# Patient Record
Sex: Male | Born: 1960
Health system: Southern US, Community
[De-identification: ages and names within clinical notes are randomized; demographics above are authoritative.]

## PROBLEM LIST (undated history)

## (undated) DIAGNOSIS — R112 Nausea with vomiting, unspecified: Secondary | ICD-10-CM

## (undated) DIAGNOSIS — Z86718 Personal history of other venous thrombosis and embolism: Secondary | ICD-10-CM

## (undated) DIAGNOSIS — T8859XA Other complications of anesthesia, initial encounter: Secondary | ICD-10-CM

## (undated) DIAGNOSIS — M4310 Spondylolisthesis, site unspecified: Secondary | ICD-10-CM

## (undated) DIAGNOSIS — M549 Dorsalgia, unspecified: Secondary | ICD-10-CM

## (undated) DIAGNOSIS — G43909 Migraine, unspecified, not intractable, without status migrainosus: Secondary | ICD-10-CM

## (undated) DIAGNOSIS — E669 Obesity, unspecified: Secondary | ICD-10-CM

## (undated) DIAGNOSIS — Z9889 Other specified postprocedural states: Secondary | ICD-10-CM

## (undated) DIAGNOSIS — M5410 Radiculopathy, site unspecified: Secondary | ICD-10-CM

## (undated) DIAGNOSIS — F32A Depression, unspecified: Secondary | ICD-10-CM

## (undated) DIAGNOSIS — I10 Essential (primary) hypertension: Secondary | ICD-10-CM

## (undated) DIAGNOSIS — G4730 Sleep apnea, unspecified: Secondary | ICD-10-CM

## (undated) DIAGNOSIS — F329 Major depressive disorder, single episode, unspecified: Secondary | ICD-10-CM

## (undated) HISTORY — DX: Personal history of other venous thrombosis and embolism: Z86.718

## (undated) HISTORY — PX: KNEE SURGERY: SHX244

## (undated) HISTORY — PX: HERNIA REPAIR: SHX51

## (undated) HISTORY — DX: Sleep apnea, unspecified: G47.30

## (undated) HISTORY — DX: Obesity, unspecified: E66.9

## (undated) HISTORY — DX: Spondylolisthesis, site unspecified: M43.10

## (undated) HISTORY — DX: Radiculopathy, site unspecified: M54.10

## (undated) HISTORY — DX: Dorsalgia, unspecified: M54.9

## (undated) HISTORY — PX: APPENDECTOMY: SHX54

---

## 2001-06-21 ENCOUNTER — Emergency Department (HOSPITAL_COMMUNITY): Admission: EM | Admit: 2001-06-21 | Discharge: 2001-06-21 | Payer: Self-pay | Admitting: Emergency Medicine

## 2001-06-21 ENCOUNTER — Encounter: Payer: Self-pay | Admitting: Emergency Medicine

## 2001-06-23 ENCOUNTER — Emergency Department (HOSPITAL_COMMUNITY): Admission: EM | Admit: 2001-06-23 | Discharge: 2001-06-23 | Payer: Self-pay | Admitting: *Deleted

## 2001-06-23 ENCOUNTER — Encounter: Payer: Self-pay | Admitting: *Deleted

## 2001-07-04 ENCOUNTER — Ambulatory Visit (HOSPITAL_COMMUNITY): Admission: RE | Admit: 2001-07-04 | Discharge: 2001-07-04 | Payer: Self-pay | Admitting: Family Medicine

## 2001-07-04 ENCOUNTER — Encounter: Payer: Self-pay | Admitting: Family Medicine

## 2001-07-08 ENCOUNTER — Inpatient Hospital Stay (HOSPITAL_COMMUNITY): Admission: RE | Admit: 2001-07-08 | Discharge: 2001-07-14 | Payer: Self-pay | Admitting: Family Medicine

## 2001-07-11 ENCOUNTER — Encounter: Payer: Self-pay | Admitting: Internal Medicine

## 2001-07-11 ENCOUNTER — Encounter: Payer: Self-pay | Admitting: Family Medicine

## 2001-07-24 ENCOUNTER — Encounter (HOSPITAL_COMMUNITY): Admission: RE | Admit: 2001-07-24 | Discharge: 2001-07-24 | Payer: Self-pay | Admitting: Family Medicine

## 2005-01-05 ENCOUNTER — Inpatient Hospital Stay (HOSPITAL_COMMUNITY): Admission: EM | Admit: 2005-01-05 | Discharge: 2005-01-09 | Payer: Self-pay | Admitting: Emergency Medicine

## 2006-03-18 ENCOUNTER — Inpatient Hospital Stay (HOSPITAL_COMMUNITY): Admission: RE | Admit: 2006-03-18 | Discharge: 2006-03-21 | Payer: Self-pay | Admitting: Preventative Medicine

## 2006-03-24 ENCOUNTER — Encounter (INDEPENDENT_AMBULATORY_CARE_PROVIDER_SITE_OTHER): Payer: Self-pay | Admitting: Family Medicine

## 2006-03-24 ENCOUNTER — Ambulatory Visit: Payer: Self-pay | Admitting: *Deleted

## 2006-03-31 ENCOUNTER — Ambulatory Visit: Payer: Self-pay | Admitting: *Deleted

## 2006-04-15 ENCOUNTER — Ambulatory Visit: Payer: Self-pay | Admitting: *Deleted

## 2006-05-16 ENCOUNTER — Ambulatory Visit: Payer: Self-pay | Admitting: *Deleted

## 2006-06-15 ENCOUNTER — Ambulatory Visit: Payer: Self-pay | Admitting: *Deleted

## 2006-07-18 ENCOUNTER — Encounter (INDEPENDENT_AMBULATORY_CARE_PROVIDER_SITE_OTHER): Payer: Self-pay | Admitting: Family Medicine

## 2006-07-18 LAB — CONVERTED CEMR LAB: TSH: 1.95 microintl units/mL

## 2006-07-19 ENCOUNTER — Ambulatory Visit: Payer: Self-pay | Admitting: Cardiology

## 2006-07-26 ENCOUNTER — Ambulatory Visit: Payer: Self-pay | Admitting: Family Medicine

## 2006-07-27 ENCOUNTER — Encounter (INDEPENDENT_AMBULATORY_CARE_PROVIDER_SITE_OTHER): Payer: Self-pay | Admitting: Family Medicine

## 2006-07-27 LAB — CONVERTED CEMR LAB: WBC, blood: 7.2 10*3/uL

## 2006-08-11 ENCOUNTER — Ambulatory Visit: Payer: Self-pay | Admitting: Family Medicine

## 2006-08-11 ENCOUNTER — Ambulatory Visit: Payer: Self-pay | Admitting: Cardiology

## 2006-08-12 ENCOUNTER — Encounter (INDEPENDENT_AMBULATORY_CARE_PROVIDER_SITE_OTHER): Payer: Self-pay | Admitting: Family Medicine

## 2006-08-25 ENCOUNTER — Ambulatory Visit: Payer: Self-pay | Admitting: Family Medicine

## 2006-09-13 ENCOUNTER — Ambulatory Visit: Payer: Self-pay | Admitting: Cardiovascular Disease

## 2006-09-22 ENCOUNTER — Encounter: Payer: Self-pay | Admitting: Family Medicine

## 2006-09-22 DIAGNOSIS — E669 Obesity, unspecified: Secondary | ICD-10-CM

## 2006-09-22 DIAGNOSIS — Z86718 Personal history of other venous thrombosis and embolism: Secondary | ICD-10-CM

## 2006-09-22 DIAGNOSIS — F329 Major depressive disorder, single episode, unspecified: Secondary | ICD-10-CM

## 2006-09-22 DIAGNOSIS — I10 Essential (primary) hypertension: Secondary | ICD-10-CM

## 2006-09-27 ENCOUNTER — Encounter (INDEPENDENT_AMBULATORY_CARE_PROVIDER_SITE_OTHER): Payer: Self-pay | Admitting: Family Medicine

## 2006-10-18 DIAGNOSIS — Z86718 Personal history of other venous thrombosis and embolism: Secondary | ICD-10-CM

## 2006-10-18 HISTORY — DX: Personal history of other venous thrombosis and embolism: Z86.718

## 2006-10-26 ENCOUNTER — Ambulatory Visit: Payer: Self-pay | Admitting: Family Medicine

## 2006-10-26 ENCOUNTER — Ambulatory Visit: Payer: Self-pay | Admitting: Cardiovascular Disease

## 2006-11-16 ENCOUNTER — Ambulatory Visit: Payer: Self-pay | Admitting: Cardiology

## 2006-12-07 ENCOUNTER — Ambulatory Visit: Payer: Self-pay | Admitting: Family Medicine

## 2006-12-07 LAB — CONVERTED CEMR LAB: Hemoglobin: 14.4 g/dL

## 2006-12-17 ENCOUNTER — Encounter (INDEPENDENT_AMBULATORY_CARE_PROVIDER_SITE_OTHER): Payer: Self-pay | Admitting: Family Medicine

## 2006-12-17 ENCOUNTER — Ambulatory Visit: Admission: RE | Admit: 2006-12-17 | Discharge: 2006-12-17 | Payer: Self-pay | Admitting: Family Medicine

## 2006-12-22 ENCOUNTER — Ambulatory Visit: Payer: Self-pay | Admitting: Pulmonary Disease

## 2006-12-27 ENCOUNTER — Encounter (INDEPENDENT_AMBULATORY_CARE_PROVIDER_SITE_OTHER): Payer: Self-pay | Admitting: Family Medicine

## 2007-01-04 ENCOUNTER — Ambulatory Visit: Payer: Self-pay | Admitting: Family Medicine

## 2007-01-18 ENCOUNTER — Ambulatory Visit: Payer: Self-pay | Admitting: Family Medicine

## 2007-01-18 DIAGNOSIS — G4733 Obstructive sleep apnea (adult) (pediatric): Secondary | ICD-10-CM

## 2007-01-20 ENCOUNTER — Encounter (INDEPENDENT_AMBULATORY_CARE_PROVIDER_SITE_OTHER): Payer: Self-pay | Admitting: Family Medicine

## 2007-02-08 ENCOUNTER — Telehealth (INDEPENDENT_AMBULATORY_CARE_PROVIDER_SITE_OTHER): Payer: Self-pay | Admitting: *Deleted

## 2007-02-22 ENCOUNTER — Ambulatory Visit: Payer: Self-pay | Admitting: Cardiovascular Disease

## 2007-02-22 ENCOUNTER — Telehealth (INDEPENDENT_AMBULATORY_CARE_PROVIDER_SITE_OTHER): Payer: Self-pay | Admitting: Family Medicine

## 2007-02-28 ENCOUNTER — Encounter (INDEPENDENT_AMBULATORY_CARE_PROVIDER_SITE_OTHER): Payer: Self-pay | Admitting: Family Medicine

## 2007-03-07 ENCOUNTER — Ambulatory Visit: Payer: Self-pay | Admitting: Family Medicine

## 2007-03-27 ENCOUNTER — Ambulatory Visit: Payer: Self-pay | Admitting: Internal Medicine

## 2007-03-28 ENCOUNTER — Encounter (INDEPENDENT_AMBULATORY_CARE_PROVIDER_SITE_OTHER): Payer: Self-pay | Admitting: Family Medicine

## 2007-04-18 ENCOUNTER — Ambulatory Visit: Payer: Self-pay | Admitting: Family Medicine

## 2007-04-19 ENCOUNTER — Ambulatory Visit (HOSPITAL_COMMUNITY): Admission: RE | Admit: 2007-04-19 | Discharge: 2007-04-19 | Payer: Self-pay | Admitting: Family Medicine

## 2007-04-19 ENCOUNTER — Encounter (INDEPENDENT_AMBULATORY_CARE_PROVIDER_SITE_OTHER): Payer: Self-pay | Admitting: Family Medicine

## 2007-04-20 ENCOUNTER — Telehealth (INDEPENDENT_AMBULATORY_CARE_PROVIDER_SITE_OTHER): Payer: Self-pay | Admitting: *Deleted

## 2007-04-20 LAB — CONVERTED CEMR LAB
ALT: 14 units/L (ref 0–53)
Alkaline Phosphatase: 80 units/L (ref 39–117)
BUN: 16 mg/dL (ref 6–23)
Cholesterol: 196 mg/dL (ref 0–200)
Glucose, Bld: 105 mg/dL — ABNORMAL HIGH (ref 70–99)
LDL Cholesterol: 145 mg/dL — ABNORMAL HIGH (ref 0–99)
Sodium: 141 meq/L (ref 135–145)
Total CHOL/HDL Ratio: 5.3
Total Protein: 6.5 g/dL (ref 6.0–8.3)
Triglycerides: 72 mg/dL (ref <150)
VLDL: 14 mg/dL (ref 0–40)

## 2007-04-26 ENCOUNTER — Encounter (INDEPENDENT_AMBULATORY_CARE_PROVIDER_SITE_OTHER): Payer: Self-pay | Admitting: Family Medicine

## 2007-05-01 ENCOUNTER — Ambulatory Visit: Payer: Self-pay | Admitting: Family Medicine

## 2007-05-01 ENCOUNTER — Ambulatory Visit: Payer: Self-pay | Admitting: Cardiology

## 2007-05-03 ENCOUNTER — Ambulatory Visit: Payer: Self-pay | Admitting: Family Medicine

## 2007-05-03 DIAGNOSIS — E785 Hyperlipidemia, unspecified: Secondary | ICD-10-CM | POA: Insufficient documentation

## 2007-05-03 DIAGNOSIS — F329 Major depressive disorder, single episode, unspecified: Secondary | ICD-10-CM

## 2007-05-05 ENCOUNTER — Ambulatory Visit: Payer: Self-pay | Admitting: Family Medicine

## 2007-05-06 ENCOUNTER — Encounter (INDEPENDENT_AMBULATORY_CARE_PROVIDER_SITE_OTHER): Payer: Self-pay | Admitting: Family Medicine

## 2007-05-08 ENCOUNTER — Ambulatory Visit: Payer: Self-pay | Admitting: Family Medicine

## 2007-05-10 ENCOUNTER — Telehealth (INDEPENDENT_AMBULATORY_CARE_PROVIDER_SITE_OTHER): Payer: Self-pay | Admitting: Family Medicine

## 2007-05-11 ENCOUNTER — Encounter (INDEPENDENT_AMBULATORY_CARE_PROVIDER_SITE_OTHER): Payer: Self-pay | Admitting: Family Medicine

## 2007-05-11 ENCOUNTER — Ambulatory Visit: Payer: Self-pay | Admitting: Cardiology

## 2007-05-12 ENCOUNTER — Encounter (INDEPENDENT_AMBULATORY_CARE_PROVIDER_SITE_OTHER): Payer: Self-pay | Admitting: Family Medicine

## 2007-05-12 ENCOUNTER — Ambulatory Visit (HOSPITAL_COMMUNITY): Admission: RE | Admit: 2007-05-12 | Discharge: 2007-05-12 | Payer: Self-pay | Admitting: Family Medicine

## 2007-05-15 ENCOUNTER — Ambulatory Visit: Payer: Self-pay | Admitting: Family Medicine

## 2007-05-16 ENCOUNTER — Encounter (INDEPENDENT_AMBULATORY_CARE_PROVIDER_SITE_OTHER): Payer: Self-pay | Admitting: Family Medicine

## 2007-05-23 ENCOUNTER — Telehealth (INDEPENDENT_AMBULATORY_CARE_PROVIDER_SITE_OTHER): Payer: Self-pay | Admitting: *Deleted

## 2007-05-23 ENCOUNTER — Ambulatory Visit: Payer: Self-pay | Admitting: Family Medicine

## 2007-05-25 ENCOUNTER — Ambulatory Visit: Payer: Self-pay | Admitting: Cardiology

## 2007-05-30 ENCOUNTER — Encounter (INDEPENDENT_AMBULATORY_CARE_PROVIDER_SITE_OTHER): Payer: Self-pay | Admitting: Family Medicine

## 2007-06-05 ENCOUNTER — Encounter (INDEPENDENT_AMBULATORY_CARE_PROVIDER_SITE_OTHER): Payer: Self-pay | Admitting: Family Medicine

## 2007-06-09 ENCOUNTER — Ambulatory Visit (HOSPITAL_COMMUNITY): Admission: RE | Admit: 2007-06-09 | Discharge: 2007-06-09 | Payer: Self-pay | Admitting: Unknown Physician Specialty

## 2007-06-09 ENCOUNTER — Ambulatory Visit: Payer: Self-pay | Admitting: Cardiology

## 2007-06-09 ENCOUNTER — Encounter (INDEPENDENT_AMBULATORY_CARE_PROVIDER_SITE_OTHER): Payer: Self-pay | Admitting: Family Medicine

## 2007-06-13 ENCOUNTER — Telehealth (INDEPENDENT_AMBULATORY_CARE_PROVIDER_SITE_OTHER): Payer: Self-pay | Admitting: Family Medicine

## 2007-06-13 ENCOUNTER — Ambulatory Visit (HOSPITAL_COMMUNITY): Payer: Self-pay | Admitting: Psychiatry

## 2007-06-15 ENCOUNTER — Encounter (INDEPENDENT_AMBULATORY_CARE_PROVIDER_SITE_OTHER): Payer: Self-pay | Admitting: *Deleted

## 2007-06-15 ENCOUNTER — Telehealth (INDEPENDENT_AMBULATORY_CARE_PROVIDER_SITE_OTHER): Payer: Self-pay | Admitting: Family Medicine

## 2007-06-21 ENCOUNTER — Encounter (INDEPENDENT_AMBULATORY_CARE_PROVIDER_SITE_OTHER): Payer: Self-pay | Admitting: Family Medicine

## 2007-06-22 ENCOUNTER — Ambulatory Visit: Payer: Self-pay | Admitting: Cardiology

## 2007-06-27 ENCOUNTER — Encounter (INDEPENDENT_AMBULATORY_CARE_PROVIDER_SITE_OTHER): Payer: Self-pay | Admitting: Family Medicine

## 2007-06-28 ENCOUNTER — Ambulatory Visit: Payer: Self-pay | Admitting: Family Medicine

## 2007-07-04 ENCOUNTER — Ambulatory Visit: Payer: Self-pay | Admitting: Family Medicine

## 2007-11-01 ENCOUNTER — Encounter (INDEPENDENT_AMBULATORY_CARE_PROVIDER_SITE_OTHER): Payer: Self-pay | Admitting: Family Medicine

## 2007-12-27 ENCOUNTER — Ambulatory Visit: Payer: Self-pay | Admitting: Family Medicine

## 2007-12-27 ENCOUNTER — Telehealth (INDEPENDENT_AMBULATORY_CARE_PROVIDER_SITE_OTHER): Payer: Self-pay | Admitting: Family Medicine

## 2007-12-27 ENCOUNTER — Ambulatory Visit (HOSPITAL_COMMUNITY): Admission: RE | Admit: 2007-12-27 | Discharge: 2007-12-27 | Payer: Self-pay | Admitting: Family Medicine

## 2007-12-27 ENCOUNTER — Telehealth (INDEPENDENT_AMBULATORY_CARE_PROVIDER_SITE_OTHER): Payer: Self-pay | Admitting: *Deleted

## 2007-12-28 ENCOUNTER — Telehealth (INDEPENDENT_AMBULATORY_CARE_PROVIDER_SITE_OTHER): Payer: Self-pay | Admitting: *Deleted

## 2007-12-29 ENCOUNTER — Ambulatory Visit: Payer: Self-pay | Admitting: Family Medicine

## 2007-12-29 DIAGNOSIS — R718 Other abnormality of red blood cells: Secondary | ICD-10-CM | POA: Insufficient documentation

## 2007-12-30 ENCOUNTER — Encounter (INDEPENDENT_AMBULATORY_CARE_PROVIDER_SITE_OTHER): Payer: Self-pay | Admitting: Family Medicine

## 2007-12-30 LAB — CONVERTED CEMR LAB: Saturation Ratios: 15 % — ABNORMAL LOW (ref 20–55)

## 2008-01-01 ENCOUNTER — Telehealth (INDEPENDENT_AMBULATORY_CARE_PROVIDER_SITE_OTHER): Payer: Self-pay | Admitting: *Deleted

## 2008-01-02 ENCOUNTER — Encounter (INDEPENDENT_AMBULATORY_CARE_PROVIDER_SITE_OTHER): Payer: Self-pay | Admitting: Family Medicine

## 2008-01-03 ENCOUNTER — Telehealth (INDEPENDENT_AMBULATORY_CARE_PROVIDER_SITE_OTHER): Payer: Self-pay | Admitting: *Deleted

## 2008-01-12 ENCOUNTER — Ambulatory Visit: Payer: Self-pay | Admitting: Family Medicine

## 2008-01-17 ENCOUNTER — Encounter (INDEPENDENT_AMBULATORY_CARE_PROVIDER_SITE_OTHER): Payer: Self-pay | Admitting: Family Medicine

## 2008-01-29 ENCOUNTER — Encounter (INDEPENDENT_AMBULATORY_CARE_PROVIDER_SITE_OTHER): Payer: Self-pay | Admitting: Family Medicine

## 2008-01-30 ENCOUNTER — Encounter: Payer: Self-pay | Admitting: Orthopedic Surgery

## 2008-02-01 ENCOUNTER — Telehealth (INDEPENDENT_AMBULATORY_CARE_PROVIDER_SITE_OTHER): Payer: Self-pay | Admitting: Family Medicine

## 2008-02-23 ENCOUNTER — Encounter (INDEPENDENT_AMBULATORY_CARE_PROVIDER_SITE_OTHER): Payer: Self-pay | Admitting: Family Medicine

## 2008-03-08 ENCOUNTER — Telehealth (INDEPENDENT_AMBULATORY_CARE_PROVIDER_SITE_OTHER): Payer: Self-pay | Admitting: Family Medicine

## 2008-03-26 ENCOUNTER — Telehealth (INDEPENDENT_AMBULATORY_CARE_PROVIDER_SITE_OTHER): Payer: Self-pay | Admitting: *Deleted

## 2008-03-26 ENCOUNTER — Ambulatory Visit: Payer: Self-pay | Admitting: Family Medicine

## 2008-03-28 ENCOUNTER — Encounter (INDEPENDENT_AMBULATORY_CARE_PROVIDER_SITE_OTHER): Payer: Self-pay | Admitting: Family Medicine

## 2008-04-15 ENCOUNTER — Observation Stay (HOSPITAL_COMMUNITY): Admission: EM | Admit: 2008-04-15 | Discharge: 2008-04-17 | Payer: Self-pay | Admitting: Emergency Medicine

## 2008-04-15 ENCOUNTER — Encounter: Payer: Self-pay | Admitting: Orthopedic Surgery

## 2008-04-16 ENCOUNTER — Encounter: Payer: Self-pay | Admitting: Orthopedic Surgery

## 2008-04-17 ENCOUNTER — Ambulatory Visit: Payer: Self-pay | Admitting: Orthopedic Surgery

## 2008-04-17 ENCOUNTER — Telehealth (INDEPENDENT_AMBULATORY_CARE_PROVIDER_SITE_OTHER): Payer: Self-pay | Admitting: Family Medicine

## 2008-04-20 ENCOUNTER — Encounter: Payer: Self-pay | Admitting: Orthopedic Surgery

## 2008-04-23 ENCOUNTER — Ambulatory Visit: Payer: Self-pay | Admitting: Family Medicine

## 2008-04-24 ENCOUNTER — Ambulatory Visit: Payer: Self-pay | Admitting: Orthopedic Surgery

## 2008-04-25 ENCOUNTER — Encounter: Payer: Self-pay | Admitting: Orthopedic Surgery

## 2008-05-01 ENCOUNTER — Ambulatory Visit: Payer: Self-pay | Admitting: Orthopedic Surgery

## 2008-05-02 ENCOUNTER — Telehealth: Payer: Self-pay | Admitting: Orthopedic Surgery

## 2008-05-02 ENCOUNTER — Encounter: Payer: Self-pay | Admitting: Orthopedic Surgery

## 2008-05-08 ENCOUNTER — Ambulatory Visit (HOSPITAL_COMMUNITY): Admission: RE | Admit: 2008-05-08 | Discharge: 2008-05-08 | Payer: Self-pay | Admitting: Orthopedic Surgery

## 2008-05-15 ENCOUNTER — Ambulatory Visit: Payer: Self-pay | Admitting: Orthopedic Surgery

## 2008-05-17 ENCOUNTER — Encounter: Payer: Self-pay | Admitting: Orthopedic Surgery

## 2008-05-21 ENCOUNTER — Ambulatory Visit: Payer: Self-pay | Admitting: Family Medicine

## 2008-06-04 ENCOUNTER — Telehealth: Payer: Self-pay | Admitting: Orthopedic Surgery

## 2008-06-11 ENCOUNTER — Encounter: Payer: Self-pay | Admitting: Orthopedic Surgery

## 2008-06-12 ENCOUNTER — Ambulatory Visit: Payer: Self-pay | Admitting: Orthopedic Surgery

## 2008-06-17 ENCOUNTER — Encounter: Payer: Self-pay | Admitting: Orthopedic Surgery

## 2008-06-26 ENCOUNTER — Ambulatory Visit: Payer: Self-pay | Admitting: Family Medicine

## 2008-08-22 ENCOUNTER — Ambulatory Visit: Payer: Self-pay | Admitting: Family Medicine

## 2008-08-28 DIAGNOSIS — I82409 Acute embolism and thrombosis of unspecified deep veins of unspecified lower extremity: Secondary | ICD-10-CM | POA: Insufficient documentation

## 2008-08-28 DIAGNOSIS — F17200 Nicotine dependence, unspecified, uncomplicated: Secondary | ICD-10-CM | POA: Insufficient documentation

## 2008-09-19 ENCOUNTER — Telehealth (INDEPENDENT_AMBULATORY_CARE_PROVIDER_SITE_OTHER): Payer: Self-pay | Admitting: *Deleted

## 2008-11-21 ENCOUNTER — Ambulatory Visit: Payer: Self-pay | Admitting: Family Medicine

## 2009-01-08 ENCOUNTER — Ambulatory Visit: Payer: Self-pay | Admitting: Internal Medicine

## 2009-01-09 ENCOUNTER — Ambulatory Visit: Payer: Self-pay | Admitting: Family Medicine

## 2009-01-09 DIAGNOSIS — R55 Syncope and collapse: Secondary | ICD-10-CM

## 2009-01-10 ENCOUNTER — Ambulatory Visit (HOSPITAL_COMMUNITY): Admission: RE | Admit: 2009-01-10 | Discharge: 2009-01-10 | Payer: Self-pay | Admitting: Family Medicine

## 2009-01-13 ENCOUNTER — Ambulatory Visit: Payer: Self-pay | Admitting: Family Medicine

## 2009-01-14 ENCOUNTER — Telehealth (INDEPENDENT_AMBULATORY_CARE_PROVIDER_SITE_OTHER): Payer: Self-pay | Admitting: Family Medicine

## 2009-01-14 ENCOUNTER — Encounter (INDEPENDENT_AMBULATORY_CARE_PROVIDER_SITE_OTHER): Payer: Self-pay | Admitting: Family Medicine

## 2009-01-15 ENCOUNTER — Encounter (INDEPENDENT_AMBULATORY_CARE_PROVIDER_SITE_OTHER): Payer: Self-pay | Admitting: Family Medicine

## 2009-01-15 ENCOUNTER — Telehealth (INDEPENDENT_AMBULATORY_CARE_PROVIDER_SITE_OTHER): Payer: Self-pay | Admitting: Family Medicine

## 2009-01-16 ENCOUNTER — Encounter (INDEPENDENT_AMBULATORY_CARE_PROVIDER_SITE_OTHER): Payer: Self-pay | Admitting: Family Medicine

## 2009-01-16 ENCOUNTER — Telehealth (INDEPENDENT_AMBULATORY_CARE_PROVIDER_SITE_OTHER): Payer: Self-pay | Admitting: Family Medicine

## 2009-01-20 ENCOUNTER — Encounter (INDEPENDENT_AMBULATORY_CARE_PROVIDER_SITE_OTHER): Payer: Self-pay | Admitting: Family Medicine

## 2009-01-21 ENCOUNTER — Telehealth (INDEPENDENT_AMBULATORY_CARE_PROVIDER_SITE_OTHER): Payer: Self-pay | Admitting: *Deleted

## 2009-01-22 ENCOUNTER — Encounter (INDEPENDENT_AMBULATORY_CARE_PROVIDER_SITE_OTHER): Payer: Self-pay | Admitting: Family Medicine

## 2009-01-23 ENCOUNTER — Encounter (INDEPENDENT_AMBULATORY_CARE_PROVIDER_SITE_OTHER): Payer: Self-pay | Admitting: Family Medicine

## 2009-03-21 ENCOUNTER — Encounter (INDEPENDENT_AMBULATORY_CARE_PROVIDER_SITE_OTHER): Payer: Self-pay | Admitting: Family Medicine

## 2009-07-09 ENCOUNTER — Encounter: Payer: Self-pay | Admitting: Cardiology

## 2009-12-29 ENCOUNTER — Ambulatory Visit (HOSPITAL_COMMUNITY): Admission: RE | Admit: 2009-12-29 | Discharge: 2009-12-29 | Payer: Self-pay | Admitting: Family Medicine

## 2010-01-20 ENCOUNTER — Encounter (HOSPITAL_COMMUNITY): Admission: RE | Admit: 2010-01-20 | Discharge: 2010-02-19 | Payer: Self-pay | Admitting: Podiatry

## 2010-06-19 ENCOUNTER — Ambulatory Visit: Admission: RE | Admit: 2010-06-19 | Discharge: 2010-06-19 | Payer: Self-pay | Admitting: Family Medicine

## 2010-11-15 LAB — CONVERTED CEMR LAB
Albumin: 4.1 g/dL (ref 3.5–5.2)
Alkaline Phosphatase: 100 units/L (ref 39–117)
Amylase: 42 units/L (ref 27–131)
Basophils Absolute: 0 10*3/uL (ref 0.0–0.1)
Basophils Relative: 0 % (ref 0–1)
Bilirubin Urine: NEGATIVE
CO2: 26 meq/L (ref 19–32)
Cholesterol: 197 mg/dL (ref 0–200)
Eosinophils Absolute: 0 10*3/uL (ref 0.0–0.7)
Glucose, Bld: 120 mg/dL — ABNORMAL HIGH (ref 70–99)
Glucose, Urine, Semiquant: NEGATIVE
HDL goal, serum: 40 mg/dL
Hemoglobin: 14.4 g/dL (ref 13.0–17.0)
LDL Cholesterol: 144 mg/dL — ABNORMAL HIGH (ref 0–99)
LDL Goal: 130 mg/dL
Lymphocytes Relative: 25 % (ref 12–46)
MCV: 70.5 fL — ABNORMAL LOW (ref 78.0–100.0)
Monocytes Absolute: 0.5 10*3/uL (ref 0.1–1.0)
Monocytes Relative: 7 % (ref 3–12)
Platelets: 223 10*3/uL (ref 150–400)
Potassium: 4.2 meq/L (ref 3.5–5.3)
Specific Gravity, Urine: 1.025
Total CHOL/HDL Ratio: 5.2
Triglycerides: 76 mg/dL (ref <150)
VLDL: 15 mg/dL (ref 0–40)
WBC Urine, dipstick: NEGATIVE
WBC: 6.4 10*3/uL (ref 4.0–10.5)

## 2010-11-19 NOTE — Letter (Signed)
Summary: Attending physicans statement  Attending physicans statement   Imported By: Konrad Dolores 06/27/2007 14:01:48  _____________________________________________________________________  External Attachment:    Type:   Image     Comment:   External Document

## 2011-03-02 NOTE — H&P (Signed)
[name], [name]               ACCOUNT NO.:  [id number]   MEDICAL RECORD NO.:  [id number]          PATIENT TYPE:  INP   LOCATION:  A309                          FACILITY:  [facility]   PHYSICIAN:  Salem Caster, DO    DATE OF BIRTH:  01-25-1961   DATE OF ADMISSION:  04/15/2008  DATE OF DISCHARGE:  LH                              HISTORY & PHYSICAL   PRIMARY CARE PHYSICIAN:  Weston Settle, M.D.   CHIEF COMPLAINT:  Right arm pain.   HISTORY OF THE PRESENT ILLNESS:  This is a 50 year old African-American  male who presents with the complaint of right arm pain.  The patient  states that he has a history of DVT in his right upper arm over a year  ago after being seen at [facility] for a migraine.  The patient states he had  an IV placed in that arm and developed a DVT in that arm.  The patient  was placed on Coumadin, that he states, for almost a year and now he has  been off Coumadin for approximately eight months.  The patient states  that he developed pain above the elbow that radiates all the way down to  his wrist area.  He states this is on the medial aspect of his arm and  got progressively worse.  The patient states the pain was 10/10 upon  admission and after pain M.D. he states it is approximately a 6/10.  The  patient also admits to some mild tingling in his right fingers.  The  patient denies any swelling in that extremity at this time.   PAST MEDICAL HISTORY:  The patient's past medical history includes:  1. Hypertension.  2. Migraine headaches.  3. Right arm DVT.   PAST SURGICAL HISTORY:  1. Appendectomy.  2. Left hernia repair.  3. Right knee surgery.   ALLERGIES:  Drug allergies are none.   SOCIAL HISTORY:  The patient is a half-pack per day smoker since 89.  Denies any alcohol or drug abuse.   FAMILY HISTORY:  The family history is positive for cancer, stroke,  myocardial infarction and diabetes.   MEDICATIONS:  The patient's home medications include:  1.  Cardizem 240 mg daily.  2. Alprazolam 0.5 mg at bedtime.  3. Unknown migraine medication.   REVIEW OF SYSTEMS:  CONSTITUTIONAL:  Negative for fever, chills, weight  loss and weight gain.  HEENT:  Unremarkable.  CARDIOVASCULAR:  No chest  pain or palpitations.  RESPIRATORY:  No cough, dyspnea or wheezing.  GASTROINTESTINAL:  No nausea, vomiting, diarrhea or abdominal pain.  GENITOURINARY:  No dysuria or flank pain.  MUSCULOSKELETAL:  Positive  for right arm pain.  No neck or back pain.  INTEGUMENT:  No rashes or  moles.  NEUROLOGIC:  No current headache.   PHYSICAL EXAMINATION:  VITAL SIGNS:  Temperature is 98, pulse is 61,  respirations are 14, blood pressure is 134/93, and he is satting 96% on  room air.  GENERAL APPEARANCE:  The patient is well-developed, well-nourished and  well-hydrated, and in no acute distress.  The patient is complaining of  arm pain.  HEENT:  The head is atraumatic and normocephalic.  No scleral icterus.  Eyes show PERRLA.  EOMI.  NECK:  The neck is soft, supple, nontender and nondistended with no  thyromegaly.  No JVD is noted.  HEART:  Cardiovascular shows regular rate and rhythm.  No murmurs, rubs  or gallops.  CHEST AND LUNGS:  The lungs are clear to auscultation bilaterally.  No  rales, rhonchi or wheezes.  ABDOMEN:  The abdomen is soft, nontender and nondistended with positive  bowel sounds.  No rigidity or guarding.  EXTREMITIES:  The extremities have no clubbing, cyanosis or edema in his  lower extremities.  The right upper extremity has pain on palpation in  the medial aspect of his right forearm.  No obvious swelling, streaking  or redness is noted.  He does have pain on palpation as stated in the  medial aspect of the forearm.  NEUROLOGIC EXAMINATION:  Neurologically cranial nerves II-XII are  grossly intact.  The is alert and oriented times three.   LABORATORY DATA:  Sodium 140, potassium 3.9, chloride 105, BUN 15,  creatinine 1.6, and glucose  80.  Hemoglobin 16.3 and hematocrit 48.0.  CT scan was performed of his chest and arm, and no pulmonary embolus was  noted.  This was a suboptimal study for DVT of the right arm with no  secondary signs of DVT being seen, recommend ultrasound.   ASSESSMENT:  1. Right arm pain.  2. History of deep venous thrombosis in the right upper extremity.  3. History of hypertension.  4. History of migraines.   PLAN:  1. The patient is admitted to the service of [facility] to a general      medical bed.  2. For his right arm pain the patient will be placed on IV pain      medications at this time.  3. Also we will get an ultrasound of his right upper extremity in the      A.M. to rule out DVT.  4. The patient will be placed on Lovenox 1 mg/kg every 12 hours until      DVT is ruled out of his right upper extremity.  5. For his hypertension and migraine the patient will be placed on his      home medications when available.      Salem Caster, DO  Electronically Signed     SM/MEDQ  D:  04/16/2008  T:  04/16/2008  Job:  [id number]

## 2011-03-02 NOTE — Discharge Summary (Signed)
[name], [name]               ACCOUNT NO.:  [id number]   MEDICAL RECORD NO.:  [id number]          PATIENT TYPE:  OBV   LOCATION:  A309                          FACILITY:  [facility]   PHYSICIAN:  Girard Cooter, MD         DATE OF BIRTH:  1961-02-22   DATE OF ADMISSION:  04/15/2008  DATE OF DISCHARGE:  07/01/2009LH                               DISCHARGE SUMMARY   DISCHARGE DIAGNOSES:  1. Right arm pain.  2. History of deep vein thrombosis in the right arm.  3. Deep vein thrombosis ruled out.  4. Hypertension.  5. History of migraines.  6. Likely tennis elbow per orthopedic consultation.   CONSULTANTS:  Dr. Arther Abbott, orthopedic surgical services.   PROCEDURES:  1. The patient had a CT angio on April 15, 2008 which showed no      evidence of pulmonary embolism.  Suboptimal evaluation of right arm      DVT due to poor venous opacification.  No secondary signs of acute      DVT noted.  Nonemergent right upper extremity venous Doppler      ultrasound recommend for further evaluation.  2. The patient had an ultrasound venous imaging of the upper extremity      which showed no evidence of DVT in the right upper extremity,      questionable mild elevation of the right heart pressure.  The      patient had an x-ray of the elbow April 16, 2008, which was      negative.   HISTORY OF PRESENT ILLNESS:  [name] is a pleasant 50 year old African  American who presented to [facility] on April 14, 2008  complaining of severe right arm pain.  He was concerned because he also  had a DVT in that arm in the past.  He has been status post Coumadin  times 6 months.  Apparently, he had been off of Coumadin for 8 months.  Apparently his elbow pain radiated all the way down to his wrists, 10/10  in intensity.  He was admitted to the medical telemetry unit.  Orthopedic consultation was requested and upper extremity ultrasound was  requested to rule out DVT.  Orthopedic consultation was  requested with  the following recommendations:  Recommendations from Dr. Aline Brochure  include diagnosis:  Golfer's elbow plus carpal tunnel in the right upper  extremity.   RECOMMENDATION:  1. Ice 3 times a day times 20 minutes.  2. Tennis elbow brace.  3. Neurontin 100 mg increased to 300 mg 3 times a day as tolerated.  4. Endocet 250 mg t.i.d. times 10 days.  5. Follow up with Dr. Aline Brochure in 7 days.  6. Out of work June 29 to July 13.  Note that on the discharge pink      form I will put Dr. Ruthe Mannan name and the following instructions      that I just listed.   DISCHARGE MEDICATIONS:  1. Per recommendations, we will go ahead and increase his Neurontin to      300 mg 3 times  a day, I will give him a 15-day supply.  2. Endocet 250 mg t.i.d. times 10 days.  3. He will continue his preadmission medications including Cardizem      120 mg nightly.  4. Maxalt  10 mg as needed.   FOLLOWUP:  As above in 7 days with Dr. Aline Brochure.  Appointment will be  made as I will request from the secretaries.  Also the patient advised  follow up with his primary care doctor in 2 weeks or as needed, by Dr.  Weston Settle.  The patient was advised to call with any chest  pain, shortness of breath.   DISCHARGE CONDITION:  Stable.   DISCHARGE LEVEL OF ACTIVITY:  Per orthopedic recommendations, increase  activity slowly.      Girard Cooter, MD  Electronically Signed     RR/MEDQ  D:  04/17/2008  T:  04/17/2008  Job:  [id number]

## 2011-03-02 NOTE — Procedures (Signed)
[facility]                              EXERCISE TREADMILL   [name], [name]                      MRN:          [id number]  DATE:05/25/2007                            DOB:          May 27, 1961    REFERRING PHYSICIAN:  Weston Settle, M.D.   CLINICAL DATA:  A 50 year old gentleman with chest pain.   RESULTS:  1. Treadmill exercise performed to a work load of 10 mets and a heart      rate of 176, 101% of the patient's age predicted maximum. Exercise      discontinued due to fatigue; no chest pain reported.  2. Blood pressure increased from a resting value of 165/85 to 200/90      at peak exercise and 230/95 early in recovery, a hypertensive      response.  3. No arrhythmias noted.  4. Resting EKG:  Normal sinus rhythm; borderline left atrial      abnormality; delayed R-wave progression, non-specific T-wave      abnormality.  5. Stress EKG:  1/1.5 mm of upsloping ST segment depression in the      inferior leads as well as V5-V6; nearly 1 mm of slight ST segment      depression in recovery with T-wave inversion in the same leads.   IMPRESSION:  Borderline graded exercise test with the patient achieving  an adequate work load and heart rate in the absence of angina;  predominantly upsloping ST segment depression of marginal significance;  other findings as noted.     [name]. Lattie Haw, MD, [facility]  Electronically Signed    RMR/MedQ  DD: 06/10/2007  DT: 06/11/2007  Job #: [id number]

## 2011-03-02 NOTE — Group Therapy Note (Signed)
NAMEHENRY, BERNE               ACCOUNT NO.:  [id number]   MEDICAL RECORD NO.:  [id number]          PATIENT TYPE:  OBV   LOCATION:  A309                          FACILITY:  [facility]   PHYSICIAN:  Girard Cooter, MD         DATE OF BIRTH:  24-Jul-1961   DATE OF PROCEDURE:  04/17/2008  DATE OF DISCHARGE:                                 PROGRESS NOTE   Patient examined by bedside today.  Patient's medical history and  admission data has been reviewed.  Patient was admitted back on June 29  with significant amount of right-arm pain.  Patient was actually worried  about deep vein thrombosis.  She has a history of it and apparently was  treated at [facility].  Patient was admitted and DVT was ruled out.  She is  also status post evaluation by orthopedic surgery, Dr. Aline Brochure.  There  was recommendation of ice three times a day times 20 minutes, tennis  elbow brace, Neurontin 100 mg, increased to 300 t.i.d., Indocin,  followup with Dr. Sherrye Payor in seven days.      Girard Cooter, MD  Electronically Signed     RR/MEDQ  D:  04/17/2008  T:  04/17/2008  Job:  [id number]

## 2011-03-02 NOTE — Letter (Signed)
June 09, 2007    Weston Settle, M.D.  [address], [place]  [place], [place]   RE:  [name], [name]  MRN:  [id number]  /  DOB:  1961-09-06   Dear [name]:   [name] returns to the office for continued assessment and treatment  of hypertension, and previous right arm DVT.  Since the last visit, he  has done generally well.  He has followed his blood pressure on occasion  at local pharmacies, and found variable control.  Systolics have been as  high as 0000000, and diastolics as high as A999333, but values are generally  considerably better.  He developed fatigue when he was taking a  diuretic, leading [place] to change to labetalol 200 mg b.i.d.  His other  medications are unchanged.  He continues to be followed by a neurologist  in [place] for headaches.  He travels that far because he was  dissatisfied with the specialist in [place].  He has noted not  recurrent symptoms in his arm since anticoagulants were discontinued.  He continues to smoke cigarettes at a reduced rate of 3/4 of a pack per  day.   EXAMINATION:  A pleasant gentleman in no acute distress.  The weight is 226, four pounds more than in July.  Blood pressure  125/90.  Heart rate 70 and regular.  Respirations 18.  NECK:  No jugular venous distention.  Normal carotid upstrokes without  bruits.  LUNGS:  Clear.  CARDIAC:  Normal 1st and 2nd heart sounds, 4th heart sound present.  ABDOMEN:  Soft and nontender.  No organomegaly.  EXTREMITIES:  Trace edema.   IMPRESSION:  [name] is doing generally well.  Blood pressure control  is somewhat suboptimal.  We will increase labetalol to 400 mg b.i.d.  He  is advised to discontinue cigarette smoking, and to increase his daily  activity.  Counseling regarding this was provided to him.  He will  continue to monitor home blood pressure, and keep a list for [place].  The  cardiology nurses will reassess blood pressure control in 1 month.  I  will see him  again in 4 months.    Sincerely,      Cristopher Estimable. Lattie Haw, MD, [facility]  Electronically Signed    RMR/MedQ  DD: 06/09/2007  DT: 06/10/2007  Job #: [id number]

## 2011-03-02 NOTE — Consult Note (Signed)
[name], [name]               ACCOUNT NO.:  [id number]   MEDICAL RECORD NO.:  [id number]          PATIENT TYPE:  OBV   LOCATION:  A309                          FACILITY:  [facility]   PHYSICIAN:  Carole Civil, M.D.DATE OF BIRTH:  Mar 19, 1961   DATE OF CONSULTATION:  DATE OF DISCHARGE:  04/17/2008                                 CONSULTATION   CONSULTATION REQUESTED BY:  Dr. Sherryle Lis from the [facility] Team   CHIEF COMPLAINT:  Right arm pain.   HISTORY:  A 50 year old male presented to emergency room complaining of  right arm pain.  The patient has a history of DVT in the right upper  extremity. Definitive treatment was at [facility].  Initial  complaints then were migraine headache.   The patient states that an IV was placed in his arm and he developed  deep vein thrombosis.  He was on Coumadin 2-4 year and he has now been  off for approximately 8 months.   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  He went to the emergency room and complaining 10/10 pain.  He was brought into the hospital and worked up for DVT and DVT study  ultrasound negative and x-rays negative.   PAST MEDICAL HISTORY:  1. Hypertension.  2. Migraine headaches.  3. Right arm DVT.   PREVIOUS SURGICAL HISTORY:  1. Appendectomy.  2. Left herniorrhaphy.  3. Right knee surgery.   ALLERGIES:  No drug allergies.   SOCIAL HISTORY:  The patient is a half pack per day smoker since 38.  Denies alcohol and drug abuse.  Works at [employer], Associate Professor.   FAMILY HISTORY:  Cancer, stroke, myocardial infarction, and diabetes.   MEDICATIONS:  1. Cardizem 240 mg today.  2. Alprazolam 0.5 mg at bedtime.   MIGRAINE MEDICATION:  The patient does not know the name of it.   REVIEW OF SYSTEMS:  CONSTITUTIONAL:   Negative.  CARDIOVASCULAR:  Negative.  RESPIRATORY:  Negative.  GI:  Negative.  GU:  Negative.  MUSCULOSKELETAL:  He denies neck pain.  He denies shoulder or radicular  pain from the neck to the elbow.  SKIN:  Negative.  NEUROLOGIC:  Tingling as noted.  No headache or migraine.   PHYSICAL EXAMINATION:  VITAL SIGNS:  Today's vital signs revealed  temperature 98.1, pulse 60, respiratory rate 20, and blood pressure  101/59.  GENERAL APPEARANCE:  The patient is a well-developed, well-nourished  muscular male, grooming and hygiene are normal.  Body habitus was large.  CARDIOVASCULAR:  Shows normal pulse and perfusion in the right upper  extremity.  SPINE:  Cervical spine shows no lymphadenopathy.  SKIN:  Warm, dry and intact.  No rashes or lesions.  PSYCHIATRIC:  He is awake, alert, and oriented x3.  Mood is pleasant.  NEUROLOGIC:  Decreased sensation small and ring finger.  Coordination is  normal.  MUSCULOSKELETAL:  Gait  is deferred.  Inspection of the right upper  extremity and left upper extremity shows that his flexor pronator mass  is large on both sides almost given the appearance of soft tissue mass,  but it is equal in series and nontender.  He is tender in the medial  epicondylar area superior to it and inferior to it.  He has pain with  resisted flexion of the wrist.  He has decreased finger abduction and  decreased grip strength.  Passive range of motion in the wrist is  normal.  Passive range of motion in the elbow is normal.  Elbow was  stable.   IMPRESSION:  Cubital tunnel syndrome and golfer's elbow or medial  epicondylitis   PLAN:  Nonoperative measures, which will include cryotherapy, bracing,  Neurontin, Indocin, rest, and followup with me in a week.      Carole Civil, M.D.  Electronically Signed     SEH/MEDQ  D:  04/17/2008  T:  04/17/2008  Job:  [id number]

## 2011-03-02 NOTE — Group Therapy Note (Signed)
NAMEKEVINS, JAFRI               ACCOUNT NO.:  [id number]   MEDICAL RECORD NO.:  [id number]          PATIENT TYPE:  OBV   LOCATION:  A309                          FACILITY:  [facility]   PHYSICIAN:  Anselmo Pickler, DO    DATE OF BIRTH:  [birth year]   DATE OF PROCEDURE:  04/16/2008  DATE OF DISCHARGE:                                 PROGRESS NOTE   Patient seen today still complaining of right arm pain.  Doppler has  come back and it is negative for DVT.  The patient states that the pain  is deep.  We will go ahead and obtain x-rays of the arm to make sure  that he has no bony abnormalities, as well as to ortho to consult to see  if they have any recommendations.  If none, or further testing noted;  the patient can be discharged in the morning.   OBJECTIVE:  VITAL SIGNS:  As follows temperature 97.7, pulse 65,  respirations 20, blood pressure 123/84.  GENERAL:  The patient is a 50 year old African-American male who is well-  developed, well-nourished in no acute distress.  HEART:  Regular rate and rhythm.  LUNGS:  Clear to auscultation bilaterally.  ABDOMEN:  Soft, nontender, not distended.  EXTREMITIES:  Positive tenderness in the right forearm with palpation.  Lower extremities positive pulses, no ecchymosis, edema, or cyanosis.   LABS:  White count 8.5, hemoglobin 13.4, hematocrit 41.0 and platelet  count of 206.  Sodium 142, potassium 3.9, chloride 105, CO2 31, glucose  106, BUN 15, and creatinine 1.46.   ASSESSMENT/PLAN:  1. Right forearm pain.  We have ruled out deep venous thrombosis.  We      will have ortho make suggestions to see if there is anything else      that needs to be done, or further evaluation. We will also obtain x-      rays.  2. Pain management.  We will continue with pain management for tonight      with the anticipation of discharging the patient to home.      Recommend that he follow up with his primary care physician in 3-5      days after  discharge.      Anselmo Pickler, DO  Electronically Signed     CB/MEDQ  D:  04/16/2008  T:  04/16/2008  Job:  [id number]

## 2011-03-02 NOTE — Letter (Signed)
May 11, 2007    Weston Settle, M.D.  [address], [place]  [place], [place]   RE:  SALMAN, BORTNER  MRN:  [id number]  /  DOB:  10-Apr-1961   Dear Roderic Scarce:   It was my pleasure evaluating Mr. Hon in consultation today in the  office at your request for chest discomfort.  I had previously seen this  nice gentleman 6 years ago for assessment of chest discomfort.  He  subsequently was followed by Dr. Wilhemina Cash after developing a right upper  extremity DVT.  He has been on Coumadin for approximately a year for  this problem.  He has longstanding hypertension that generally is not  well-controlled.  The patient fears the development of hypotension so he  does not take enough antihypertensive medications.  He has chronic  headaches, for which he has been seen in many practices including the  pain clinic at [facility].  He is no longer attending that clinic since he  developed his DVT after insertion of an intravenous catheter.   He now comes in with chest discomfort.  For the past 3 weeks he has  noted a localized pain of mild to moderate severity in the left anterior  chest.  There is no radiation.  There may be minimal associated dyspnea  although he notes chronic dyspnea.  There is no diaphoresis or nausea.  The pain is intermittent.  It may be exacerbated by moving around.  There has not been clear chest wall tenderness.  There is no  relationship to meals or other GI factors.   Past medical history, social history and review of systems were updated.  There have not been notable developments since he was last seen by Dr.  Wilhemina Cash a year ago.  He has some chronic discomfort in his right arm but  would not characterize this as pain.  There has been no clear swelling.  He has not had problems with anticoagulation, which has been managed in  this office.   CURRENT MEDICATIONS:  1. Lunesta 3 mg q.h.s.  2. Topamax 50 mg b.i.d.  3. Diltiazem 240 mg daily.  4.  Warfarin as directed.  5. He uses hydrocodone and Imitrex as needed for migraine, but control      has been poor.   On exam, a depressed-appearing, lethargic gentleman.  The weight is 222, stable.  Blood pressure 155/110, heart rate 80 and  regular, respirations 16.  HEENT:  Anicteric sclerae; normal lids and conjunctivae.  Normal oral  mucosa.  NECK:  No jugular venous distention; normal carotid upstrokes without  bruits.  ENDOCRINE:  No thyromegaly.  HEMATOPOIETIC:  No adenopathy.  LUNGS:  Clear.  CARDIAC:  Normal first and second heart sounds; fourth heart sound  present.  ABDOMEN:  Soft and nontender; no organomegaly.  EXTREMITIES:  Trace lower extremity edema; superficial scar over the  right forearm; both upper arms measure 31 cm 5 cm above the olecranon.  NEUROMUSCULAR:  Symmetric strength and tone; normal cranial nerves.   Recent laboratory includes a normal chemistry profile, mild dyslipidemia  with a total cholesterol of 196, HDL 37 and LDL of 145, and a sleep  study showing mild obstructive sleep apnea.   IMPRESSION:  Mr. Hayre has been anticoagulated for one year for upper  extremity deep vein thrombosis that occurred in the setting of venous  trauma.  I see no need to continue warfarin.  We will monitor him  clinically to rule out recurrent  deep vein thrombosis.   Blood pressure control is poor.  I explained the need for treatment.  He  is reluctant to take more medication, but we will start with  chlorthalidone 12.5 mg daily.  A chemistry profile will be obtained in 3  weeks.   Chest pain is atypical.  It would be worthwhile to rule out myocardial  ischemia with a stress test to be performed in the office at first  opportunity.  I will reassess this nice gentleman again in 3 week.   Thanks for sending him back to me.    Sincerely,     Cristopher Estimable. Lattie Haw, MD, [facility]  Electronically Signed   RMR/MedQ  DD: 05/11/2007  DT: 05/12/2007  Job #: [id number]

## 2011-03-05 NOTE — Discharge Summary (Signed)
[name], [name]               ACCOUNT NO.:  [id number]   MEDICAL RECORD NO.:  [id number]          PATIENT TYPE:  INP   LOCATION:  [id number]                          FACILITY:  [facility]   PHYSICIAN:  Leane Para C. Tamala [name], M.D.   DATE OF BIRTH:  10/05/1961   DATE OF ADMISSION:  01/05/2005  DATE OF DISCHARGE:  03/25/2006LH                                 DISCHARGE SUMMARY   DISCHARGE DIAGNOSES:  1.  Recurrent dizziness, etiology undetermined.  2.  Severe migraine headache.  3.  Hypertension.  4.  Osteoarthritis.  5.  Chronic anxiety disorder.   DISPOSITION:  The patient is discharged home slightly improved.  He will  need some more time off work in order to fully recover.  This was discussed  with the patient.  He is advised that he could not anticipate a complete  recovery, that he probably would have to work with some discomfort until we  can fully determine the etiology of his symptoms.   DISCHARGE MEDICATIONS:  1.  Meclizine 25 mg q.6h.  2.  Flomax 0.4 mg daily.  3.  Cardizem LA 360 mg daily.  4.  ____________q.12h.  5.  Alprazolam 0.5 mg at bedtime.  6.  Demerol 50 mg q.8-12h.   FOLLOWUP:  The patient will be seen in the office in one week.   SUMMARY:  A 50 year old male admitted for evaluation of recurrent headache  and dizziness.  He has a long history of headaches.  He was seen in the  office five days prior to admission complaining of increasing headaches.  He  had mildly elevated blood pressure with a systolic level in the [id number] to 160  range.  He was started on Cardizem LA 360 mg.  The patient had episodes of  dizziness at home.  He came back to my office where it was noted that his  blood pressure was actually better.  Other specifics of his problems are  given in the admission note.  The patient did not have any orthostatic  dizziness, he did not have any decrease in blood pressure, nor did he have  any increase in pulse rate.   PHYSICAL EXAMINATION:  VITAL SIGNS:  Blood  pressure was 120/80 standing,  130/80 laying, pulse was 58 standing and 58 laying.  NEUROLOGIC:  There were  no neurologic findings.  HEENT:  There was no anterior temporal parietal scalp tenderness.  Tympanic  membranes revealed mild serous otitis, but were intact.  CHEST:  Normal.  HEART:  Normal.  ABDOMEN:  Benign.  EXTREMITIES:  There were no focal motor, sensory, or cerebellar deficits on  neurologic examination.   HOSPITAL COURSE:  The patient was treated symptomatically.  Over the next  few days he continued to complain of a headache and dizziness.  No new  neurologic findings were noted.  He was finally treated with low dose  Imitrex which improved his headache.  Blood pressure remained stable  throughout this hospitalization.  It was felt that the headache was actually  a migraine-type headache and that we need to find an agent that would  control this.  By January 09, 2005, it was decided he was stable enough that  we could manage him at home.  He was discharged home and plans were made for  him to have followup as an outpatient.      LCS/MEDQ  D:  02/06/2005  T:  02/07/2005  Job:  [id number]

## 2011-03-05 NOTE — H&P (Signed)
[name], [name]               ACCOUNT NO.:  [id number]   MEDICAL RECORD NO.:  [id number]          PATIENT TYPE:  INP   LOCATION:  A210                          FACILITY:  [facility]   PHYSICIAN:  Audria Nine, M.D.DATE OF BIRTH:  [birth year]   DATE OF ADMISSION:  03/18/2006  DATE OF DISCHARGE:  LH                                HISTORY & PHYSICAL   PRIMARY CARE PHYSICIAN:  Leane Para C. Tamala [name], M.D.   ADMISSION DIAGNOSES:  1.  Deep venous thrombosis of the right upper arm.  2.  History of migraine headaches with recent hospitalization at Pain Clinic      at [facility].  3.  History of hypertension.  4.  Chronic anxiety disorder.   CHIEF COMPLAINT:  Swelling and pain in the right upper arm.   HISTORY OF PRESENT ILLNESS:  Mr. [name] is an African-American male  who is 50 years old who is a direct admission from [facility] by Dr.  Alwyn Ren.  He presented there with a complaint of swelling and severe pain in  his right upper arm over the past 3-4 days.  The patient gives a history of  severe intractable migraines for which he has had multiple evaluations and  treatment without much improvement.  About 2 weeks ago, he decided to go to  [facility] for help.  The patient was seen in the emergency room and  was given an appointment to follow up at the Pain Clinic.  When he went back  to the Pain Clinic, he was admitted due to severe headache for management.  The patient had imaging studies and evaluation which were negative.  He also  received some IV medications and had an IV placed in his cubital fossa.  The  patient said he had no trouble.  Around discharge, he noticed progressive  swelling and tenderness with a knob-like feeling in his right arm.  Evaluation at Urgent Care did reveal a presence of deep venous thrombosis  extending to the cephalic vein.  The patient is being admitted now for  further evaluation and management.  The patient denies any  history of prior  DVTs and denies any history of hypercoagulable state.  He has a family  history of deep venous thrombosis.  The patient believes that the IV site  has predisposed him to this.  He has never had a PICC line placed.  He  denies any fevers or chills.  He has no cough or shortness of breath.  He  has no fevers or chills.   REVIEW OF SYSTEMS:  The 10-point of review of systems is otherwise negative  except as mentioned in history of present illness.   PAST MEDICAL HISTORY:  1.  Severe migraine headaches for which he has had multiple evaluations      including a lumbar puncture which had been unremarkable.  2.  Chronic anxiety.  3.  Depression.  4.  Osteoarthritis.  5.  Chronic dizziness.   MEDICATIONS:  1.  Cardizem 240 mg p.o. once a day.  2.  Topamax 50 mg p.o. daily.  The patient is on an incremental dose which      was started  1-1/2 weeks ago by neurology at [facility].  3.  Lunesta 3 mg p.o. nightly.  4.  Alprazolam 0.5 mg at bedtime.   ALLERGIES:  No known drug allergies.   FAMILY HISTORY:  No family history of deep venous thrombosis was obtained.  No history of severe headaches.  No history of hypertension, coronary artery  disease or diabetes.   SOCIAL HISTORY:  The patient is married and has three children from his  previous wife.  He has a step-son.  He continues to smoke about a pack of  cigarettes per day for close to 35 years now.  Denies any alcohol, illicit  drug or marijuana use.   PHYSICAL EXAMINATION:  GENERAL:  Conscious, alert, comfortable in no acute  distress.  VITAL SIGNS:  Blood pressure 142/83, pulse 89, respirations 20, temperature  98.3, oxygen saturations 98% on room air.  HEENT:  Normocephalic, atraumatic.  Oral mucosa was moist with no exudates.  NECK:  Supple with no JVD.  No lymphadenopathy.  LUNGS:  Clear clinically with good air entry bilaterally.  HEART:  S1, S2 regular.  No S3, S4, gallops or  rubs.  ABDOMEN:  Soft and nontender.  Bowel sounds are positive.  EXTREMITIES:  The patient has swelling with tenderness on induration of the  right upper arm.  He also had severe tenderness on the right arm.  No  pitting or pedal edema, tenderness or calf induration noted.   LABORATORY DATA AND X-RAY FINDINGS:  Carotid Doppler ultrasound of the right  upper extremity does reveal deep venous thrombosis in the basilic and  cephalic veins.   White blood cell count 12, hemoglobin 14.4, hematocrit 44.1, MCV 72.3,  platelet count 256 with no left shift.  PT 13.4, INR 1.0, PTT 28.  Sodium  139, potassium 3.7, chloride 106, CO2 27, glucose 119, BUN 16, creatinine  1.4, calcium 9.3.   ASSESSMENT:  [name] is a 50 year old, African-American male who has a  history of severe intractable migraines who received intravenous medications  at [facility] about 10 days ago.  The patient reported to [facility]  through the Urgent Care with history of swelling and tenderness in right  upper arm.  Doppler ultrasound confirms the presence of deep venous  thrombosis in the basilic and cephalic vein.   PLAN:  Plan is to order blood for hypercoagulable workup.  We will start him  on anticoagulation with Lovenox and bridging with Coumadin.  I think once  the patient's blood work has been done and he remains stable, we may be able  to discharge him on Lovenox and Coumadin at home until he is therapeutic.   I have discussed the above plan with him and he verbalized full  understanding.  I will resume all his other previous medications.      Audria Nine, M.D.  Electronically Signed     AM/MEDQ  D:  03/18/2006  T:  03/18/2006  Job:  [id number]   cc:   Vernon Prey. Tamala [name], M.D.  Fax: [phone]

## 2011-03-05 NOTE — Group Therapy Note (Signed)
[name], HEMP               ACCOUNT NO.:  [id number]   MEDICAL RECORD NO.:  [id number]          PATIENT TYPE:  INP   LOCATION:  [id number]                          FACILITY:  [facility]   PHYSICIAN:  Audria Nine, M.D.DATE OF BIRTH:  12-16-1960   DATE OF PROCEDURE:  03/20/2006  DATE OF DISCHARGE:                                   PROGRESS NOTE   SUBJECTIVE:  The patient continues to do well.  He says the pain in his  right arm is significantly improved and he did have much better last night.  His anxiety is also decreased.   OBJECTIVE:  GENERAL:  The patient is alert, comfortable, not in acute  distress.  VITAL SIGNS:  Blood pressure 109/73, pulse 62, respirations 20, temperature  98.6.  Oxygen saturation 97% on room air.  HEENT:  Normocephalic and atraumatic.  Oral mucosa moist.  No exudates.  NECK:  Supple.  No JVD.  LUNGS:  Clear clinically.  HEART:  S1 and S2 regular.  Negative for gallops or rubs.  ABDOMEN:  Soft, nontender.  EXTREMITIES:  Much less swelling in his right upper and with significantly  decreased tenderness and induration.   LABORATORY DATA:  White blood cell count 11, hemoglobin 14, hematocrit 43.7,  platelet count 216 with no left shift.   Sodium 135, potassium 3.6, chloride 104, CO2 24, glucose 120, BUN 19,  creatinine 1.7, calcium 9.3.   ASSESSMENT AND PLAN:  1.  Slightly worsening creatinine.  Will hydrate him and recheck levels in      the morning.  2.  Deep venous thrombosis of right upper arm.  The patient is on Lovenox      and Coumadin.  INR is still not moving.  I will give 7.5 mg Coumadin      today.  Pharmacy is also following.  3.  Anxiety which is much better.  Encouraged him to use analgesia as this      will significantly make him more comfortable.   DISPOSITION:  Will await therapeutic INR prior to discharge.      Audria Nine, M.D.  Electronically Signed     AM/MEDQ  D:  03/20/2006  T:  03/20/2006  Job:  [id number]

## 2011-03-05 NOTE — Discharge Summary (Signed)
[facility]  Patient:    [name], [name] Visit Number: [name] MRN: [id number]          Service Type: MED Location: 2 A216 01 Attending Physician:  Tula Nakayama Dictated by:   Tula Nakayama, M.D. Admit Date:  07/07/2001 Discharge Date: 07/14/2001                             Discharge Summary  DISCHARGE DIAGNOSES: 1. Severe headache. 2. Severe hypertension. 3. Depression and anxiety.  HISTORY OF PRESENT ILLNESS:  [name] is a 50 year old, African-American male who was admitted secondary to progressive weakness and lightheadedness with gait instability secondary to orthostatic hypotension.  The patient has a history of severe hypotension and aggressive outpatient attempts to control his blood pressure resulted with him presenting with systolic blood pressures of 90 with significant orthostatic falls.  The patient had been seen in the office four days prior to John admission and [name] had an additional 12.5 mg of hydrochlorothiazide added to his outpatient regimen and [name] got progressively weak and lightheaded.  Because of his symptoms and the fact that [name] was orthostatic, [name] was admitted for blood pressure control and further work-up of his hypertension.  The other very significant complaint of [name] is that [name] has had disabling headaches repeatedly in the past over the years.  However, in the past approximately four weeks, [name] has had several emergency room visits and has been evaluated by neurology as well with attempt to get control of disabling headaches.  At the time of his admission, his headaches are also very much out of control and rated as a 10/10.  [name] also complained of a three-day history of sore throat with swelling in the throat and discomfort on swallowing.  PAST MEDICAL HISTORY: 1. Severe hypertension. 2. Headaches. 3. Anxiety. 4. Depression. 5. Insomnia.  ALLERGIES:  No known drug  allergies.  MEDICATIONS: 1. Elavil 25 mg two at bedtime. 2. Alprazolam 0.5 mg three times daily. 3. Avalide 150/12.5 one tablet daily. 4. Propranolol 40 mg one twice daily. 5. (New) Hydrochlorothiazide 12.5 mg daily.  SOCIAL HISTORY:  [name] is a divorced father of two children.  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  [name] smokes one pack of cigarettes per day.  [name] denies alcohol use.  FAMILY HISTORY:  Positive for suicide in a sibling in her late 19s.  PHYSICAL EXAMINATION:  GENERAL:  [name] was alert, complaining of severe headache.  [name] was in no obvious cardiopulmonary distress.  VITAL SIGNS:  Temperature 98.7, pulse 82, respiratory rate 20, blood pressure 106/67.  HEENT:  Positive for anterior cervical adenitis, mild.  Extraocular movements intact.  No facial asymmetry.  No exudate was seen in his oropharynx.  CHEST:  Clear air entry bilaterally, no crackles or wheezes heard.  CARDIOVASCULAR:  Heart sounds S1, S2, no murmurs, no S3.  ABDOMEN:  Soft, nontender, positive bowel sounds, no organomegaly or masses.  EXTREMITIES:  Negative for edema, 2+ DP bilaterally.  LABORATORY DATA AND X-RAY FINDINGS:  On admission, hemoglobin 12.9, white cell count 14.2.  Chemistries with sodium 137, potassium 4.2, chloride 101, CO2 32, BUN 21, creatinine 1.4, glucose 92.  HOSPITAL COURSE:  #1 - HYPERTENSION:  The patient was admitted to the medical floor for concentrated care and kept on telemetry.  [name] had a neurology consult with Dr. Doralee Albino, his regular neurologist.  On admission, [name] received regular IV hydration with withholding of his  antihypertensive medications which were gradually reintroduced when his pressures normalized and then subsequently again became significantly elevated.  Cardiology was asked to assist in his blood pressure management since on hospital day #3, when all of his previous medications had been added.  His blood pressures were still very high with systolic  blood pressures as high as 200.  Secondary causes of hypertension were also evaluated.  [name] had a renal ultrasound which was totally negative and [name] also had a scan of his abdomen looking for any evidence of an anginal tumor and John was totally normal.  At the time of his discharge, his blood pressure was well-controlled.  [name] was able to ambulate without orthostatic changes and [name] denied any lightheadedness or dizziness at that time.  Of note, [name] for the most part had been primarily on bed rest through his hospital stay secondary to accommodation of his severe hypertension as well as his disabling headache for which [name] was on multiple pain medications.  #2 - CENTRAL NERVOUS SYSTEM:  The patients CNS exam was totally normal. However, [name] continued to complain of significant persistent headache despite the addition of more narcotic medication as well as a trial of dihydroergotamine and prednisone pack per neurology.  [name] had a repeat CAT scan, although [name] had in the recent past had both a negative CT scan and MRI of his head.  However, since [name] was so symptomatic, it was decided to repeat the scan prior to spinal tap which was done by Dr. Doralee Albino on September 24. The patient tolerated the procedure well and reported significant improvement in his symptoms after the tap.  [name] rated his headache at that time, before the tap, at 6/10.  After the tap, [name] stated that [name] had absolutely no headache. It was noted by neurology that the findings were suggestive of pseudotumor cerebri.  His open pressure was noted to be elevated at 20-21 cmH20 and 40 cc of fluid was removed.  #3 - GASTROINTESTINAL:  [name] had significant constipation while in hospital and actually required milk of molasses enema prior to his discharge; however, [name] did respond to John.  Several attempts had been made with oral laxatives to no avail.  #4 - INFECTIOUS DISEASE:  [name] received a three-day course of Augmentin for his  cervical adenitis and sore throat as well as lozenges and do swish and  swallow.  [name] had complete symptom resolution.  CONDITION ON DISCHARGE:  Stable and much improved. Dictated by:   Tula Nakayama, M.D. Attending Physician:  Tula Nakayama DD:  07/19/01 TD:  07/20/01 Job: [id number]

## 2011-03-05 NOTE — Discharge Summary (Signed)
[facility]  Patient:    [name], [name] Visit Number: [id number] MRN: [id number]          Service Type: MED Location: 2 A216 01 Attending Physician:  Tula Nakayama Dictated by:   Tula Nakayama, M.D. Admit Date:  07/07/2001 Discharge Date: 07/14/2001                             Discharge Summary  ADDENDUM   DISCHARGE MEDICATIONS:  1. Clonidine 0.3 mg twice daily.  2. Labetalol 100 mg twice daily.  3. Verapamil 80 mg three times daily.  4. Nexium 40 mg daily x 2 weeks.  5. Avalide 150/12.5 two every morning.  6. Zanaflex 8 mg three times daily.  7. Fentanyl patch 50 mcg per hour, change every 72 hours.  8. Prednisone tablets to be taken as directed.  9. Alprazolam 1 mg three times daily. 10. Celexa 20 mg daily. 11. Elavil 100 mg at bedtime.  FOLLOWUP:  The patient will follow up with Dr. Moshe Cipro, the patients primary doctor, on October 2.  Follow up with Dr. Doralee Albino, his neurologist, on July 21, 2001.  He has an appointment with psychiatry on October 21. Dictated by:   Tula Nakayama, M.D. Attending Physician:  Tula Nakayama DD:  07/19/01 TD:  07/20/01 Job: [id number]

## 2011-03-05 NOTE — Procedures (Signed)
[name], [name]               ACCOUNT NO.:  [id number]   MEDICAL RECORD NO.:  [id number]          PATIENT TYPE:  OUT   LOCATION:  SLEEP LAB                     FACILITY:  [facility]   PHYSICIAN:  Kathee Delton, MD,FCCPDATE OF BIRTH:  22-Feb[birth year]1960   DATE OF STUDY:  12/17/2006                            NOCTURNAL POLYSOMNOGRAM   REFERRING PHYSICIAN:  Weston Settle, M.D.   DATE OF STUDY:  December 17, 2006.   INDICATION FOR STUDY:  Hypersomnia with sleep apnea.   EPWORTH SLEEPINESS SCORE:  Two.   SLEEP ARCHITECTURE:  The patient had a total sleep time of 378 minutes  with adequate slow wave sleep but very decreased REM.  Sleep onset  latency was normal and REM onset was very prolonged at 153 minutes.  Sleep efficiency was 94%.   RESPIRATORY DATA:  The patient was found to have 47 hypopneas, 10  obstructive apneas and 1 central apnea for an apnea/hypopnea index of 9  events per hour.  The events were not positional; however, there was  loud snoring noted throughout.  The patient did not meet split night  protocol secondary to most of the events occurring after 1 a.m.   OXYGEN DATA:  The patient had O2 desaturation as low as 82% with the  obstructive events.  It should be noted the patient normally wears  oxygen at home at 2 liters; however, this was not worn for this  particular study.  The patient did spend approximately 59 minutes  between 81% and 90% saturation.   CARDIAC DATA:  No clinically significant cardiac arrhythmias were noted.   MOVEMENT-PARASOMNIA:  The patient was found to have 24 leg jerks with 1  per hour resulting in arousal or awakening.  This is not clinically  significant.   IMPRESSION-RECOMMENDATIONS:  Mild obstructive sleep apnea/hypopnea  syndrome with an apnea/hypopnea index of 9 events per hour and O2  desaturation as low as 82%.  Treatment for this degree of sleep apnea  may include weight loss alone if applicable, upper airway surgery, oral  appliance, and also CPAP.  Clinical  correlation is suggested.  The patient did not undergo split night  protocol secondary to the small numbers of events noted in the first  half of the night.      Kathee Delton, MD,FCCP  Diplomate, [place] Board of Sleep  Medicine  Electronically Signed     [facility]/MEDQ  D:  12/29/2006 16:51:03  T:  12/31/2006 08:18:58  Job:  [id number]

## 2011-03-05 NOTE — Discharge Summary (Signed)
[name], [name]               ACCOUNT NO.:  [id number]   MEDICAL RECORD NO.:  [id number]          PATIENT TYPE:  INP   LOCATION:  [id number]                          FACILITY:  [facility]   PHYSICIAN:  Audria Nine, M.D.DATE OF BIRTH:  01/05/1961   DATE OF ADMISSION:  03/18/2006  DATE OF DISCHARGE:  06/04/2007LH                                 DISCHARGE SUMMARY   DISCHARGE DIAGNOSES:  1.  Deep vein thrombosis of right upper arm.  2.  History of migraine headaches.  3.  Hypertension.  4.  Chronic anxiety disorder.   DISCHARGE MEDICATIONS:  1.  Cardizem CD 240 mg p.o. once a day.  2.  Lovenox 1 mg/kg once a day.  3.  Coumadin 5 mg p.o. once a day.  4.  Lunesta 3 mg p.o. nightly.  5.  Alprazolam 0.5 mg p.o. at bedtime.  6.  Vicodin one to two tablets every 6 hours as needed.  7.  Coumadin 7.5 mg p.o. for five days prior to starting the 5 mg above.   FOLLOW UP:  1.  The patient is to follow up at the Coumadin clinic in about a week.  2.  Follow up with Dr. Tamala [name] in about one week.  The patient was scheduled      for the Coumadin clinic for March 23, 2006 at 9:30 a.m.   SPECIAL INSTRUCTIONS:  The patient is to  have blood drawn for PT/INR on  Thursday March 24, 2006 and if INR is between 2 and 3, she is to stop the  Lovenox.   DISCHARGE DIET:  Regular diet.   DISCHARGE ACTIVITIES:  As tolerated.   HOSPITAL COURSE:  [name] is a 50 year old African-American male who is a  direct admission from [facility] [place].  The patient complained of  swelling and severe pain in his right upper arm over 3-4 days prior to  admission.  He gives a history of severe intractable headaches for which he  went to [facility] for help.  The patient was seen in the emergency  room and an IV was started on him.  The patient states after the IV was  taken, he began to have swelling in his arm.  This prompted his visit to  Urgent Care after he was discharged from [facility].  Urgent Care  subsequently did  an ultrasound which confirmed that patient had a deep vein thrombosis.  He  was subsequently admitted for anticoagulation.  He was started on Lovenox  and Coumadin. Over time the patient's arm improved. Blood work was sent for  hypercoagulable studies, the results of which were mostly pending at time of  his discharge.  His pro time was 10.7.   REPORT/LABORATORY DATA:  Ultrasound shows deep vein thrombosis in the  cephalic and basilic veins on the right.  A 12-lead EKG shows normal sinus  rhythm with no acute ST-T changes.   Overall the patient did fairly well.  He had some pain issues but this was  adequately controlled with Dilaudid.  Patient did fairly well and was  discharged in satisfactory  condition.  DISPOSITION:  The patient was given instructions on Coumadin and the  benefits and risks were explained to patient including an information  booklet. He is  to follow closely with Coumadin clinic.  I anticipate he  probably will be anticoagulated for about three months or so.      Audria Nine, M.D.  Electronically Signed     AM/MEDQ  D:  04/06/2006  T:  04/06/2006  Job:  [id number]

## 2011-03-05 NOTE — Group Therapy Note (Signed)
NAMEJERMANIE, [name]               ACCOUNT NO.:  [id number]   MEDICAL RECORD NO.:  [id number]          PATIENT TYPE:  INP   LOCATION:  A210                          FACILITY:  [facility]   PHYSICIAN:  Audria Nine, M.D.DATE OF BIRTH:  May 31, 1961   DATE OF PROCEDURE:  03/19/2006  DATE OF DISCHARGE:                                   PROGRESS NOTE   SUBJECTIVE:  The patient is doing fairly well, has very minimal pain in the  right arm.  Overall, he thinks a little bit better from yesterday.  The  patient does have significant anxiety issues and said he could not sleep  well last night.   OBJECTIVE:  GENERAL:  Conscious, alert, comfortable, not in acute distress.  VITAL SIGNS:  Blood pressure 114/79, pulse of 62, respirations 20,  temperature 98.7, oxygen saturation is 96% on room air.  HEENT:  Normocephalic atraumatic.  Oral mucosa moist.  No exudate.  NECK:  Supple.  No JVD.  LUNGS:  Clear clinically.  Good air entry bilaterally.  HEART:  S1 S2 regular.  No S3, S4, gallop, or rubs.  ABDOMEN:  Soft, nontender.  Bowel sounds positive.  EXTREMITIES:  The patient has swelling with tenderness and induration of the  right upper arm.  The swelling and tenderness appears to be about the same  as yesterday.   LABORATORY/DIAGNOSTIC DATA:  White blood cell count was 11.9, hemoglobin  13.9, hematocrit 43, platelet count was 234 with no left shift.  PT 13.9,  INR 1.1.  Sodium 135, potassium of 4, chloride of 101, CO2 is 26, glucose  105, BUN of 16, creatinine is 1.6, calcium is 9.5.   ASSESSMENT/PLAN:  1.  Deep vein thrombosis of the right upper arm.  We will continue on      Lovenox, Coumadin at this time.  The patient said he is unable to inject      himself, neither does the wife think she will be able to do it.  We will      continue until the patient's INR is close to being therapeutic.  We will      await further hypercoagulable workup report.  2.  Chronic anxiety.  The patient will  continue on his anxiolytics and pain      control.   DISPOSITION:  Once the patient's INR is therapeutic, I think he will be  ready to go home.      Audria Nine, M.D.  Electronically Signed     AM/MEDQ  D:  03/19/2006  T:  03/19/2006  Job:  [id number]

## 2011-03-05 NOTE — H&P (Signed)
[name], [name]               ACCOUNT NO.:  [id number]   MEDICAL RECORD NO.:  [id number]          PATIENT TYPE:  INP   LOCATION:  [id number]                          FACILITY:  [facility]   PHYSICIAN:  Leane Para C. Tamala [name], M.D.   DATE OF BIRTH:  [birth year]   DATE OF ADMISSION:  01/05/2005  DATE OF DISCHARGE:  LH                                HISTORY & PHYSICAL   A 50 year old male admitted for evaluation of recurrent headache and  dizziness.  The patient has a long history of headaches.  He was seen in the  office five days prior to admission complaining of increasing headache.  It  was noted that his blood pressure was mildly elevated with a systolic about  [id number] to 0000000 range and with diastolics of about [id number].  The patient had been on  Cardizem LA 240, and his Cardizem LA was increased to Cardizem LA 360.  After this, he states that his symptoms became much worse the next day and  that he was more exhausted; however, it should be noted that in the office,  the patient also complained of dizziness.  He had a significant falling  sensation and stumbling whenever he would rise from a sitting to a standing  position.  He had dizziness when he made quick turns.  He has some  nasopharyngeal congestion and has some serous otitis.  He was started on  Nalex twice daily and was advised to use saline nose drops and was given a  sample of Astelin nasal spray.  I am unsure as to whether or not he used the  Astelin or if he ever got the saline nose drops.  Nevertheless, he states  that his symptoms became much worse, and he contacted the office on the  evening prior to admission, stating that he felt as if his headaches were  worse and that the Cardizem LA 360 was making him dizzy.  It was felt that  this was not related to the medication since he had all of these symptoms  prior to being seen in the office and prior to having his medication  increased.  The patient came to the emergency room because of  worsening  headache and exhaustion.  He also states that on the day prior to coming to  the emergency room, he became very dizzy and fell in his bathroom, striking  his head.  He did not have loss of consciousness.  The patient complains of  extreme fatigue, and he also relates that his headaches are particularly  aggravating when he is at work at [employer].  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  He seems to have severe headaches and  dizziness the whole time he is at work, and he states that when he arrives  at home, he is extremely exhausted.  The patient does not have any  neurologic difficulty.  On evaluating his blood pressures that he had in the  emergency room, he actually did  not have significant orthostatic changes in  that his blood pressure did not drop below 130 on initial exam, and his  pulse did not increase but actually decreased.  This rules against  orthostatic dizziness, but he does have probable positional dizziness.  It  is felt that his symptoms are severe enough that he needs to be admitted,  and we will adjust his medication while he is in the hospital.   PAST MEDICAL HISTORY:  He had a similar episode in September, 2002 when he  had severe hypertension and severe headache.  At that time, he had pure  orthostatic dizziness with systolic blood pressures dropping down in the  90s.  He had full neurologic evaluation for his headache by Dr. Merlene Laughter at  that time.  Another interesting factor is that his headaches did not improve  during the hospitalization until he had a lumbar puncture.  During the  lumbar puncture, it was noted that his opening pressure was only 20 ml of  water, and 40 ml of fluid was removed.  He had instantaneous and total  resolution of headache post lumbar puncture.  This suggests that he may  indeed have pseudotumor cerebri.  This will have to be  addressed, but if we  can control his symptoms acutely, we can have him follow up with Dr.  Merlene Laughter as an outpatient.  Other medical problems include chronic anxiety  and depression.   MEDICATIONS:  1.  Demerol 50 mg b.i.d. p.r.n. headache.  2.  Celebrex 200 mg daily.  3.  Cardizem LA 360 mg daily.  4.  Nalex-A 1 b.i.d.  5.  He uses Lunesta 3 mg q.h.s. for sleep.   ALLERGIES:  None.   SOCIAL HISTORY:  He is married.  He is employed at [employer] in  [place].  He smokes a pack of cigarettes per day.  He denies drug and  alcohol use.   FAMILY HISTORY:  Basically unremarkable.  There is no history of severe  headaches.  No history of hypertension, that he knows of.   PHYSICAL EXAMINATION:  VITAL SIGNS:  Blood pressure 120/78 standing, 130/80  lying.  Pulse is 58 standing and 58 lying.  O2 saturation is 100% on room  air.  GENERAL:  The patient is lying quietly in bed, although he has been  medicated in the emergency room.  HEENT:  Pupils are equal, round and reactive.  Extraocular movements are  intact.  Examination of the skull reveals no palpable tenderness.  There is  no temporal artery or anterior temporal parietal tenderness.  There is no  mass or swelling.  Tympanic membranes show bilateral mild serous otitis  without erythema.  Intact tympanic membranes.  NECK:  Supple.  There is no JVD, bruits, adenopathy, or thyromegaly.  CHEST:  Clear to auscultation.  No rales, rhonchi or wheezes.  HEART:  Regular rate and rhythm without murmur, rub or gallop.  ABDOMEN:  Mildly distended but soft with normoactive bowel sounds.  No  tenderness.  No masses.  EXTREMITIES:  No clubbing, cyanosis or edema.  NEUROLOGIC:  No focal motor, sensory, or cerebellar deficits.  Cranial  nerves are intact.  Gait and stance are intact at this time.  There is no  deviation to the left or right.  Sitting and standing balance is good.   IMPRESSION: 1.  Recurrent dizziness, which is  actually chronic but suggests positional      dizziness rather than orthostatic dizziness.  2.  Hypertension,  well controlled without elevation on present medication.  3.  Severe headaches, either of migraine or migraine equivalent, or possibly      pseudotumor cerebri.  4.  Osteoarthritis.  5.  Chronic anxiety disorder.   PLAN:  Patient will be admitted.  Since his blood pressure is fairly well  controlled, I will not reinstitute Cardizem LA at this time, simply because  we do not have the LA on formulary, but I still think this will be a good  drug choice for him.  I will use Avapro for the time being and monitor his  blood pressure closely.  I do not think that Avapro is going to be a  problem.  He is started on hydrochlorothiazide, but this may have been too  active in him previously, so we will have to monitor how he reacts to it and  make sure that he does not develop profound orthostatic changes.  A sinus  series will be ordered, and if there is any suggestion of abnormality, we  will get CT of his sinuses; however, review of the CT of his head does not  suggest there is any significant sinus disease.  Other treatment will be  done based on his response to the above therapy.    LCS/MEDQ  D:  01/05/2005  T:  01/05/2005  Job:  [id number]

## 2011-04-23 ENCOUNTER — Emergency Department (HOSPITAL_COMMUNITY): Payer: BC Managed Care – PPO

## 2011-04-23 ENCOUNTER — Other Ambulatory Visit: Payer: Self-pay

## 2011-04-23 ENCOUNTER — Emergency Department (HOSPITAL_COMMUNITY)
Admission: EM | Admit: 2011-04-23 | Discharge: 2011-04-23 | Disposition: A | Discharge status: Home / Self Care | Payer: BC Managed Care – PPO | Attending: Emergency Medicine | Admitting: Emergency Medicine

## 2011-04-23 DIAGNOSIS — Z79899 Other long term (current) drug therapy: Secondary | ICD-10-CM | POA: Insufficient documentation

## 2011-04-23 DIAGNOSIS — I10 Essential (primary) hypertension: Secondary | ICD-10-CM | POA: Insufficient documentation

## 2011-04-23 DIAGNOSIS — R079 Chest pain, unspecified: Secondary | ICD-10-CM | POA: Insufficient documentation

## 2011-04-23 LAB — BASIC METABOLIC PANEL
CO2: 28 mEq/L (ref 19–32)
Chloride: 102 mEq/L (ref 96–112)
GFR calc Af Amer: >60 mL/min (ref >60)
GFR calc non Af Amer: 55 mL/min — ABNORMAL LOW (ref >60)
Glucose, Bld: 83 mg/dL (ref 70–99)
Potassium: 3.6 mEq/L (ref 3.5–5.1)
Sodium: 140 mEq/L (ref 135–145)

## 2011-04-23 LAB — DIFFERENTIAL
Basophils Relative: 0 % (ref 0–1)
Lymphocytes Relative: 33 % (ref 12–46)
Lymphs Abs: 3.2 10*3/uL (ref 0.7–4.0)
Monocytes Absolute: 0.8 10*3/uL (ref 0.1–1.0)
Monocytes Relative: 8 % (ref 3–12)
Neutro Abs: 5.4 10*3/uL (ref 1.7–7.7)
Neutrophils Relative %: 57 % (ref 43–77)

## 2011-04-23 LAB — CBC
HCT: 41.7 % (ref 39.0–52.0)
Hemoglobin: 14 g/dL (ref 13.0–17.0)
MCH: 23.2 pg — ABNORMAL LOW (ref 26.0–34.0)
MCHC: 33.6 g/dL (ref 30.0–36.0)
MCV: 69 fL — ABNORMAL LOW (ref 78.0–100.0)
RBC: 6.04 MIL/uL — ABNORMAL HIGH (ref 4.22–5.81)

## 2011-04-23 LAB — CK TOTAL AND CKMB (NOT AT ARMC): Relative Index: 1.7 (ref 0.0–2.5)

## 2011-04-23 MED ORDER — IOHEXOL 350 MG/ML SOLN
100.0000 mL | Freq: Once | INTRAVENOUS | Status: AC | PRN
  Administered 2011-04-23: 100 mL via INTRAVENOUS

## 2011-07-15 LAB — BASIC METABOLIC PANEL
BUN: 15
CO2: 31
Calcium: 9.3
Glucose, Bld: 106 — ABNORMAL HIGH
Sodium: 142

## 2011-07-15 LAB — PROTIME-INR: Prothrombin Time: 14.3

## 2011-07-15 LAB — POCT I-STAT, CHEM 8
BUN: 15
Creatinine, Ser: 1.6 — ABNORMAL HIGH
Glucose, Bld: 80
Hemoglobin: 16.3
Sodium: 140
TCO2: 25

## 2011-07-15 LAB — CBC
HCT: 41
HCT: 40.3
Hemoglobin: 13.4
MCHC: 32.8
MCV: 71.7 — ABNORMAL LOW
Platelets: 206
Platelets: 198
RDW: 15.4
RDW: 15.4

## 2011-07-15 LAB — DIFFERENTIAL
Basophils Absolute: 0
Basophils Absolute: 0
Basophils Relative: 0
Basophils Relative: 0
Eosinophils Absolute: 0.1
Eosinophils Relative: 1
Eosinophils Relative: 1
Monocytes Absolute: 0.7
Neutro Abs: 5.7

## 2012-09-20 ENCOUNTER — Emergency Department (HOSPITAL_COMMUNITY): Payer: BC Managed Care – PPO

## 2012-09-20 ENCOUNTER — Encounter (HOSPITAL_COMMUNITY): Payer: Self-pay

## 2012-09-20 ENCOUNTER — Emergency Department (HOSPITAL_COMMUNITY)
Admission: EM | Admit: 2012-09-20 | Discharge: 2012-09-21 | Disposition: A | Discharge status: Home / Self Care | Payer: BC Managed Care – PPO | Attending: Emergency Medicine | Admitting: Emergency Medicine

## 2012-09-20 DIAGNOSIS — R109 Unspecified abdominal pain: Secondary | ICD-10-CM | POA: Insufficient documentation

## 2012-09-20 DIAGNOSIS — F17200 Nicotine dependence, unspecified, uncomplicated: Secondary | ICD-10-CM | POA: Insufficient documentation

## 2012-09-20 DIAGNOSIS — R1030 Lower abdominal pain, unspecified: Secondary | ICD-10-CM

## 2012-09-20 DIAGNOSIS — I10 Essential (primary) hypertension: Secondary | ICD-10-CM | POA: Insufficient documentation

## 2012-09-20 DIAGNOSIS — G43909 Migraine, unspecified, not intractable, without status migrainosus: Secondary | ICD-10-CM | POA: Insufficient documentation

## 2012-09-20 DIAGNOSIS — M549 Dorsalgia, unspecified: Secondary | ICD-10-CM | POA: Insufficient documentation

## 2012-09-20 HISTORY — DX: Migraine, unspecified, not intractable, without status migrainosus: G43.909

## 2012-09-20 HISTORY — DX: Essential (primary) hypertension: I10

## 2012-09-20 LAB — CBC WITH DIFFERENTIAL/PLATELET
Basophils Absolute: 0 10*3/uL (ref 0.0–0.1)
Lymphs Abs: 2.6 10*3/uL (ref 0.7–4.0)
MCV: 68.3 fL — ABNORMAL LOW (ref 78.0–100.0)
Monocytes Absolute: 0.4 10*3/uL (ref 0.1–1.0)
Monocytes Relative: 6 % (ref 3–12)
Platelets: 197 10*3/uL (ref 150–400)
RDW: 15 % (ref 11.5–15.5)
WBC: 7.3 10*3/uL (ref 4.0–10.5)

## 2012-09-20 LAB — BASIC METABOLIC PANEL
CO2: 26 mEq/L (ref 19–32)
Calcium: 9.4 mg/dL (ref 8.4–10.5)
Creatinine, Ser: 1.32 mg/dL (ref 0.50–1.35)
GFR calc non Af Amer: 61 mL/min — ABNORMAL LOW (ref >90)

## 2012-09-20 LAB — URINALYSIS, ROUTINE W REFLEX MICROSCOPIC
Ketones, ur: NEGATIVE mg/dL
Leukocytes, UA: NEGATIVE
Nitrite: NEGATIVE
Protein, ur: NEGATIVE mg/dL
Urobilinogen, UA: 0.2 mg/dL (ref 0.0–1.0)

## 2012-09-20 MED ORDER — OXYCODONE-ACETAMINOPHEN 5-325 MG PO TABS: 1.0000 | ORAL_TABLET | ORAL | Status: DC | PRN

## 2012-09-20 MED ORDER — IOHEXOL 300 MG/ML  SOLN
120.0000 mL | Freq: Once | INTRAMUSCULAR | Status: AC | PRN
  Administered 2012-09-20: 120 mL via INTRAVENOUS

## 2012-09-20 MED ORDER — MORPHINE SULFATE 4 MG/ML IJ SOLN
4.0000 mg | Freq: Once | INTRAMUSCULAR | Status: AC
  Administered 2012-09-20: 4 mg via INTRAVENOUS
  Filled 2012-09-20: qty 1

## 2012-09-20 NOTE — ED Notes (Signed)
Pt c/o lower abd pain for 1 week also with lower back pain. Denies nausea vomiting or diarrhea.

## 2012-09-20 NOTE — ED Provider Notes (Signed)
History    This chart was scribed for Sharyon Cable, MD, MD by Rhae Lerner, ED Scribe. The patient was seen in room APA01 and the patient's care was started at 9:03PM.   CSN: [id number]  Arrival date & time 09/20/12  1845       Chief Complaint  Patient presents with  . Abdominal Pain     Patient is a 51 y.o. male presenting with abdominal pain. The history is provided by the patient. No language interpreter was used.  Abdominal Pain The primary symptoms of the illness include abdominal pain. The primary symptoms of the illness do not include fever, nausea, vomiting, diarrhea or dysuria. The current episode started more than 2 days ago. The onset of the illness was gradual. The problem has been gradually worsening.  The abdominal pain is located in the suprapubic region. The abdominal pain does not radiate.  Additional symptoms associated with the illness include back pain. Symptoms associated with the illness do not include chills or frequency.   [name] is a 51 y.o. male who presents to the Emergency Department complaining of constant, moderate suprapubic abdominal pain and lower back pain onset 1 day ago. Pt reports gradual onset and symptoms are gradually worsening. Pt denies fevers, vomiting, nausea, diarrhea, changes in bowel habits, blood in stools, dysuria, urinary frequency, hx of similar pain and any other pain. He reports hx of hernia.   Past Medical History  Diagnosis Date  . Hypertension   . Migraine     Past Surgical History  Procedure Date  . Hernia repair   . Knee surgery     History reviewed. No pertinent family history.  History  Substance Use Topics  . Smoking status: Current Every Day Smoker  . Smokeless tobacco: Not on file  . Alcohol Use: No      Review of Systems  Constitutional: Negative for fever and chills.  Gastrointestinal: Positive for abdominal pain. Negative for nausea, vomiting, diarrhea and blood in stool.  Genitourinary:  Negative for dysuria and frequency.  Musculoskeletal: Positive for back pain.  Neurological: Negative for weakness.  All other systems reviewed and are negative.    Allergies  Review of patient's allergies indicates no known allergies.  Home Medications  No current outpatient prescriptions on file.  BP 161/117  Pulse 85  Temp 98.3 F (36.8 C) (Oral)  Resp 20  Ht 6' (1.829 m)  Wt 230 lb (104.327 kg)  BMI 31.19 kg/m2  SpO2 98%  Physical Exam  Nursing note and vitals reviewed. CONSTITUTIONAL: Well developed/well nourished HEAD AND FACE: Normocephalic/atraumatic EYES: EOMI/PERRL ENMT: Mucous membranes moist NECK: supple no meningeal signs SPINE:entire spine nontender CV: S1/S2 noted, no murmurs/rubs/gallops noted LUNGS: Lungs are clear to auscultation bilaterally, no apparent distress ABDOMEN: soft, diffuse moderate tenderness to lower abdomen , no rebound or guarding GU:no cva tenderness, no testicular tenderness or hernia RECTAL: no prostate tenderness, no mass  NEURO: Pt is awake/alert, moves all extremitiesx4, no focal neuro deficits EXTREMITIES: full ROM SKIN: warm, color normal PSYCH: no abnormalities of mood noted   ED Course  Procedures  DIAGNOSTIC STUDIES: Oxygen Saturation is 98% on room air, normal by my interpretation.    COORDINATION OF CARE: 9:10 PM Discussed ED treatment with pt  9:13 PM Ordered:    .  morphine injection  4 mg Intravenous Once        Labs Reviewed  URINALYSIS, ROUTINE W REFLEX MICROSCOPIC  BASIC METABOLIC PANEL  CBC WITH DIFFERENTIAL  Pt with lower abdominal pain for several days and he appeared uncomfortable.  CT imaging/labs performed and no acute process.  I discussed strict return precautions and discussed need for close PCP followup.     MDM  Nursing notes including past medical history and social history reviewed and considered in documentation Labs/vital reviewed and considered      I personally performed  the services described in this documentation, which was scribed in my presence. The recorded information has been reviewed and is accurate.    Sharyon Cable, MD 09/21/12 [phone]

## 2012-09-20 NOTE — ED Notes (Addendum)
Pt with lower back and lower abd pain x 1 week, states "stomach was upset" on Sunday, denies n/v/d, denies any burning on urination

## 2012-09-21 NOTE — ED Notes (Signed)
Pt discharged. Pt stable at time of discharge. Medications reviewed pt has no questions regarding discharge at this time. Pt voiced understanding of discharge instructions.  

## 2012-11-15 ENCOUNTER — Encounter (HOSPITAL_COMMUNITY): Payer: Self-pay | Admitting: Emergency Medicine

## 2012-11-15 ENCOUNTER — Emergency Department (HOSPITAL_COMMUNITY): Payer: BC Managed Care – PPO

## 2012-11-15 ENCOUNTER — Emergency Department (HOSPITAL_COMMUNITY)
Admission: EM | Admit: 2012-11-15 | Discharge: 2012-11-15 | Disposition: A | Discharge status: Home / Self Care | Payer: BC Managed Care – PPO | Attending: Emergency Medicine | Admitting: Emergency Medicine

## 2012-11-15 DIAGNOSIS — Z79899 Other long term (current) drug therapy: Secondary | ICD-10-CM | POA: Insufficient documentation

## 2012-11-15 DIAGNOSIS — I10 Essential (primary) hypertension: Secondary | ICD-10-CM | POA: Insufficient documentation

## 2012-11-15 DIAGNOSIS — G43909 Migraine, unspecified, not intractable, without status migrainosus: Secondary | ICD-10-CM | POA: Insufficient documentation

## 2012-11-15 DIAGNOSIS — F17200 Nicotine dependence, unspecified, uncomplicated: Secondary | ICD-10-CM | POA: Insufficient documentation

## 2012-11-15 DIAGNOSIS — R51 Headache: Secondary | ICD-10-CM | POA: Insufficient documentation

## 2012-11-15 DIAGNOSIS — Z7982 Long term (current) use of aspirin: Secondary | ICD-10-CM | POA: Insufficient documentation

## 2012-11-15 MED ORDER — LABETALOL HCL 5 MG/ML IV SOLN
40.0000 mg | Freq: Once | INTRAVENOUS | Status: AC
  Administered 2012-11-15: 40 mg via INTRAVENOUS
  Filled 2012-11-15: qty 8

## 2012-11-15 MED ORDER — HYDROCODONE-ACETAMINOPHEN 5-325 MG PO TABS: 1.0000 | ORAL_TABLET | Freq: Four times a day (QID) | ORAL | Status: DC | PRN

## 2012-11-15 MED ORDER — HYDROMORPHONE HCL PF 1 MG/ML IJ SOLN
1.0000 mg | Freq: Once | INTRAMUSCULAR | Status: AC
  Administered 2012-11-15: 1 mg via INTRAVENOUS
  Filled 2012-11-15: qty 1

## 2012-11-15 MED ORDER — LABETALOL HCL 5 MG/ML IV SOLN
20.0000 mg | Freq: Once | INTRAVENOUS | Status: AC
  Administered 2012-11-15: 20 mg via INTRAVENOUS
  Filled 2012-11-15: qty 4

## 2012-11-15 NOTE — ED Notes (Signed)
Pt seen migraine doctor today and told him to come to ED due to blood pressure. Pt c/o "slight" h/a at this time. Denies dizziness/sob. Pt c/o central chest tightness rating 4. Nondiaphoretic.

## 2012-11-15 NOTE — ED Notes (Signed)
MD at bedside. 

## 2012-11-15 NOTE — ED Notes (Signed)
Patient states he has been admitted in the past for similar problem with his blood pressure

## 2012-11-15 NOTE — ED Provider Notes (Signed)
History   This chart was scribed for [name], MD by [name], ED Scribe. The patient was seen in room APA09/APA09 and the patient's care was started at 6:40PM.    CSN: [id number]  Arrival date & time 11/15/12  1756   First MD Initiated Contact with Patient 11/15/12 1840      Chief Complaint  Patient presents with  . Hypertension    (Consider location/radiation/quality/duration/timing/severity/associated sxs/prior treatment) Patient is a 52 y.o. male presenting with hypertension and headaches. The history is provided by the patient. No language interpreter was used.  Hypertension This is a recurrent problem. The current episode started 3 to 5 hours ago. The problem occurs every several days. The problem has been gradually worsening. Associated symptoms include headaches. Pertinent negatives include no shortness of breath. Nothing aggravates the symptoms. The symptoms are relieved by medications. Treatments tried: Cardiazem (120 mg X 2 per day) The treatment provided mild relief.  Headache  This is a new problem. The current episode started 3 to 5 hours ago. The problem occurs constantly. The problem has been gradually worsening. The headache is associated with an unknown factor. Pain location: diffuse. The quality of the pain is described as dull. The pain is mild. The pain does not radiate. Pertinent negatives include no shortness of breath. He has tried nothing for the symptoms. The treatment provided no relief.    The pt is a current everyday smoker, however, he does not drink alcohol.  PCP Dr. [name].   Past Medical History  Diagnosis Date  . Hypertension   . Migraine     Past Surgical History  Procedure Date  . Hernia repair   . Knee surgery     History reviewed. No pertinent family history.  History  Substance Use Topics  . Smoking status: Current Every Day Smoker  . Smokeless tobacco: Not on file  . Alcohol Use: No      Review of Systems   Constitutional: Negative for diaphoresis and fatigue.  HENT: Negative for congestion, sinus pressure and ear discharge.   Eyes: Negative for discharge.  Respiratory: Negative for cough and shortness of breath.   Gastrointestinal: Negative for diarrhea.  Genitourinary: Negative for frequency and hematuria.  Musculoskeletal: Negative for back pain.  Skin: Negative for rash.  Neurological: Positive for headaches. Negative for dizziness and seizures.  Hematological: Negative.   Psychiatric/Behavioral: Negative for hallucinations.    Allergies  Review of patient's allergies indicates no known allergies.  Home Medications   Current Outpatient Rx  Name  Route  Sig  Dispense  Refill  . ACETAMINOPHEN 500 MG PO TABS   Oral   Take 500 mg by mouth every 6 (six) hours as needed. For pain         . ASPIRIN 325 MG PO TABS   Oral   Take 325 mg by mouth daily as needed. For pain         . IBUPROFEN 200 MG PO TABS   Oral   Take 800 mg by mouth every 6 (six) hours as needed. For pain         . OXYCODONE-ACETAMINOPHEN 5-325 MG PO TABS   Oral   Take 1 tablet by mouth every 4 (four) hours as needed for pain.   5 tablet   0   . ZOLMITRIPTAN 5 MG NA SOLN   Nasal   Place 1 spray into the nose as needed. For migraine  BP 186/111  Pulse 91  Temp 98 F (36.7 C) (Oral)  Resp 20  Ht 6' (1.829 m)  Wt 225 lb (102.059 kg)  BMI 30.52 kg/m2  SpO2 98%  Physical Exam  Constitutional: He is oriented to person, place, and time. He appears well-developed.  HENT:  Head: Normocephalic and atraumatic.  Eyes: Conjunctivae normal and EOM are normal. No scleral icterus.  Neck: Neck supple. No thyromegaly present.  Cardiovascular: Normal rate and regular rhythm.  Exam reveals no gallop and no friction rub.   No murmur heard. Pulmonary/Chest: No stridor. He has no wheezes. He has no rales. He exhibits no tenderness.  Abdominal: He exhibits no distension. There is no tenderness.  There is no rebound.  Musculoskeletal: Normal range of motion. He exhibits no edema.  Lymphadenopathy:    He has no cervical adenopathy.  Neurological: He is oriented to person, place, and time. Coordination normal.  Skin: No rash noted. No erythema.  Psychiatric: He has a normal mood and affect. His behavior is normal.    ED Course  Procedures (including critical care time)  DIAGNOSTIC STUDIES: Oxygen Saturation is 98% on room air, normal by my interpretation.    COORDINATION OF CARE:  6:46 PM- Treatment plan discussed with patient. Pt agrees with treatment.  9:55 PM- Recheck. Pt is feeling slightly better at this time.Treatment plan concerning management of hypertension and CT scan of head discussed with patient. Pt agrees with treatment.  10:53 PM- Recheck. Treatment plan concerning follow up with Dr. [name] discussed with patient. Pt agrees with treatment.          Labs Reviewed - No data to display  Ct Head Wo Contrast  11/15/2012  *RADIOLOGY REPORT*  Clinical Data: 52 year old male with severe headache.  CT HEAD WITHOUT CONTRAST  Technique:  Contiguous axial images were obtained from the base of the skull through the vertex without contrast.  Comparison: 01/10/2009  Findings: No acute intracranial abnormalities are identified, including mass lesion or mass effect, hydrocephalus, extra-axial fluid collection, midline shift, hemorrhage, or acute infarction.  The visualized bony calvarium is unremarkable.  IMPRESSION: No evidence of acute intracranial abnormality.   Original Report Authenticated By: Margarette Canada, M.D.       No diagnosis found.  Pt improved with medicine  MDM        The chart was scribed for me under my direct supervision.  I personally performed the history, physical, and medical decision making and all procedures in the evaluation of this patient.[name], MD 11/15/12 2256

## 2012-11-15 NOTE — ED Notes (Signed)
Discharge instructions reviewed with pt, questions answered. Pt verbalized understanding.  

## 2012-11-24 ENCOUNTER — Emergency Department (HOSPITAL_COMMUNITY)
Admission: EM | Admit: 2012-11-24 | Discharge: 2012-11-24 | Disposition: A | Discharge status: Home / Self Care | Payer: BC Managed Care – PPO | Attending: Emergency Medicine | Admitting: Emergency Medicine

## 2012-11-24 ENCOUNTER — Encounter (HOSPITAL_COMMUNITY): Payer: Self-pay | Admitting: Emergency Medicine

## 2012-11-24 DIAGNOSIS — Z79899 Other long term (current) drug therapy: Secondary | ICD-10-CM | POA: Insufficient documentation

## 2012-11-24 DIAGNOSIS — I809 Phlebitis and thrombophlebitis of unspecified site: Secondary | ICD-10-CM

## 2012-11-24 DIAGNOSIS — G43909 Migraine, unspecified, not intractable, without status migrainosus: Secondary | ICD-10-CM | POA: Insufficient documentation

## 2012-11-24 DIAGNOSIS — F17200 Nicotine dependence, unspecified, uncomplicated: Secondary | ICD-10-CM | POA: Insufficient documentation

## 2012-11-24 DIAGNOSIS — I10 Essential (primary) hypertension: Secondary | ICD-10-CM | POA: Insufficient documentation

## 2012-11-24 DIAGNOSIS — I808 Phlebitis and thrombophlebitis of other sites: Secondary | ICD-10-CM | POA: Insufficient documentation

## 2012-11-24 MED ORDER — IBUPROFEN 800 MG PO TABS
800.0000 mg | ORAL_TABLET | Freq: Once | ORAL | Status: AC
  Administered 2012-11-24: 800 mg via ORAL
  Filled 2012-11-24: qty 1

## 2012-11-24 MED ORDER — HYDROCODONE-ACETAMINOPHEN 5-325 MG PO TABS: 1.0000 | ORAL_TABLET | ORAL | Status: AC | PRN

## 2012-11-24 MED ORDER — IBUPROFEN 800 MG PO TABS: 800.0000 mg | ORAL_TABLET | Freq: Three times a day (TID) | ORAL | Status: DC

## 2012-11-24 MED ORDER — HYDROCODONE-ACETAMINOPHEN 5-325 MG PO TABS
1.0000 | ORAL_TABLET | Freq: Once | ORAL | Status: AC
  Administered 2012-11-24: 1 via ORAL
  Filled 2012-11-24: qty 1

## 2012-11-24 NOTE — ED Notes (Signed)
Patient c/o right arm pain where he had IV placed last week.  Patient has red hardened streak where vein is.

## 2012-11-24 NOTE — ED Provider Notes (Signed)
History     CSN: [id number]  Arrival date & time 11/24/12  0054   First MD Initiated Contact with Patient 11/24/12 0106      Chief Complaint  Patient presents with  . Arm Pain    (Consider location/radiation/quality/duration/timing/severity/associated sxs/prior treatment) HPI [name] is a 52 y.o. male who presents to the Emergency Department complaining of pain and aching to the right upper arm after a visit to the ER last Wednesday. He received IVF on that day. He has had soreness to the arm since his visit.   PCP Dr. [name] Past Medical History  Diagnosis Date  . Hypertension   . Migraine     Past Surgical History  Procedure Date  . Hernia repair   . Knee surgery     No family history on file.  History  Substance Use Topics  . Smoking status: Current Every Day Smoker  . Smokeless tobacco: Not on file  . Alcohol Use: No      Review of Systems  Constitutional: Negative for fever.       10 Systems reviewed and are negative for acute change except as noted in the HPI.  HENT: Negative for congestion.   Eyes: Negative for discharge and redness.  Respiratory: Negative for cough and shortness of breath.   Cardiovascular: Negative for chest pain.  Gastrointestinal: Negative for vomiting and abdominal pain.  Musculoskeletal: Negative for back pain.       Right arm pain  Skin: Negative for rash.  Neurological: Negative for syncope, numbness and headaches.  Psychiatric/Behavioral:       No behavior change.    Allergies  Review of patient's allergies indicates no known allergies.  Home Medications   Current Outpatient Rx  Name  Route  Sig  Dispense  Refill  . ACETAMINOPHEN 500 MG PO TABS   Oral   Take 500 mg by mouth every 6 (six) hours as needed. For pain         . DILTIAZEM HCL ER COATED BEADS 120 MG PO CP24   Oral   Take 120 mg by mouth daily.         Marland Kitchen UNKNOWN TO PATIENT   Injection   Inject as directed every 3 (three) months. INJECTIONS  for MIGRAINES administered by Orie Rout, MD at [place], [place]         . ZOLMITRIPTAN 5 MG NA SOLN   Nasal   Place 1 spray into the nose as needed. For migraine         . ASPIRIN 325 MG PO TABS   Oral   Take 325 mg by mouth daily as needed. For pain         . HYDROCODONE-ACETAMINOPHEN 5-325 MG PO TABS   Oral   Take 1 tablet by mouth every 6 (six) hours as needed for pain.   10 tablet   0   . HYDROCODONE-ACETAMINOPHEN 5-325 MG PO TABS   Oral   Take 1 tablet by mouth every 4 (four) hours as needed for pain.   15 tablet   0   . IBUPROFEN 800 MG PO TABS   Oral   Take 1 tablet (800 mg total) by mouth 3 (three) times daily.   21 tablet   0     BP 185/101  Pulse 104  Temp 98.3 F (36.8 C) (Oral)  Resp 20  Ht 6' (1.829 m)  Wt 230 lb (104.327 kg)  BMI 31.19 kg/m2  SpO2  98%  Physical Exam  Nursing note and vitals reviewed. Constitutional:       Awake, alert, nontoxic appearance.  HENT:  Head: Atraumatic.  Eyes: Right eye exhibits no discharge. Left eye exhibits no discharge.  Neck: Neck supple.  Pulmonary/Chest: Effort normal. He exhibits no tenderness.  Abdominal: Soft. There is no tenderness. There is no rebound.  Musculoskeletal: He exhibits no tenderness.       Baseline ROM, no obvious new focal weakness.  Neurological:       Mental status and motor strength appears baseline for patient and situation.  Skin: No rash noted.       Right arm with residual IV line pain.  Psychiatric: He has a normal mood and affect.    ED Course  Procedures (including critical care time)    1. Superficial thrombophlebitis       MDM  Patient was in the ER last week and received IV Cardizem x 2. Now with aching pain to the vein distribution. Pt stable in ED with no significant deterioration in condition.The patient appears reasonably screened and/or stabilized for discharge and I doubt any other medical condition or other [facility] requiring  further screening, evaluation, or treatment in the ED at this time prior to discharge.  MDM Reviewed: nursing note and vitals           Gypsy Balsam. Olin Hauser, MD 11/24/12 [id number]

## 2012-11-24 NOTE — ED Notes (Signed)
States redness and soreness of right AC area began on Monday 11/20/2012 and has become progressively worse.  States he has a history of a blood clot in that arm years ago from treatment at [facility].  Discoloration along the vein from [facility] to approximately 6 inches

## 2012-12-26 ENCOUNTER — Ambulatory Visit (INDEPENDENT_AMBULATORY_CARE_PROVIDER_SITE_OTHER): Payer: BC Managed Care – PPO | Admitting: Urology

## 2013-01-02 ENCOUNTER — Other Ambulatory Visit: Payer: Self-pay | Admitting: Urology

## 2013-01-02 DIAGNOSIS — R972 Elevated prostate specific antigen [PSA]: Secondary | ICD-10-CM

## 2013-01-06 ENCOUNTER — Encounter: Payer: Self-pay | Admitting: *Deleted

## 2013-01-16 ENCOUNTER — Ambulatory Visit (HOSPITAL_COMMUNITY): Payer: BC Managed Care – PPO

## 2013-01-17 ENCOUNTER — Ambulatory Visit (INDEPENDENT_AMBULATORY_CARE_PROVIDER_SITE_OTHER): Payer: BC Managed Care – PPO | Admitting: Family Medicine

## 2013-01-17 ENCOUNTER — Encounter: Payer: Self-pay | Admitting: Family Medicine

## 2013-01-17 DIAGNOSIS — Z7901 Long term (current) use of anticoagulants: Secondary | ICD-10-CM

## 2013-01-17 LAB — POCT INR: INR: 1

## 2013-01-17 MED ORDER — WARFARIN SODIUM 5 MG PO TABS: 5.0000 mg | ORAL_TABLET | Freq: Every day | ORAL | Status: DC

## 2013-01-17 NOTE — Progress Notes (Signed)
INR 1.0 -patient stopped coumadin on March 23 for procedure on April 8.

## 2013-01-17 NOTE — Patient Instructions (Signed)
Resume coumadin at exact same dose. Maintain for at least two weeks. Re check inr in two weeks. May schedule biopsy later.

## 2013-01-17 NOTE — Progress Notes (Signed)
  Subjective:    Patient ID: [name], male    DOB: [birth year], 52 y.o.   MRN: [id number]  HPI Patient presents supposedly for just an INR. However he has significant concerns. He has just been off the Coumadin for 5 days do to pending prostate biopsy. This due to elevated PSA. This is been delayed for a week. Patient uncomfortable staying off the Coumadin for 2 weeks   Review of Systems ROS otherwise negative    Objective:   Physical Exam  No examination today      Assessment & Plan:  Impression anticoagulations-discussed. I agree with his concerns. Plan recommend resuming Coumadin same dose. Recheck INR in 2 weeks. delay biopsy for at least 2 weeks. Rationale discussed. Also, of note patient was prepped testing to [place] that we were not responding to [employer] regarding prescription requests. I educated him on the fact that this is a common intact at by [employer]. We always respond within 24 hours to requests during the week. WSL

## 2013-01-20 ENCOUNTER — Other Ambulatory Visit: Payer: Self-pay | Admitting: *Deleted

## 2013-01-20 DIAGNOSIS — Z7901 Long term (current) use of anticoagulants: Secondary | ICD-10-CM

## 2013-01-23 ENCOUNTER — Ambulatory Visit (HOSPITAL_COMMUNITY): Payer: BC Managed Care – PPO

## 2013-02-01 ENCOUNTER — Ambulatory Visit (INDEPENDENT_AMBULATORY_CARE_PROVIDER_SITE_OTHER): Payer: BC Managed Care – PPO | Admitting: *Deleted

## 2013-02-01 DIAGNOSIS — R972 Elevated prostate specific antigen [PSA]: Secondary | ICD-10-CM

## 2013-02-01 DIAGNOSIS — Z7901 Long term (current) use of anticoagulants: Secondary | ICD-10-CM

## 2013-02-09 ENCOUNTER — Ambulatory Visit (INDEPENDENT_AMBULATORY_CARE_PROVIDER_SITE_OTHER): Payer: BC Managed Care – PPO

## 2013-02-09 DIAGNOSIS — Z7901 Long term (current) use of anticoagulants: Secondary | ICD-10-CM

## 2013-02-16 ENCOUNTER — Other Ambulatory Visit (INDEPENDENT_AMBULATORY_CARE_PROVIDER_SITE_OTHER): Payer: BC Managed Care – PPO | Admitting: *Deleted

## 2013-02-16 DIAGNOSIS — Z7901 Long term (current) use of anticoagulants: Secondary | ICD-10-CM

## 2013-02-20 ENCOUNTER — Ambulatory Visit (HOSPITAL_COMMUNITY): Payer: BC Managed Care – PPO

## 2013-02-23 ENCOUNTER — Encounter: Payer: Self-pay | Admitting: Family Medicine

## 2013-03-02 ENCOUNTER — Ambulatory Visit: Payer: BC Managed Care – PPO

## 2013-03-06 ENCOUNTER — Ambulatory Visit (INDEPENDENT_AMBULATORY_CARE_PROVIDER_SITE_OTHER): Payer: BC Managed Care – PPO | Admitting: *Deleted

## 2013-03-06 DIAGNOSIS — Z7901 Long term (current) use of anticoagulants: Secondary | ICD-10-CM

## 2013-03-13 ENCOUNTER — Telehealth: Payer: Self-pay | Admitting: Family Medicine

## 2013-03-13 MED ORDER — LISINOPRIL 10 MG PO TABS: 10.0000 mg | ORAL_TABLET | Freq: Every day | ORAL | Status: DC

## 2013-03-13 NOTE — Telephone Encounter (Signed)
lisinopril (PRINIVIL,ZESTRIL) 10 MG tablet  Refill please  [place]

## 2013-03-17 ENCOUNTER — Other Ambulatory Visit: Payer: Self-pay | Admitting: *Deleted

## 2013-03-17 DIAGNOSIS — Z7901 Long term (current) use of anticoagulants: Secondary | ICD-10-CM

## 2013-04-06 ENCOUNTER — Ambulatory Visit: Payer: BC Managed Care – PPO

## 2013-04-09 ENCOUNTER — Other Ambulatory Visit: Payer: Self-pay | Admitting: *Deleted

## 2013-04-09 MED ORDER — WARFARIN SODIUM 5 MG PO TABS: 5.0000 mg | ORAL_TABLET | Freq: Every day | ORAL | Status: DC

## 2013-04-10 ENCOUNTER — Other Ambulatory Visit: Payer: Self-pay

## 2013-04-10 MED ORDER — WARFARIN SODIUM 5 MG PO TABS: 5.0000 mg | ORAL_TABLET | Freq: Every day | ORAL | Status: DC

## 2013-04-23 ENCOUNTER — Telehealth: Payer: Self-pay | Admitting: Family Medicine

## 2013-04-23 NOTE — Telephone Encounter (Signed)
Pt having dental work on or about May 08, 2013 and he needs a letter from Dr Richardson Landry stating he can be off his coumadin for about a week prior to this procedure. He will not be having the procedure on the 22nd but will need the letter for that office visit. Please call pt when letter is ready.

## 2013-04-26 NOTE — Telephone Encounter (Signed)
i need pt's chart

## 2013-04-27 NOTE — Telephone Encounter (Signed)
Chart in message pile 

## 2013-04-30 NOTE — Telephone Encounter (Signed)
Discussed with patient. He made an office visit with dr. Richardson Landry

## 2013-04-30 NOTE — Telephone Encounter (Signed)
Call pt with prior hx of clot, this is tricky. Pt needs ov to discuss risks of stoppping med vs bridge therapy and how to do--oer three mo since last ov anyway

## 2013-05-03 ENCOUNTER — Ambulatory Visit (INDEPENDENT_AMBULATORY_CARE_PROVIDER_SITE_OTHER): Payer: BC Managed Care – PPO | Admitting: Family Medicine

## 2013-05-03 ENCOUNTER — Encounter: Payer: Self-pay | Admitting: Family Medicine

## 2013-05-03 VITALS — BP 140/90 | HR 80 | Wt 232.4 lb

## 2013-05-03 DIAGNOSIS — Z86718 Personal history of other venous thrombosis and embolism: Secondary | ICD-10-CM

## 2013-05-03 NOTE — Progress Notes (Signed)
  Subjective:    Patient ID: [name], male    DOB: 1961-02-24, 52 y.o.   MRN: [id number]  HPI Patient arrives office for a protracted discussion. He has a history of deep venous clot in the right upper arm. This developed after an IV. Patient had a similar spell 6 years ago. At the end of this month patient will have been on Coumadin for 5 months. Handling it well. See prior notes. We have adjusted dosages based on INRs.  Positive history of hypertension compliant with medication.  Do to have dental surgery soon. We'll have all his lower teeth extracted. They may need to stop Coumadin Review of Systems No chest pain no shortness of breath no abdominal pain.    Objective:   Physical Exam Alert no acute distress. HEENT normal. Lungs clear. Heart regular rate and rhythm. Right arm good range of motion. No edema no tenderness.  Dentition very poor and tender to palpation lower jaw     Assessment & Plan:  Impression DVT. At time of dental surgery will have been on Coumadin for 5 months. Though very slight risk of clot, I do think we can stop the Coumadin one week before then. Rationale discussed. Also with this being the second DVT would like to get consultation after than [facility] hematologist about whether patient needs hypercoagulability workup and long-term Coumadin. WSL

## 2013-05-03 NOTE — Patient Instructions (Signed)
Stop your coumadin 7 days before your dental extractions  Start a daily baby aspirin the day following your procedure and stay off the coumadin as we set up a referral  We will work on referral

## 2013-05-21 ENCOUNTER — Other Ambulatory Visit: Payer: Self-pay | Admitting: *Deleted

## 2013-05-21 ENCOUNTER — Other Ambulatory Visit: Payer: Self-pay | Admitting: Family Medicine

## 2013-05-21 MED ORDER — DILTIAZEM HCL ER COATED BEADS 120 MG PO CP24: 120.0000 mg | ORAL_CAPSULE | Freq: Two times a day (BID) | ORAL | Status: DC

## 2013-06-05 ENCOUNTER — Encounter (HOSPITAL_COMMUNITY): Payer: BC Managed Care – PPO | Attending: Hematology and Oncology

## 2013-06-05 ENCOUNTER — Encounter (HOSPITAL_COMMUNITY): Payer: Self-pay

## 2013-06-05 VITALS — BP 180/101 | HR 77 | Temp 98.5°F | Resp 18 | Ht 72.0 in | Wt 233.0 lb

## 2013-06-05 DIAGNOSIS — Z7901 Long term (current) use of anticoagulants: Secondary | ICD-10-CM

## 2013-06-05 DIAGNOSIS — Z86718 Personal history of other venous thrombosis and embolism: Secondary | ICD-10-CM | POA: Insufficient documentation

## 2013-06-05 DIAGNOSIS — Z09 Encounter for follow-up examination after completed treatment for conditions other than malignant neoplasm: Secondary | ICD-10-CM | POA: Insufficient documentation

## 2013-06-05 LAB — D-DIMER, QUANTITATIVE: D-Dimer, Quant: <0.27 ug/mL-FEU (ref 0.00–0.48)

## 2013-06-05 NOTE — Progress Notes (Signed)
Patient History and Physical   [name] [id number] [birth year] 52 y.o. 06/05/2013  Referring MD: Dr Mikey Kirschner  Chief Complaint: 'History of DVTs'  HPI:  I had the pleasure of seeing [name], who is a 52 year old man tells me that her several years he wish he had gone to [place] system for treatment of his migraine( I do not have documents from [place] in this regard) .  He states that he had stayed over for about 3-4 days and had a PICC line in place in use her arm at that time. He states that he is returned to agree to the head and the day them progress the swelling not a and indeed I did see a right upper extremity which made him go to an urgent care clinic. Doppler ultrasound done 03/18/2006 showed  deep venous thrombosis in the cephalic and basilic veins on the right. Review of his records corroborates his account for his events. He states that he was hospitalized and treated with anticoagulation and ultimately ended up receiving about 3-4 months of anticoagulation.  He tells me again that he was not done she'll about February of 2014 when he had gone to the emergency room for an episode of headache.  In the emergency room he was treated with IV medications however 1 week later he started noticing redness pain and swelling in his right upper extremity.  Assess this he had gone to has gone back to the emergency room however he states that he did not receive any specific treatment at this time despite his symptoms which prompted him to go to [place] from [facility] which shows that the outlier ultrasound of the right upper extremity done 12/08/2012 showed extensive superficial thrombophlebitis of the cephalic vein along the visualized course. There was no evidence of  DVT in the upper extremities.   He tells me that he did not receiving about 5 months of anticoagulation which she stopped approximately 3 weeks ago.  He has no prior history of PE the had  not had any DVT since 2007.  He otherwise feels well.  His here today to discuss course of action in terms of continuing Coumadin which and was stopped because of dental surgery.  He tells me his yet to have the dental surgery.    PMH: Past Medical History  Diagnosis Date  . Hypertension   . Migraine   . Sleep apnea   . H/O blood clots 2008    Past Surgical History  Procedure Laterality Date  . Hernia repair    . Knee surgery      Allergies: Allergies  Allergen Reactions  . Toprol Xl [Metoprolol Tartrate]     sluggish    Medications: Current outpatient prescriptions:Aspirin-Acetaminophen (GOODYS BODY PAIN PO), Take by mouth., Disp: , Rfl: ;  diltiazem (CARDIZEM CD) 120 MG 24 hr capsule, TAKE ONE CAPSULE BY MOUTH TWICE DAILY, Disp: 60 capsule, Rfl: 1;  lisinopril (PRINIVIL,ZESTRIL) 10 MG tablet, Take 1 tablet (10 mg total) by mouth daily., Disp: 30 tablet, Rfl: 3;  modafinil (PROVIGIL) 200 MG tablet, Take 200 mg by mouth daily., Disp: , Rfl:  UNKNOWN TO PATIENT, Inject as directed every 3 (three) months. INJECTIONS for MIGRAINES administered by Orie Rout, MD at [place], [place], Disp: , Rfl: ;  zolmitriptan (ZOMIG) 5 MG nasal solution, Place 1 spray into the nose as needed. For migraine, Disp: , Rfl: ;  acetaminophen (TYLENOL) 500 MG tablet,  Take 500 mg by mouth every 6 (six) hours as needed. For pain, Disp: , Rfl:  ibuprofen (ADVIL,MOTRIN) 800 MG tablet, Take 800 mg by mouth every 8 (eight) hours as needed., Disp: , Rfl: ;  Oxymetazoline HCl (NASAL SPRAY NA), Place into the nose. Lidocaine 4% for migraines, Disp: , Rfl: ;  warfarin (COUMADIN) 5 MG tablet, Take 1 tablet (5 mg total) by mouth daily. Take 7.5 mg on Tuesday and Friday and 5 mg all other days, Disp: 60 tablet, Rfl: 5   Social History:   reports that he has been smoking.  He does not have any smokeless tobacco history on file. He reports that he does not drink alcohol or use illicit drugs.   Quit smoking 01/29/13. 1PPD for  About 30 years. Occasional ETOH. Works for [employer] in [place].  Family History: Family History  Problem Relation Age of Onset  . Stroke Mother   . Heart disease Father   . Diabetes Father   No family history of hypercoagulability.  Review of Systems: 14 point review of system is as in the history above otherwise negative.   Physical Exam: Blood pressure 180/101, pulse 77, temperature 98.5 F (36.9 C), temperature source Oral, resp. rate 18, height 6' (1.829 m), weight 233 lb (105.688 kg). GENERAL: No distress, well built and very muscular. SKIN:  No rashes or significant lesions , no ecchymosis or petechia or rash. HEAD: Normocephalic, No masses, lesions, tenderness or abnormalities  EYES: Conjunctiva are pink , non-injected and no jaundice. ENT: External ears normal ,lips, buccal mucosa, and tongue normal and mucous membranes are moist . LYMPH: No palpable lymphadenopathy,  In the neck supraclavicular areas. LUNGS: Clear to auscultation , no crackles or wheezes HEART: regular rate & rhythm, no murmurs, no gallops, S1 normal and S2 normal  ABDOMEN: Abdomen soft, non-tender, normal bowel sounds, no masses or organomegaly and no hepatosplenomegaly palpable.  MSK: No CVA tenderness and no tenderness on percussion of the back or rib cage. EXTREMITIES: No edema, no skin discoloration or tenderness. NEURO: Alert & oriented , no focal motor deficits.     Lab Results: Lab Results  Component Value Date   WBC 7.3 09/20/2012   HGB 13.6 09/20/2012   HCT 40.2 09/20/2012   MCV 68.3* 09/20/2012   PLT 197 09/20/2012     Chemistry      Component Value Date/Time   NA 138 09/20/2012 2116   K 3.5 09/20/2012 2116   CL 102 09/20/2012 2116   CO2 26 09/20/2012 2116   BUN 17 09/20/2012 2116   CREATININE 1.32 09/20/2012 2116      Component Value Date/Time   CALCIUM 9.4 09/20/2012 2116   ALKPHOS 100 12/27/2007 0000   AST 25 12/27/2007 0000   ALT 24 12/27/2007  0000   BILITOT 0.4 12/27/2007 0000       Impression: Mr. Mom had an episode of DVT in 2007 related to an IV according to his history. Since then he has not had any repeat DVT.   Most recently in February  2014, he had superficial thrombophlebitis without any evidence of DVT which was related to an IV line also and was her anticoagulant  For about  5 months. This in most cases will suffice even if patient had a DVT.  At present there is no indication for  Continuation of anticoagulation.  Patient also is not a candidate for hypercoagulability workup given that these events happened in the context of ill fated IV-lines.  Recommendations:  I will like to check  d-dimer based on PROLONG trial and have him return to clinic in 2 weeks, if d-dimer is normal, this will make a stronger case to  recommendation that no further of treatment is indicated at this time besides patient did not have DVT in February 2014.  On his return visit I want to decide if patient goes back to continue routine care with Dr [name].  Thank you Dr. [name] for referring this patient to [place].    All questions were satisfactorily answered.She knows to call if she has any concern.  I spent 50% of the time was spent counseling the patient face to face. The total time spent in the appointment was 45 minutes.   Verlan Friends, MD FACP. Hematology/Oncology.     Marland Kitchen

## 2013-06-05 NOTE — Progress Notes (Signed)
[name] presented for labwork. Labs per MD order drawn via Peripheral Line 23 gauge needle inserted in left AC  Good blood return present. Procedure without incident.  Needle removed intact. Patient tolerated procedure well.

## 2013-06-05 NOTE — Patient Instructions (Addendum)
[facility] Discharge Instructions  RECOMMENDATIONS MADE BY THE CONSULTANT AND ANY TEST RESULTS WILL BE SENT TO YOUR REFERRING PHYSICIAN.  EXAM FINDINGS BY THE PHYSICIAN TODAY AND SIGNS OR SYMPTOMS TO REPORT TO CLINIC OR PRIMARY PHYSICIAN: Exam and discussion by Dr. Alinda Deem. We will try to get your ultrasound report from [facility].  MEDICATIONS PRESCRIBED:  Stop the Coumadin.  INSTRUCTIONS GIVEN AND DISCUSSED: Will check a D dimer today  SPECIAL INSTRUCTIONS/FOLLOW-UP: Follow-up in 2 weeks to discuss test results.  Thank you for choosing [facility] to provide your oncology and hematology care.  To afford each patient quality time with our providers, please arrive at least 15 minutes before your scheduled appointment time.  With your help, our goal is to use those 15 minutes to complete the necessary work-up to ensure our physicians have the information they need to help with your evaluation and healthcare recommendations.    Effective January 1st, 2014, we ask that you re-schedule your appointment with our physicians should you arrive 10 or more minutes late for your appointment.  We strive to give you quality time with our providers, and arriving late affects you and other patients whose appointments are after yours.    Again, thank you for choosing [facility].  Our hope is that these requests will decrease the amount of time that you wait before being seen by our physicians.       _____________________________________________________________  Should you have questions after your visit to [facility], please contact our office at [phone] between the hours of 8:30 a.m. and 5:00 p.m.  Voicemails left after 4:30 p.m. will not be returned until the following business day.  For prescription refill requests, have your pharmacy contact our office with your prescription refill request.

## 2013-06-19 ENCOUNTER — Encounter (HOSPITAL_COMMUNITY): Payer: BC Managed Care – PPO | Attending: Hematology and Oncology

## 2013-06-19 VITALS — BP 165/110 | HR 75 | Temp 98.6°F | Resp 20

## 2013-06-19 DIAGNOSIS — Z86718 Personal history of other venous thrombosis and embolism: Secondary | ICD-10-CM | POA: Insufficient documentation

## 2013-06-19 DIAGNOSIS — Z09 Encounter for follow-up examination after completed treatment for conditions other than malignant neoplasm: Secondary | ICD-10-CM | POA: Insufficient documentation

## 2013-06-19 NOTE — Progress Notes (Signed)
      [facility] [phone]   Fax:([phone]) [place], MD [address] [place] [address]  DIAGNOSIS:  History of DVT  INTERVAL HISTORY:   TANIA FRIELING 52 y.o. male returns to the clinic today for scheduled a follow up. I had seen you on  06/05/2013 for initial evaluation of history of DVTs.  I had noted that in 2007 patient had upper extremity DVT related to an IV line and more recently in February of 2014 thrombophlebitis without any clear evidence of DVT.  As a result I had recommended that patient discontinue anticoagulation and asked him to come back today to review results of d-dimer which was less than 0.27.  He feels well and has not had any new problems since the last time I saw him.  He continues to do his work at a [employer] without problems.  He also denies any new upper or lower extremity swelling or pain.     PHYSICAL EXAMINATION:  Blood pressure 165/110, pulse 75, temperature 98.6 F (37 C), temperature source Oral, resp. rate 20. GENERAL: No acute distress. NEURO: Alert & oriented.    LABORATORY DATA:   RADIOGRAPHIC STUDIES: No results found.   ASSESSMENT:  History of DVT in 2007 which happened in the context of an IV line and thrombophlebitis in 2014 which is also related to IV line.  Again as I had  mentioned in my initial assessment, there is no indication for long-term anticoagulation at this time.  Besides d-dimer is not high which according to the PROLONG trial is associated with a lower risk of recurrence.  PLAN:  1. Return to clinic when necessary.   2. Counseled on the symptoms DVT or PE. 3. Continue his care with Dr.Luking. 4. Recommendation to stop anticoagulation is definite. 5. However, I warned patient asked he is at increased risk of having a DVT whenever an IV line such as PICC line is placed. I educated him on no signs of DVT/PE and to seek medical attention  promptly.    All questions were satisfactorily answered. Patient knows to call if  any concern arises.  I spent more than 50 % counseling the patient face to face. The total time spent in the appointment was 30 minutes.   Verlan Friends, MD FACP. Hematology/Oncology.   Thank you Dr. [name] for the opportunity to be part of this patient's care.

## 2013-06-19 NOTE — Patient Instructions (Signed)
.  [facility] Discharge Instructions  RECOMMENDATIONS MADE BY THE CONSULTANT AND ANY TEST RESULTS WILL BE SENT TO YOUR REFERRING PHYSICIAN.  EXAM FINDINGS BY THE PHYSICIAN TODAY AND SIGNS OR SYMPTOMS TO REPORT TO CLINIC OR PRIMARY PHYSICIAN:  No need for continuation of anticoagulant at this time  Released from clinic    Thank you for choosing [facility] to provide your oncology and hematology care.  To afford each patient quality time with our providers, please arrive at least 15 minutes before your scheduled appointment time.  With your help, our goal is to use those 15 minutes to complete the necessary work-up to ensure our physicians have the information they need to help with your evaluation and healthcare recommendations.    Effective January 1st, 2014, we ask that you re-schedule your appointment with our physicians should you arrive 10 or more minutes late for your appointment.  We strive to give you quality time with our providers, and arriving late affects you and other patients whose appointments are after yours.    Again, thank you for choosing [facility].  Our hope is that these requests will decrease the amount of time that you wait before being seen by our physicians.       _____________________________________________________________  Should you have questions after your visit to [facility], please contact our office at [phone] between the hours of 8:30 a.m. and 5:00 p.m.  Voicemails left after 4:30 p.m. will not be returned until the following business day.  For prescription refill requests, have your pharmacy contact our office with your prescription refill request.

## 2013-07-16 ENCOUNTER — Other Ambulatory Visit: Payer: Self-pay | Admitting: *Deleted

## 2013-07-16 MED ORDER — LISINOPRIL 10 MG PO TABS: 10.0000 mg | ORAL_TABLET | Freq: Every day | ORAL | Status: DC

## 2013-09-14 ENCOUNTER — Other Ambulatory Visit: Payer: Self-pay | Admitting: Family Medicine

## 2013-10-01 ENCOUNTER — Other Ambulatory Visit: Payer: Self-pay | Admitting: Family Medicine

## 2013-10-16 ENCOUNTER — Other Ambulatory Visit: Payer: Self-pay | Admitting: *Deleted

## 2013-11-29 ENCOUNTER — Other Ambulatory Visit: Payer: Self-pay | Admitting: Family Medicine

## 2013-12-31 ENCOUNTER — Other Ambulatory Visit: Payer: Self-pay | Admitting: Family Medicine

## 2014-01-18 ENCOUNTER — Ambulatory Visit (INDEPENDENT_AMBULATORY_CARE_PROVIDER_SITE_OTHER): Payer: BC Managed Care – PPO | Admitting: Family Medicine

## 2014-01-18 ENCOUNTER — Encounter: Payer: Self-pay | Admitting: Family Medicine

## 2014-01-18 VITALS — BP 170/100 | Ht 72.0 in | Wt 227.2 lb

## 2014-01-18 DIAGNOSIS — F329 Major depressive disorder, single episode, unspecified: Secondary | ICD-10-CM

## 2014-01-18 DIAGNOSIS — Z125 Encounter for screening for malignant neoplasm of prostate: Secondary | ICD-10-CM

## 2014-01-18 DIAGNOSIS — G4733 Obstructive sleep apnea (adult) (pediatric): Secondary | ICD-10-CM

## 2014-01-18 DIAGNOSIS — I10 Essential (primary) hypertension: Secondary | ICD-10-CM

## 2014-01-18 DIAGNOSIS — Z79899 Other long term (current) drug therapy: Secondary | ICD-10-CM

## 2014-01-18 DIAGNOSIS — F17200 Nicotine dependence, unspecified, uncomplicated: Secondary | ICD-10-CM

## 2014-01-18 DIAGNOSIS — E785 Hyperlipidemia, unspecified: Secondary | ICD-10-CM

## 2014-01-18 MED ORDER — ZOLMITRIPTAN 5 MG NA SOLN: 1.0000 | NASAL | Status: DC | PRN

## 2014-01-18 MED ORDER — LISINOPRIL 20 MG PO TABS: 20.0000 mg | ORAL_TABLET | Freq: Every day | ORAL | Status: DC

## 2014-01-18 MED ORDER — HYDROCHLOROTHIAZIDE 25 MG PO TABS: 25.0000 mg | ORAL_TABLET | Freq: Every day | ORAL | Status: DC

## 2014-01-18 MED ORDER — DILTIAZEM HCL ER COATED BEADS 120 MG PO CP24: 120.0000 mg | ORAL_CAPSULE | Freq: Two times a day (BID) | ORAL | Status: DC

## 2014-01-18 MED ORDER — KETOCONAZOLE 2 % EX CREA: 1.0000 "application " | TOPICAL_CREAM | Freq: Two times a day (BID) | CUTANEOUS | Status: DC

## 2014-01-18 MED ORDER — MODAFINIL 200 MG PO TABS: 200.0000 mg | ORAL_TABLET | Freq: Every day | ORAL | Status: DC

## 2014-01-18 NOTE — Progress Notes (Signed)
   Subjective:    Patient ID: [name], male    DOB: [birth year], 53 y.o.   MRN: [id number]  Hypertension This is a chronic problem. The current episode started more than 1 year ago. The problem is unchanged. The problem is controlled. There are no associated agents to hypertension. There are no known risk factors for coronary artery disease. Treatments tried: lisinopril. The current treatment provides no improvement. There are no compliance problems.   Patient states that his blood pressure has been running very high recently.   Patient had to go to his Workmen's Comp. doctor. They found elevated blood pressure. They added hydrochlorothiazide. Patient has been urinating somewhat more with it. No obvious side effects.  Really has migraine headaches these days.  Has recurrent rash in the groin. Worse when getting sweaty. Nizoral cream has helped in the past.  Went on to see the vascular specialist. They recommended not staying on Coumadin.  Elevated PSA over a year ago. Patient did not followup as directed. Did not go back to see the urologist.  Review of Systems No chest pain no back pain no headache no abdominal pain no change in bowel habits no blood in stool ROS otherwise negative    Objective:   Physical Exam  Alert HEENT normal. Lungs clear. Heart regular rate and rhythm. Ankles without edema. Blood pressure 136/88.     Assessment & Plan:  Impression hypertension suboptimal in. #2 history of recurrent DVTs discuss. #3 tenia cruris treatment discuss. #4 elevated PSA noncompliance discussed plan repeat blood work. May need to go back urologist. Increase lisinopril to 20 mg daily takes just one half a hydrochlorothiazide tablet. Followup as scheduled for wellness exam. WSL

## 2014-04-22 ENCOUNTER — Encounter: Payer: BC Managed Care – PPO | Admitting: Family Medicine

## 2014-06-13 ENCOUNTER — Encounter: Payer: BC Managed Care – PPO | Admitting: Family Medicine

## 2014-07-18 ENCOUNTER — Other Ambulatory Visit: Payer: Self-pay | Admitting: Family Medicine

## 2014-07-22 ENCOUNTER — Ambulatory Visit (INDEPENDENT_AMBULATORY_CARE_PROVIDER_SITE_OTHER): Payer: BC Managed Care – PPO | Admitting: Family Medicine

## 2014-07-22 ENCOUNTER — Encounter: Payer: Self-pay | Admitting: Family Medicine

## 2014-07-22 VITALS — BP 130/90 | Ht 71.0 in | Wt 226.4 lb

## 2014-07-22 DIAGNOSIS — Z125 Encounter for screening for malignant neoplasm of prostate: Secondary | ICD-10-CM

## 2014-07-22 DIAGNOSIS — Z0000 Encounter for general adult medical examination without abnormal findings: Secondary | ICD-10-CM

## 2014-07-22 DIAGNOSIS — Z79899 Other long term (current) drug therapy: Secondary | ICD-10-CM

## 2014-07-22 MED ORDER — DILTIAZEM HCL ER COATED BEADS 120 MG PO CP24: 120.0000 mg | ORAL_CAPSULE | Freq: Two times a day (BID) | ORAL | Status: DC

## 2014-07-22 MED ORDER — HYDROCHLOROTHIAZIDE 25 MG PO TABS: 25.0000 mg | ORAL_TABLET | Freq: Every day | ORAL | Status: DC

## 2014-07-22 MED ORDER — MODAFINIL 200 MG PO TABS: 200.0000 mg | ORAL_TABLET | Freq: Every day | ORAL | Status: DC

## 2014-07-22 MED ORDER — LISINOPRIL 20 MG PO TABS: ORAL_TABLET | ORAL | Status: DC

## 2014-07-22 MED ORDER — KETOCONAZOLE 2 % EX CREA: 1.0000 "application " | TOPICAL_CREAM | Freq: Two times a day (BID) | CUTANEOUS | Status: DC | PRN
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
## 2014-08-05 ENCOUNTER — Encounter (HOSPITAL_COMMUNITY): Payer: Self-pay | Admitting: Emergency Medicine

## 2014-08-05 ENCOUNTER — Emergency Department (HOSPITAL_COMMUNITY)
Admission: EM | Admit: 2014-08-05 | Discharge: 2014-08-05 | Disposition: A | Discharge status: Home / Self Care | Payer: BC Managed Care – PPO | Attending: Emergency Medicine | Admitting: Emergency Medicine

## 2014-08-05 ENCOUNTER — Emergency Department (HOSPITAL_COMMUNITY): Payer: BC Managed Care – PPO

## 2014-08-05 DIAGNOSIS — G43909 Migraine, unspecified, not intractable, without status migrainosus: Secondary | ICD-10-CM | POA: Insufficient documentation

## 2014-08-05 DIAGNOSIS — Z79899 Other long term (current) drug therapy: Secondary | ICD-10-CM | POA: Insufficient documentation

## 2014-08-05 DIAGNOSIS — Z86718 Personal history of other venous thrombosis and embolism: Secondary | ICD-10-CM | POA: Diagnosis not present

## 2014-08-05 DIAGNOSIS — I10 Essential (primary) hypertension: Secondary | ICD-10-CM | POA: Diagnosis not present

## 2014-08-05 DIAGNOSIS — Z87891 Personal history of nicotine dependence: Secondary | ICD-10-CM | POA: Insufficient documentation

## 2014-08-05 DIAGNOSIS — R109 Unspecified abdominal pain: Secondary | ICD-10-CM | POA: Insufficient documentation

## 2014-08-05 LAB — URINALYSIS, ROUTINE W REFLEX MICROSCOPIC
Bilirubin Urine: NEGATIVE
Glucose, UA: NEGATIVE mg/dL
Hgb urine dipstick: NEGATIVE
Ketones, ur: NEGATIVE mg/dL
LEUKOCYTES UA: NEGATIVE
Nitrite: NEGATIVE
PROTEIN: NEGATIVE mg/dL
Specific Gravity, Urine: >1.03 — ABNORMAL HIGH (ref 1.005–1.030)
UROBILINOGEN UA: 0.2 mg/dL (ref 0.0–1.0)
pH: 5.5 (ref 5.0–8.0)

## 2014-08-05 LAB — BASIC METABOLIC PANEL
Anion gap: 11 (ref 5–15)
BUN: 20 mg/dL (ref 6–23)
CALCIUM: 10 mg/dL (ref 8.4–10.5)
CHLORIDE: 101 meq/L (ref 96–112)
CO2: 30 meq/L (ref 19–32)
Creatinine, Ser: 1.5 mg/dL — ABNORMAL HIGH (ref 0.50–1.35)
GFR calc Af Amer: 59 mL/min — ABNORMAL LOW (ref >90)
GFR calc non Af Amer: 51 mL/min — ABNORMAL LOW (ref >90)
Glucose, Bld: 78 mg/dL (ref 70–99)
Potassium: 4 mEq/L (ref 3.7–5.3)
SODIUM: 142 meq/L (ref 137–147)

## 2014-08-05 LAB — CBC WITH DIFFERENTIAL/PLATELET
BASOS ABS: 0 10*3/uL (ref 0.0–0.1)
Basophils Relative: 0 % (ref 0–1)
Eosinophils Absolute: 0.1 10*3/uL (ref 0.0–0.7)
Eosinophils Relative: 1 % (ref 0–5)
HCT: 40 % (ref 39.0–52.0)
Hemoglobin: 13.4 g/dL (ref 13.0–17.0)
LYMPHS PCT: 27 % (ref 12–46)
Lymphs Abs: 2 10*3/uL (ref 0.7–4.0)
MCH: 23.1 pg — ABNORMAL LOW (ref 26.0–34.0)
MCHC: 33.5 g/dL (ref 30.0–36.0)
MCV: 69.1 fL — ABNORMAL LOW (ref 78.0–100.0)
MONOS PCT: 8 % (ref 3–12)
Monocytes Absolute: 0.6 10*3/uL (ref 0.1–1.0)
NEUTROS PCT: 64 % (ref 43–77)
Neutro Abs: 4.8 10*3/uL (ref 1.7–7.7)
PLATELETS: 219 10*3/uL (ref 150–400)
RBC: 5.79 MIL/uL (ref 4.22–5.81)
RDW: 14.9 % (ref 11.5–15.5)
WBC: 7.5 10*3/uL (ref 4.0–10.5)

## 2014-08-05 MED ORDER — KETOROLAC TROMETHAMINE 30 MG/ML IJ SOLN
30.0000 mg | Freq: Once | INTRAMUSCULAR | Status: AC
  Administered 2014-08-05: 30 mg via INTRAVENOUS
  Filled 2014-08-05: qty 1

## 2014-08-05 MED ORDER — ONDANSETRON HCL 4 MG/2ML IJ SOLN
4.0000 mg | Freq: Once | INTRAMUSCULAR | Status: AC
  Administered 2014-08-05: 4 mg via INTRAVENOUS
  Filled 2014-08-05: qty 2

## 2014-08-05 MED ORDER — MORPHINE SULFATE 4 MG/ML IJ SOLN
4.0000 mg | Freq: Once | INTRAMUSCULAR | Status: AC
  Administered 2014-08-05: 4 mg via INTRAVENOUS
  Filled 2014-08-05: qty 1

## 2014-08-05 MED ORDER — HYDROCODONE-ACETAMINOPHEN 5-325 MG PO TABS: 1.0000 | ORAL_TABLET | Freq: Four times a day (QID) | ORAL | Status: DC | PRN

## 2014-08-05 NOTE — ED Notes (Signed)
Patient complaining of left flank pain x 1 month. States pain is worse today.

## 2014-08-05 NOTE — Discharge Instructions (Signed)
Ibuprofen 600 mg 3 times daily for the next 3 days.  Hydrocodone as prescribed as needed for pain not relieved with ibuprofen.  Followup with your primary Dr. if not improving in the next several days, and return to the ER if your symptoms substantially worsen or change.   Flank Pain Flank pain refers to pain that is located on the side of the body between the upper abdomen and the back. The pain may occur over a short period of time (acute) or may be long-term or reoccurring (chronic). It may be mild or severe. Flank pain can be caused by many things. CAUSES  Some of the more common causes of flank pain include:  Muscle strains.   Muscle spasms.   A disease of your spine (vertebral disk disease).   A lung infection (pneumonia).   Fluid around your lungs (pulmonary edema).   A kidney infection.   Kidney stones.   A very painful skin rash caused by the chickenpox virus (shingles).   Gallbladder disease.  [place] care will depend on the cause of your pain. In general,  Rest as directed by your caregiver.  Drink enough fluids to keep your urine clear or pale yellow.  Only take over-the-counter or prescription medicines as directed by your caregiver. Some medicines may help relieve the pain.  Tell your caregiver about any changes in your pain.  Follow up with your caregiver as directed. SEEK IMMEDIATE MEDICAL CARE IF:   Your pain is not controlled with medicine.   You have new or worsening symptoms.  Your pain increases.   You have abdominal pain.   You have shortness of breath.   You have persistent nausea or vomiting.   You have swelling in your abdomen.   You feel faint or pass out.   You have blood in your urine.  You have a fever or persistent symptoms for more than 2-3 days.  You have a fever and your symptoms suddenly get worse. MAKE SURE YOU:   Understand these instructions.  Will watch your  condition.  Will get help right away if you are not doing well or get worse. Document Released: 11/25/2005 Document Revised: 06/28/2012 Document Reviewed: 05/18/2012 [facility] Patient Information 2015 [place], [place]. This information is not intended to replace advice given to you by your health care provider. Make sure you discuss any questions you have with your health care provider.

## 2014-08-05 NOTE — ED Notes (Signed)
Pt alert & oriented x4, stable gait. Patient given discharge instructions, paperwork & prescription(s). Patient  instructed to stop at the registration desk to finish any additional paperwork. Patient verbalized understanding. Pt left department w/ no further questions. 

## 2014-08-05 NOTE — ED Provider Notes (Signed)
CSN: [id number]     Arrival date & time 08/05/14  1346 History  This chart was scribed for John Speak, MD by Edison Simon, ED Scribe. This patient was seen in room APA07/APA07 and the patient's care was started at 6:41 PM.    Chief Complaint  Patient presents with  . Flank Pain   Patient is a 53 y.o. male presenting with flank pain. The history is provided by the patient. No language interpreter was used.  Flank Pain This is a new problem. The current episode started more than 1 week ago. The problem occurs every several days. Nothing aggravates the symptoms. Nothing relieves the symptoms. He has tried nothing for the symptoms.    HPI Comments: [name] is a 53 y.o. male who presents to the Emergency Department complaining of left flank and back pain intermittently with onset 1 month ago, worse today. He states it lasts a few days at a time. He states the pain is worse when sitting up, standing, and walking. He states he occasionally has constipation. He states he has not had similar symptoms previously and denies history of kidney stones; he also denies prior diagnosis of diverticulitis or diverticulosis. He denies previous surgeries to his stomach besides an appendectomy when he was a child. He denies prior colonoscopy. He denies dysuria, difficulty urinating, hematuria, nausea, vomiting, or diarrhea.  Past Medical History  Diagnosis Date  . Hypertension   . Migraine   . Sleep apnea   . H/O blood clots 2008   Past Surgical History  Procedure Laterality Date  . Hernia repair    . Knee surgery     Family History  Problem Relation Age of Onset  . Stroke Mother   . Heart disease Father   . Diabetes Father    History  Substance Use Topics  . Smoking status: Former Research scientist (life sciences)  . Smokeless tobacco: Not on file  . Alcohol Use: No    Review of Systems  Genitourinary: Positive for flank pain.  All other systems reviewed and are negative. A complete 10 system review of systems  was obtained and all systems are negative except as noted in the HPI and PMH.   Allergies  Toprol xl  Home Medications   Prior to Admission medications   Medication Sig Start Date End Date Taking? Authorizing Provider  diltiazem (CARDIZEM CD) 120 MG 24 hr capsule Take 1 capsule (120 mg total) by mouth 2 (two) times daily. 07/22/14   Mikey Kirschner, MD  hydrochlorothiazide (HYDRODIURIL) 25 MG tablet Take 1 tablet (25 mg total) by mouth daily. 07/22/14   Mikey Kirschner, MD  ketoconazole (NIZORAL) 2 % cream Apply 1 application topically 2 (two) times daily as needed for irritation. 07/22/14   Mikey Kirschner, MD  lisinopril (PRINIVIL,ZESTRIL) 20 MG tablet TAKE ONE TABLET BY MOUTH ONCE DAILY 07/22/14   Mikey Kirschner, MD  modafinil (PROVIGIL) 200 MG tablet Take 1 tablet (200 mg total) by mouth daily. 07/22/14   Mikey Kirschner, MD  UNKNOWN TO PATIENT Inject as directed every 3 (three) months. INJECTIONS for MIGRAINES administered by Orie Rout, MD at [facility], [place], [place]    Historical Provider, MD   BP 131/81  Pulse 61  Temp(Src) 98.2 F (36.8 C) (Oral)  Resp 16  Ht 6' (1.829 m)  Wt 230 lb (104.327 kg)  BMI 31.19 kg/m2  SpO2 100% Physical Exam  Nursing note and vitals reviewed. Constitutional: He is oriented to person, place, and  time. He appears well-developed and well-nourished.  HENT:  Head: Normocephalic and atraumatic.  Eyes: Conjunctivae and EOM are normal.  Neck: Normal range of motion. Neck supple.  Cardiovascular: Normal rate, regular rhythm and normal heart sounds.   No murmur heard. Pulmonary/Chest: Effort normal and breath sounds normal. No respiratory distress. He has no wheezes. He has no rales.  Abdominal: Soft. Bowel sounds are normal. He exhibits no distension. There is tenderness. There is no rebound and no guarding.  Tender to palpation in left flank  Musculoskeletal: Normal range of motion.  Neurological: He is alert and oriented to  person, place, and time.  Skin: Skin is warm and dry.  Psychiatric: He has a normal mood and affect.    ED Course  Procedures (including critical care time) Labs Review Labs Reviewed  URINALYSIS, ROUTINE W REFLEX MICROSCOPIC - Abnormal; Notable for the following:    Specific Gravity, Urine >1.030 (*)    All other components within normal limits  CBC WITH DIFFERENTIAL - Abnormal; Notable for the following:    MCV 69.1 (*)    MCH 23.1 (*)    All other components within normal limits  BASIC METABOLIC PANEL - Abnormal; Notable for the following:    Creatinine, Ser 1.50 (*)    GFR calc non Af Amer 51 (*)    GFR calc Af Amer 59 (*)    All other components within normal limits    Imaging Review No results found.   EKG Interpretation None     DIAGNOSTIC STUDIES: Oxygen Saturation is 100% on room air, normal by my interpretation.    COORDINATION OF CARE: 6:47 PM Discussed treatment plan with patient at beside, including CAT scan to look for kidney stone or diverticulitis. Discussed with him his blood work that shows no evidence of elevated white blood cell count. The patient agrees with the plan and has no further questions at this time.  MDM   Final diagnoses:  None    Patient is a 53 year old male with a several day history of left flank pain. He denies any injury or trauma and denies any bowel or bladder complaints. His urinalysis is clear and CT scan does not reveal a kidney stone or other pathology that would explain his symptoms. He is feeling better with medications here in the ER. At this point, I do not feel as though there is an emergent process and believe he is appropriate for discharge. I will prescribe a small quantity of pain medication which he can take. He understands to return in the meanwhile if his symptoms substantially worsen or change.  I personally performed the services described in this documentation, which was scribed in my presence. The recorded  information has been reviewed and is accurate.      John Speak, MD 08/05/14 [phone]

## 2014-08-05 NOTE — ED Notes (Signed)
EDP at bedside  

## 2014-08-12 ENCOUNTER — Encounter: Payer: Self-pay | Admitting: Family Medicine

## 2014-08-12 ENCOUNTER — Ambulatory Visit (INDEPENDENT_AMBULATORY_CARE_PROVIDER_SITE_OTHER): Payer: BC Managed Care – PPO | Admitting: Family Medicine

## 2014-08-12 VITALS — BP 150/90 | Ht 71.0 in | Wt 235.0 lb

## 2014-08-12 DIAGNOSIS — M545 Low back pain, unspecified: Secondary | ICD-10-CM

## 2014-08-12 LAB — POCT URINALYSIS DIPSTICK
Spec Grav, UA: 1.025
pH, UA: 5

## 2014-08-12 MED ORDER — HYDROCODONE-ACETAMINOPHEN 5-325 MG PO TABS: ORAL_TABLET | ORAL | Status: DC

## 2014-08-12 MED ORDER — ETODOLAC 400 MG PO TABS: 400.0000 mg | ORAL_TABLET | Freq: Two times a day (BID) | ORAL | Status: DC

## 2014-08-12 MED ORDER — TIZANIDINE HCL 4 MG PO CAPS: 4.0000 mg | ORAL_CAPSULE | Freq: Two times a day (BID) | ORAL | Status: DC | PRN

## 2014-08-12 NOTE — Progress Notes (Signed)
   Subjective:    Patient ID: [name], male    DOB: 1961-05-07, 53 y.o.   MRN: [id number]  HPI Comments: Martin Majestic to the ER on 10/19. They did a CT scan. He believes he has a kidney stone.   Flank Pain This is a new problem. Episode onset: 10/19. The problem occurs constantly. Pain location: Left flank. Radiates to: pelvis. The pain is moderate. Associated symptoms include pelvic pain.   Results for orders placed in visit on 08/12/14  POCT URINALYSIS DIPSTICK      Result Value Ref Range   Color, UA       Clarity, UA       Glucose, UA       Bilirubin, UA       Ketones, UA       Spec Grav, UA 1.025     Blood, UA       pH, UA 5.0     Protein, UA       Urobilinogen, UA       Nitrite, UA       Leukocytes, UA        Pain is severe. Left lumbar region. Minimal radiation. Definitely worse with motion. Some radiation to left posterior hip.  Unable to perform his current job with the intensity of pain.  Had a rather thorough workup at the emergency room with negative workup for blocking kidney stones. Unfortunately the tech told the patient that they saw a kidney stone. The ER doctor did not mention it. This confuses and worries the patient.  Review of Systems  Genitourinary: Positive for flank pain and pelvic pain.  no rash no fever no vomiting     Objective:   Physical Exam Alert no apparent distress HEENT normal. Lungs clear. Heart regular rate and rhythm. Negative straight leg raise. Spine nontender. Left lumbar region tenderness deep palpation  ER note completely reviewed     Assessment & Plan:  Impression lumbar strain. #2 kidney stone tiny nonobstructing stone noted at the kidney level. Communication at length with patient described how this is not the source of his pain. Plan symptomatic care discussed. Anti-inflammatory medication. Anaspaz medicine. When necessary narcotic medicine work excuse easily 25 minutes spent most and discussion. WS

## 2014-08-19 ENCOUNTER — Encounter: Payer: Self-pay | Admitting: Family Medicine

## 2014-08-19 ENCOUNTER — Ambulatory Visit (INDEPENDENT_AMBULATORY_CARE_PROVIDER_SITE_OTHER): Payer: BC Managed Care – PPO | Admitting: Family Medicine

## 2014-08-19 ENCOUNTER — Ambulatory Visit (HOSPITAL_COMMUNITY)
Admission: RE | Admit: 2014-08-19 | Discharge: 2014-08-19 | Disposition: A | Discharge status: Home / Self Care | Payer: BC Managed Care – PPO | Source: Ambulatory Visit | Attending: Family Medicine | Admitting: Family Medicine

## 2014-08-19 VITALS — BP 148/90 | Ht 71.0 in | Wt 234.0 lb

## 2014-08-19 DIAGNOSIS — M545 Low back pain, unspecified: Secondary | ICD-10-CM

## 2014-08-19 MED ORDER — TIZANIDINE HCL 4 MG PO CAPS: 4.0000 mg | ORAL_CAPSULE | Freq: Two times a day (BID) | ORAL | Status: DC | PRN

## 2014-08-19 MED ORDER — ETODOLAC 400 MG PO TABS: 400.0000 mg | ORAL_TABLET | Freq: Two times a day (BID) | ORAL | Status: DC

## 2014-08-19 MED ORDER — HYDROCODONE-ACETAMINOPHEN 5-325 MG PO TABS: ORAL_TABLET | ORAL | Status: DC

## 2014-08-19 NOTE — Progress Notes (Signed)
   Subjective:    Patient ID: [name], male    DOB: Jan 25, 1961, 52 y.o.   MRN: [id number]  HPI Patient is here today for a f/u visit from 10/26 for low back pain on his left side.  He said he feels the same.  The meds are just masking the pain, they are not helping him. He is taking the meds that were prescribed. States he is only taking one or two hydrocodone daily. He will be at a pain level of 8 before taking the med, and then a 5 after the med kicks in. He does not want to take the hydrocodone too often.   States pain has improved a little but not much. A lot of difficulty with discomfort still. At most radiates into left hip and occasionally thigh  Review of Systems No vomiting no diarrhea no chest pain no abdominal pain    Objective:   Physical Exam  Alert no acute distress. Lungs clear heart regular rate and rhythm. Pain is in left peri-lumbar region. No major tenderness to palpation. Negative straight leg raise      Assessment & Plan:  Impression lumbar strain with spasm plan low back x-rays. Physical therapy consult recheck in 2 weeks. Warning signs discussed. W SL

## 2014-08-22 ENCOUNTER — Telehealth: Payer: Self-pay | Admitting: Family Medicine

## 2014-08-22 NOTE — Telephone Encounter (Signed)
This form was faxed over from [place] form his short term disability and he needs it done as soon as possible. There are some areas I cant fill in because you would know more about it. Just need the highlighted area filled in please so I can fax over with office notes as soon as possible.He called needing this week.

## 2014-09-03 ENCOUNTER — Encounter: Payer: Self-pay | Admitting: Family Medicine

## 2014-09-03 ENCOUNTER — Ambulatory Visit (INDEPENDENT_AMBULATORY_CARE_PROVIDER_SITE_OTHER): Payer: BC Managed Care – PPO | Admitting: Family Medicine

## 2014-09-03 ENCOUNTER — Ambulatory Visit (HOSPITAL_COMMUNITY)
Admission: RE | Admit: 2014-09-03 | Discharge: 2014-09-03 | Disposition: A | Discharge status: Home / Self Care | Payer: BC Managed Care – PPO | Source: Ambulatory Visit | Attending: Family Medicine | Admitting: Family Medicine

## 2014-09-03 VITALS — BP 132/90 | Ht 72.0 in | Wt 234.0 lb

## 2014-09-03 DIAGNOSIS — M545 Low back pain, unspecified: Secondary | ICD-10-CM

## 2014-09-03 DIAGNOSIS — M25652 Stiffness of left hip, not elsewhere classified: Secondary | ICD-10-CM | POA: Diagnosis not present

## 2014-09-03 DIAGNOSIS — M1612 Unilateral primary osteoarthritis, left hip: Secondary | ICD-10-CM

## 2014-09-03 DIAGNOSIS — M6281 Muscle weakness (generalized): Secondary | ICD-10-CM

## 2014-09-03 DIAGNOSIS — M25552 Pain in left hip: Secondary | ICD-10-CM | POA: Insufficient documentation

## 2014-09-03 DIAGNOSIS — R262 Difficulty in walking, not elsewhere classified: Secondary | ICD-10-CM

## 2014-09-03 DIAGNOSIS — Z5189 Encounter for other specified aftercare: Secondary | ICD-10-CM | POA: Diagnosis present

## 2014-09-03 DIAGNOSIS — R29898 Other symptoms and signs involving the musculoskeletal system: Secondary | ICD-10-CM

## 2014-09-03 MED ORDER — ETODOLAC 400 MG PO TABS: 400.0000 mg | ORAL_TABLET | Freq: Two times a day (BID) | ORAL | Status: DC

## 2014-09-03 NOTE — Progress Notes (Signed)
   Subjective:    Patient ID: [name], male    DOB: 1961/07/03, 53 y.o.   MRN: [id number]  Back Pain This is a recurrent problem. The pain is present in the lumbar spine.  Follow up on back pain. Starting physical therapy today. Taking hydrocodone and zanaflex. Pain is better.   Pain overall is improved  Flared up ths weekend aftter lifting  Not back to work yet  fmla covered pt up til today  Out of work thru tod via excuse  PT at the new facility ;  Review of Systems  Musculoskeletal: Positive for back pain.       Objective:   Physical Exam Alert mild malaise when moving about stable blood pressure on repeat good at 130/78. Lungs clear heart regular rate and rhythm.       Assessment & Plan:  Lumbar strain, ongoing discomfort and spasm at times. Not ready to go back to workplan anti-inflammatory medicine refilled. Patient averaging 1 hydrocodone per day. Physical therapy just started. Extend work excuse one more week anticipate ability to get back to work the following week. WSL
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
## 2014-09-05 ENCOUNTER — Ambulatory Visit (HOSPITAL_COMMUNITY)
Admission: RE | Admit: 2014-09-05 | Discharge: 2014-09-05 | Disposition: A | Discharge status: Home / Self Care | Payer: BC Managed Care – PPO | Source: Ambulatory Visit | Attending: Family Medicine | Admitting: Family Medicine

## 2014-09-05 DIAGNOSIS — M1612 Unilateral primary osteoarthritis, left hip: Secondary | ICD-10-CM

## 2014-09-05 DIAGNOSIS — Z5189 Encounter for other specified aftercare: Secondary | ICD-10-CM | POA: Diagnosis not present

## 2014-09-05 DIAGNOSIS — R29898 Other symptoms and signs involving the musculoskeletal system: Secondary | ICD-10-CM

## 2014-09-05 DIAGNOSIS — M6281 Muscle weakness (generalized): Secondary | ICD-10-CM

## 2014-09-05 DIAGNOSIS — R262 Difficulty in walking, not elsewhere classified: Secondary | ICD-10-CM

## 2014-09-05 DIAGNOSIS — M25552 Pain in left hip: Secondary | ICD-10-CM

## 2014-09-05 NOTE — Therapy (Signed)
Physical Therapy Treatment  Patient Details  Name: [name] MRN: [id number] Date of Birth: March 19, 1961  Encounter Date: 09/05/2014      PT End of Session - 09/05/14 1214    Visit Number 2   Number of Visits 16   Date for PT Re-Evaluation 10/03/14   Authorization Type BCBS   Authorization - Visit Number 2   Authorization - Number of Visits 16   PT Start Time 1020   PT Stop Time 1108   PT Time Calculation (min) 48 min   Activity Tolerance Patient tolerated treatment well;Patient limited by pain   Behavior During Therapy [facility] for tasks assessed/performed      Past Medical History  Diagnosis Date  . Hypertension   . Migraine   . Sleep apnea   . H/O blood clots 2008    Past Surgical History  Procedure Laterality Date  . Hernia repair    . Knee surgery      There were no vitals taken for this visit.  Visit Diagnosis:  Primary osteoarthritis of left hip  Left hip pain  Weakness of left hip  Weakness of trunk musculature  Difficulty walking      Subjective Assessment - 09/05/14 1022    Symptoms Current pain scale 7/10 Lt lower back region   Currently in Pain? Yes   Pain Score 7    Pain Location Back   Pain Orientation Left;Lower            [facility] Adult PT Treatment/Exercise - 09/05/14 0001    Bed Mobility   Bed Mobility Sit to Sidelying Left;Left Sidelying to Sit   Left Sidelying to Sit 6: Modified independent (Device/Increase time)  instructed proper bed mobilty   Sit to Sidelying Left 6: Modified independent (Device/Increase time)  instructed proper bed mobilty   Exercises   Exercises Lumbar   Lumbar Exercises: Stretches   Active Hamstring Stretch 3 reps;30 seconds;Limitations   Active Hamstring Stretch Limitations standing with 14 in step   Piriformis Stretch 3 reps;30 seconds;Limitations   Piriformis Stretch Limitations seated    Lumbar Exercises: Aerobic   Tread Mill 5' with incline 3 x 5' following MET   Lumbar Exercises: Standing   Other Standing Lumbar Exercises Gastroc stretch 3x 30" BIl LE against wall   Lumbar Exercises: Seated   Other Seated Lumbar Exercises anterior.posterior rotation   Manual Therapy   Manual Therapy Other (comment)   Joint Mobilization Muscle energy technique for Lt posterior rotation and Rt anterior rotation f/b gait training on treadmile and stretches             PT Short Term Goals - 09/05/14 1307    PT SHORT TERM GOAL #1   Title Patient will be able to side bend through lumbar spine 25 degrees bilaterally to more easily reach into deep pockets   Status On-going   PT SHORT TERM GOAL #2   Title Patient will dmeosntrate good hip alignment upon arrival to therapy and dmeosntrate trunk flexion/extesnion strength of 3+/5 indicatign improve hip stability.   Status On-going   PT SHORT TERM GOAL #3   Title Patient will demonstrate increased hip internal rotation to mobility to 30 degrees bilaterally and External rotation to 40 degrees bilaterally   PT SHORT TERM GOAL #4   Title Patient will dmeosntrate increased hipp abduction ROM of 45 degrees bilaterally to more easily get on and off his motor cycle   PT SHORT TERM GOAL #5   Title Patient will dmeonstrate  increased hip abduction and extension strength of 3/5 MMT          PT Long Term Goals - 09/05/14 1308    PT LONG TERM GOAL #1   Title Patient will dmeonstrate increased hip internal rotation of 40 degrees bilaterally and a negative piriformis test to be able to sit cross legged comfortabley   PT LONG TERM GOAL #2   Title Patient will dmeonstrate increased Hip extension strength of 4+/5 MMT to be able to lift 30+ lb from the floor for work   PT [place] #3   Title Patient will demostrate increased strunk flexion/extension strength of 4+/5 to be able to maintain correct hip alignement and work on hands and knees withtou pain   PT LONG TERM GOAL #4   Title Patient will be able to ride motor cycle and get on and off it withtou  pain.           Plan - 09/05/14 1215    Clinical Impression Statement (p) Pt limited by pain through session.  Muscle energy techniques complete to improve SI alignment with educated on proper alignment.  Gait training complete with cueing for posture and pelvic floor control during gait to assist with SI alignment.  Pt instructed LE stretches and given HEP printout for stretches complete this session.   PT Next Visit Plan (p) Initial focus of theray to be on improving pain correcting Lt hip posterior rotation an dimproving LE flexibility to normalize gait. Introduce: hip flexor, groin, hamstring, calf, and priformis stretches next session        Problem List Patient Active Problem List   Diagnosis Date Noted  . SYNCOPE 01/09/2009  . SMOKER 08/28/2008  . DEEP VENOUS THROMBOPHLEBITIS-RIGHT UPPER EXTREMITY 08/28/2008  . MICROCYTOSIS 12/29/2007  . HYPERLIPIDEMIA 05/03/2007  . DEPRESSION, MAJOR 05/03/2007  . SLEEP APNEA, OBSTRUCTIVE, MILD 01/18/2007  . OBESITY NOS 09/22/2006  . DEPRESSION 09/22/2006  . HYPERTENSION 09/22/2006  . DVT, HX OF 09/22/2006   Ihor Austin, [place] Aldona Lento 09/05/2014, 1:10 PM

## 2014-09-10 ENCOUNTER — Ambulatory Visit (HOSPITAL_COMMUNITY)
Admission: RE | Admit: 2014-09-10 | Discharge: 2014-09-10 | Disposition: A | Discharge status: Home / Self Care | Payer: BC Managed Care – PPO | Source: Ambulatory Visit | Attending: Family Medicine | Admitting: Family Medicine

## 2014-09-10 DIAGNOSIS — M25552 Pain in left hip: Secondary | ICD-10-CM

## 2014-09-10 DIAGNOSIS — M1612 Unilateral primary osteoarthritis, left hip: Secondary | ICD-10-CM

## 2014-09-10 DIAGNOSIS — R262 Difficulty in walking, not elsewhere classified: Secondary | ICD-10-CM

## 2014-09-10 DIAGNOSIS — Z5189 Encounter for other specified aftercare: Secondary | ICD-10-CM | POA: Diagnosis not present

## 2014-09-10 DIAGNOSIS — R29898 Other symptoms and signs involving the musculoskeletal system: Secondary | ICD-10-CM

## 2014-09-10 DIAGNOSIS — M6281 Muscle weakness (generalized): Secondary | ICD-10-CM

## 2014-09-10 NOTE — Therapy (Signed)
Physical Therapy Treatment  Patient Details  Name: [name] MRN: [id number] Date of Birth: [birth year]  Encounter Date: 09/10/2014      PT End of Session - 09/10/14 1235    Visit Number 3   Number of Visits 16   Date for PT Re-Evaluation 10/03/14   Authorization Type BCBS   Authorization - Visit Number 3   Authorization - Number of Visits 16   PT Start Time [id number]   PT Stop Time 1231   PT Time Calculation (min) 44 min   Activity Tolerance Patient tolerated treatment well;Patient limited by pain   Behavior During Therapy [facility] for tasks assessed/performed      Past Medical History  Diagnosis Date  . Hypertension   . Migraine   . Sleep apnea   . H/O blood clots 2008    Past Surgical History  Procedure Laterality Date  . Hernia repair    . Knee surgery      There were no vitals taken for this visit.  Visit Diagnosis:  Primary osteoarthritis of left hip  Left hip pain  Weakness of left hip  Weakness of trunk musculature  Difficulty walking      Subjective Assessment - 09/10/14 1154    Symptoms Patient states pain and and walkign is feeling much better   Currently in Pain? Yes   Pain Score 5             [facility] Adult PT Treatment/Exercise - 09/10/14 1244    Lumbar Exercises: Stretches   Active Hamstring Stretch 3 reps;Limitations;20 seconds   Active Hamstring Stretch Limitations standing with 14 in step   ITB Stretch 20 seconds;3 reps   ITB Stretch Limitations to 6" box   Piriformis Stretch 3 reps;Limitations;20 seconds   Piriformis Stretch Limitations seated    Lumbar Exercises: Standing   Functional Squats 10 reps   Functional Squats Limitations squat walk around 10x   Other Standing Lumbar Exercises Gastroc stretch 3x 30" BIl LE against wall   Lumbar Exercises: Seated   Other Seated Lumbar Exercises anterior.posterior rotation   Lumbar Exercises: Supine   Bridge 20 reps   Manual Therapy   Manual Therapy muscle energy technique to correct  hip alignment            PT Short Term Goals - 09/10/14 1239    PT SHORT TERM GOAL #1   Title Patient will be able to side bend through lumbar spine 25 degrees bilaterally to more easily reach into deep pockets   Status On-going   PT SHORT TERM GOAL #2   Title Patient will dmeosntrate good hip alignment upon arrival to therapy and dmeosntrate trunk flexion/extesnion strength of 3+/5 indicatign improve hip stability.   Status On-going   PT SHORT TERM GOAL #3   Title Patient will demonstrate increased hip internal rotation to mobility to 30 degrees bilaterally and External rotation to 40 degrees bilaterally   PT SHORT TERM GOAL #4   Title Patient will dmeosntrate increased hipp abduction ROM of 45 degrees bilaterally to more easily get on and off his motor cycle   PT SHORT TERM GOAL #5   Title Patient will dmeonstrate increased hip abduction and extension strength of 3/5 MMT          PT Long Term Goals - 09/10/14 1240    PT LONG TERM GOAL #1   Title Patient will dmeonstrate increased hip internal rotation of 40 degrees bilaterally and a negative piriformis test to be able to  sit cross legged comfortabley   PT LONG TERM GOAL #2   Title Patient will dmeonstrate increased Hip extension strength of 4+/5 MMT to be able to lift 30+ lb from the floor for work   PT LONG TERM GOAL #3   Title Patient will demostrate increased strunk flexion/extension strength of 4+/5 to be able to maintain correct hip alignement and work on hands and knees withtou pain   PT LONG TERM GOAL #4   Title Patient will be able to ride motor cycle and get on and off it withtou pain.           Plan - 09/10/14 1236    Clinical Impression Statement Patient displays continued Lt sided low back and hip pain secondary to Lt hip stiffness, Lt ankle stiffness, trunk weakness, and low back stiffness as well as hip misalignment with Lt hip now only minorly posteriorly tilted < 1 inches than Rt when assessed at anterior  superior iliac spine. Manual therapy performed to correct hip alignment and with stretches and mobility exercises following to improve LE ROM deficits.    PT Next Visit Plan Focus of theray to be on improving pain correcting Lt hip posterior rotation an improving LE flexibility to normalize gait. Introduce: Planking with frotal plane LE reach        Problem List Patient Active Problem List   Diagnosis Date Noted  . SYNCOPE 01/09/2009  . SMOKER 08/28/2008  . DEEP VENOUS THROMBOPHLEBITIS-RIGHT UPPER EXTREMITY 08/28/2008  . MICROCYTOSIS 12/29/2007  . HYPERLIPIDEMIA 05/03/2007  . DEPRESSION, MAJOR 05/03/2007  . SLEEP APNEA, OBSTRUCTIVE, MILD 01/18/2007  . OBESITY NOS 09/22/2006  . DEPRESSION 09/22/2006  . HYPERTENSION 09/22/2006  . DVT, HX OF 09/22/2006    Devona Konig PT DPT [phone]

## 2014-09-17 ENCOUNTER — Ambulatory Visit (HOSPITAL_COMMUNITY): Payer: BC Managed Care – PPO | Attending: Family Medicine | Admitting: Physical Therapy

## 2014-09-17 DIAGNOSIS — M545 Low back pain: Secondary | ICD-10-CM | POA: Insufficient documentation

## 2014-09-17 DIAGNOSIS — M25652 Stiffness of left hip, not elsewhere classified: Secondary | ICD-10-CM | POA: Insufficient documentation

## 2014-09-17 DIAGNOSIS — M25552 Pain in left hip: Secondary | ICD-10-CM | POA: Insufficient documentation

## 2014-09-17 DIAGNOSIS — Z5189 Encounter for other specified aftercare: Secondary | ICD-10-CM | POA: Insufficient documentation

## 2014-09-19 ENCOUNTER — Ambulatory Visit (HOSPITAL_COMMUNITY): Payer: BC Managed Care – PPO

## 2014-09-23 ENCOUNTER — Ambulatory Visit (HOSPITAL_COMMUNITY): Payer: BC Managed Care – PPO

## 2014-09-27 ENCOUNTER — Ambulatory Visit (HOSPITAL_COMMUNITY)
Admission: RE | Admit: 2014-09-27 | Payer: BC Managed Care – PPO | Source: Ambulatory Visit | Attending: Family Medicine | Admitting: Family Medicine

## 2014-09-27 ENCOUNTER — Encounter (HOSPITAL_COMMUNITY): Payer: Self-pay | Admitting: Physical Therapy

## 2014-09-27 NOTE — Therapy (Signed)
[facility] [address] [place], [place], [address] Phone: [phone]   Fax:  [phone]  Patient Details  Name: [name] MRN: [id number] Date of Birth: [birth year]  Encounter Date: 09/27/2014  PHYSICAL THERAPY DISCHARGE SUMMARY  Visits from Start of Care: 3  Current functional level related to goals / functional outcomes: No goals met   Remaining deficits: PT Short Term Goals - 09/10/14 1239    PT SHORT TERM GOAL #1   Title Patient will be able to side bend through lumbar spine 25 degrees bilaterally to more easily reach into deep pockets Not Met       PT SHORT TERM GOAL #2   Title Patient will dmeosntrate good hip alignment upon arrival to therapy and dmeosntrate trunk flexion/extesnion strength of 3+/5 indicatign improve hip stability. Not Met       PT SHORT TERM GOAL #3   Title Patient will demonstrate increased hip internal rotation to mobility to 30 degrees bilaterally and External rotation to 40 degrees bilaterally Not Met   PT SHORT TERM GOAL #4   Title Patient will dmeosntrate increased hipp abduction ROM of 45 degrees bilaterally to more easily get on and off his motor cycle Not Met   PT SHORT TERM GOAL #5   Title Patient will dmeonstrate increased hip abduction and extension strength of 3/5 MMT Not Met          PT Long Term Goals - 09/10/14 1240    PT LONG TERM GOAL #1   Title Patient will dmeonstrate increased hip internal rotation of 40 degrees bilaterally and a negative piriformis test to be able to sit cross legged comfortably No Met   PT LONG TERM GOAL #2   Title Patient will dmeonstrate increased Hip extension strength of 4+/5 MMT to be able to lift 30+ lb from the floor for work Not Met   PT LONG TERM GOAL #3   Title Patient will demostrate increased strunk flexion/extension strength of 4+/5 to be able to maintain correct hip alignement and work on hands and knees  withtou pain Not Met   PT LONG TERM GOAL #4   Title Patient will be able to ride motor cycle and get on and off it withtou pain.  Not Met       Plan: Patient agrees to discharge.  Patient goals were not met. Patient is being discharged due to not returning since the last visit.  ?????4 consecutive missed appointments

## 2014-10-01 ENCOUNTER — Encounter (HOSPITAL_COMMUNITY): Payer: BC Managed Care – PPO | Admitting: Physical Therapy

## 2014-10-03 ENCOUNTER — Encounter (HOSPITAL_COMMUNITY): Payer: BC Managed Care – PPO

## 2014-10-08 ENCOUNTER — Encounter (HOSPITAL_COMMUNITY): Payer: BC Managed Care – PPO

## 2014-10-10 ENCOUNTER — Encounter (HOSPITAL_COMMUNITY): Payer: BC Managed Care – PPO

## 2014-10-15 ENCOUNTER — Encounter (HOSPITAL_COMMUNITY): Payer: BC Managed Care – PPO

## 2014-10-17 ENCOUNTER — Encounter (HOSPITAL_COMMUNITY): Payer: BC Managed Care – PPO

## 2014-10-22 ENCOUNTER — Encounter (HOSPITAL_COMMUNITY): Payer: BC Managed Care – PPO | Admitting: Physical Therapy

## 2014-10-24 ENCOUNTER — Encounter (HOSPITAL_COMMUNITY): Payer: BC Managed Care – PPO | Admitting: Physical Therapy

## 2015-01-21 ENCOUNTER — Telehealth: Payer: Self-pay | Admitting: Family Medicine

## 2015-01-21 ENCOUNTER — Other Ambulatory Visit: Payer: Self-pay | Admitting: Family Medicine

## 2015-01-21 MED ORDER — HYDROCHLOROTHIAZIDE 25 MG PO TABS: 25.0000 mg | ORAL_TABLET | Freq: Every day | ORAL | Status: DC

## 2015-01-21 MED ORDER — DILTIAZEM HCL ER COATED BEADS 120 MG PO CP24: 120.0000 mg | ORAL_CAPSULE | Freq: Two times a day (BID) | ORAL | Status: DC

## 2015-01-21 MED ORDER — LISINOPRIL 20 MG PO TABS: 20.0000 mg | ORAL_TABLET | Freq: Every day | ORAL | Status: DC

## 2015-01-21 NOTE — Telephone Encounter (Signed)
Last seen 11/15

## 2015-01-21 NOTE — Telephone Encounter (Signed)
Left message on voicemail that meds were sent to pharmacy and will need office visit for further refills.

## 2015-01-21 NOTE — Telephone Encounter (Signed)
Patient is working 12 hours 6 days a week and his Rx for diltiazem (CARDIZEM CD) 120 MG 24 hr capsule, lisinopril (PRINIVIL,ZESTRIL) 20 MG tablet, hydrochlorothiazide (HYDRODIURIL) 25 MG tablet is running out.  He is working these hours for the next 8 weeks and wants to know if we can refill these prescriptions for 2 months.  [facility]

## 2015-01-21 NOTE — Telephone Encounter (Signed)
Ok, must be seen then and evry six months long term

## 2015-02-09 ENCOUNTER — Emergency Department (HOSPITAL_COMMUNITY)
Admission: EM | Admit: 2015-02-09 | Discharge: 2015-02-09 | Disposition: A | Discharge status: Home / Self Care | Payer: BLUE CROSS/BLUE SHIELD | Attending: Emergency Medicine | Admitting: Emergency Medicine

## 2015-02-09 ENCOUNTER — Emergency Department (HOSPITAL_COMMUNITY): Payer: BLUE CROSS/BLUE SHIELD

## 2015-02-09 ENCOUNTER — Encounter (HOSPITAL_COMMUNITY): Payer: Self-pay | Admitting: Emergency Medicine

## 2015-02-09 DIAGNOSIS — R1032 Left lower quadrant pain: Secondary | ICD-10-CM | POA: Insufficient documentation

## 2015-02-09 DIAGNOSIS — Z86718 Personal history of other venous thrombosis and embolism: Secondary | ICD-10-CM | POA: Insufficient documentation

## 2015-02-09 DIAGNOSIS — R112 Nausea with vomiting, unspecified: Secondary | ICD-10-CM | POA: Diagnosis not present

## 2015-02-09 DIAGNOSIS — Z8669 Personal history of other diseases of the nervous system and sense organs: Secondary | ICD-10-CM | POA: Diagnosis not present

## 2015-02-09 DIAGNOSIS — I10 Essential (primary) hypertension: Secondary | ICD-10-CM | POA: Diagnosis not present

## 2015-02-09 DIAGNOSIS — Z79899 Other long term (current) drug therapy: Secondary | ICD-10-CM | POA: Diagnosis not present

## 2015-02-09 DIAGNOSIS — K5900 Constipation, unspecified: Secondary | ICD-10-CM | POA: Insufficient documentation

## 2015-02-09 DIAGNOSIS — G43909 Migraine, unspecified, not intractable, without status migrainosus: Secondary | ICD-10-CM | POA: Diagnosis not present

## 2015-02-09 DIAGNOSIS — R109 Unspecified abdominal pain: Secondary | ICD-10-CM | POA: Diagnosis present

## 2015-02-09 DIAGNOSIS — Z791 Long term (current) use of non-steroidal anti-inflammatories (NSAID): Secondary | ICD-10-CM | POA: Insufficient documentation

## 2015-02-09 LAB — CBC WITH DIFFERENTIAL/PLATELET
Basophils Absolute: 0 10*3/uL (ref 0.0–0.1)
Basophils Relative: 0 % (ref 0–1)
EOS PCT: 0 % (ref 0–5)
Eosinophils Absolute: 0 10*3/uL (ref 0.0–0.7)
HCT: 42.7 % (ref 39.0–52.0)
Hemoglobin: 14.3 g/dL (ref 13.0–17.0)
Lymphocytes Relative: 5 % — ABNORMAL LOW (ref 12–46)
Lymphs Abs: 0.9 10*3/uL (ref 0.7–4.0)
MCH: 23.3 pg — ABNORMAL LOW (ref 26.0–34.0)
MCHC: 33.5 g/dL (ref 30.0–36.0)
MCV: 69.4 fL — AB (ref 78.0–100.0)
MONO ABS: 0.9 10*3/uL (ref 0.1–1.0)
MONOS PCT: 5 % (ref 3–12)
Neutro Abs: 16.5 10*3/uL — ABNORMAL HIGH (ref 1.7–7.7)
Neutrophils Relative %: 90 % — ABNORMAL HIGH (ref 43–77)
PLATELETS: 230 10*3/uL (ref 150–400)
RBC: 6.15 MIL/uL — AB (ref 4.22–5.81)
RDW: 14.7 % (ref 11.5–15.5)
WBC: 18.3 10*3/uL — AB (ref 4.0–10.5)

## 2015-02-09 LAB — URINALYSIS, ROUTINE W REFLEX MICROSCOPIC
Bilirubin Urine: NEGATIVE
Glucose, UA: NEGATIVE mg/dL
HGB URINE DIPSTICK: NEGATIVE
Ketones, ur: NEGATIVE mg/dL
Leukocytes, UA: NEGATIVE
NITRITE: NEGATIVE
PH: 7.5 (ref 5.0–8.0)
Protein, ur: NEGATIVE mg/dL
SPECIFIC GRAVITY, URINE: 1.015 (ref 1.005–1.030)
Urobilinogen, UA: 0.2 mg/dL (ref 0.0–1.0)

## 2015-02-09 LAB — COMPREHENSIVE METABOLIC PANEL
ALK PHOS: 75 U/L (ref 39–117)
ALT: 32 U/L (ref 0–53)
AST: 23 U/L (ref 0–37)
Albumin: 4.4 g/dL (ref 3.5–5.2)
Anion gap: 9 (ref 5–15)
BILIRUBIN TOTAL: 0.4 mg/dL (ref 0.3–1.2)
BUN: 31 mg/dL — ABNORMAL HIGH (ref 6–23)
CHLORIDE: 98 mmol/L (ref 96–112)
CO2: 30 mmol/L (ref 19–32)
Calcium: 9.5 mg/dL (ref 8.4–10.5)
Creatinine, Ser: 1.66 mg/dL — ABNORMAL HIGH (ref 0.50–1.35)
GFR calc Af Amer: 52 mL/min — ABNORMAL LOW (ref >90)
GFR calc non Af Amer: 45 mL/min — ABNORMAL LOW (ref >90)
Glucose, Bld: 153 mg/dL — ABNORMAL HIGH (ref 70–99)
Potassium: 4 mmol/L (ref 3.5–5.1)
SODIUM: 137 mmol/L (ref 135–145)
Total Protein: 7.8 g/dL (ref 6.0–8.3)

## 2015-02-09 LAB — LACTIC ACID, PLASMA: LACTIC ACID, VENOUS: 1.6 mmol/L (ref 0.5–2.0)

## 2015-02-09 MED ORDER — HYDROMORPHONE HCL 1 MG/ML IJ SOLN
1.0000 mg | Freq: Once | INTRAMUSCULAR | Status: AC
  Administered 2015-02-09: 1 mg via INTRAVENOUS
  Filled 2015-02-09: qty 1

## 2015-02-09 MED ORDER — DICYCLOMINE HCL 20 MG PO TABS: 20.0000 mg | ORAL_TABLET | Freq: Three times a day (TID) | ORAL | Status: DC | PRN

## 2015-02-09 MED ORDER — MORPHINE SULFATE 4 MG/ML IJ SOLN
4.0000 mg | Freq: Once | INTRAMUSCULAR | Status: AC
  Administered 2015-02-09: 4 mg via INTRAVENOUS
  Filled 2015-02-09: qty 1

## 2015-02-09 MED ORDER — HYDROCODONE-ACETAMINOPHEN 5-325 MG PO TABS: 1.0000 | ORAL_TABLET | ORAL | Status: DC | PRN

## 2015-02-09 MED ORDER — SENNOSIDES-DOCUSATE SODIUM 8.6-50 MG PO TABS: 2.0000 | ORAL_TABLET | Freq: Every day | ORAL | Status: DC

## 2015-02-09 MED ORDER — IOHEXOL 300 MG/ML  SOLN: 100.0000 mL | Freq: Once | INTRAMUSCULAR | Status: DC | PRN

## 2015-02-09 MED ORDER — IOHEXOL 300 MG/ML  SOLN
50.0000 mL | Freq: Once | INTRAMUSCULAR | Status: AC | PRN
  Administered 2015-02-09: 50 mL via ORAL

## 2015-02-09 MED ORDER — SODIUM CHLORIDE 0.9 % IV BOLUS (SEPSIS)
1000.0000 mL | Freq: Once | INTRAVENOUS | Status: AC
  Administered 2015-02-09: 1000 mL via INTRAVENOUS

## 2015-02-09 MED ORDER — IOHEXOL 300 MG/ML  SOLN
80.0000 mL | Freq: Once | INTRAMUSCULAR | Status: AC | PRN
  Administered 2015-02-09: 80 mL via INTRAVENOUS

## 2015-02-09 MED ORDER — HYOSCYAMINE SULFATE 0.125 MG PO TABS
0.2500 mg | ORAL_TABLET | Freq: Once | ORAL | Status: AC
  Administered 2015-02-09: 0.25 mg via ORAL
  Filled 2015-02-09: qty 2

## 2015-02-09 MED ORDER — DICYCLOMINE HCL 10 MG PO CAPS
20.0000 mg | ORAL_CAPSULE | Freq: Once | ORAL | Status: AC
  Administered 2015-02-09: 20 mg via ORAL
  Filled 2015-02-09: qty 2

## 2015-02-09 MED ORDER — ONDANSETRON HCL 4 MG/2ML IJ SOLN
4.0000 mg | Freq: Once | INTRAMUSCULAR | Status: AC
  Administered 2015-02-09: 4 mg via INTRAMUSCULAR
  Filled 2015-02-09: qty 2

## 2015-02-09 NOTE — ED Notes (Addendum)
Pt reports weakness, abdominal pain,emesis since last night. Pt reports bright red blood noted in toilet this am. Pt denies any dizziness.

## 2015-02-09 NOTE — ED Provider Notes (Signed)
CSN: [id number]     Arrival date & time 02/09/15  0957 History   First MD Initiated Contact with Patient 02/09/15 1001     Chief Complaint  Patient presents with  . Abdominal Pain     (Consider location/radiation/quality/duration/timing/severity/associated sxs/prior Treatment) The history is provided by the patient and the spouse.   [name] is a 54 y.o. male with a medical history including hypertension, migraines and history of DVT in 2008 and is currently being treated with amoxicillin and hydrocodone for a dental infection presenting with severe abdominal pain which started around 3 AM this morning.  He endorses being constipated secondary to that narcotic pain medication and took milk of magnesia laxative prior to bed last night.  He woke around 3 AM this morning with severe abdominal pain, multiple episodes of emesis and has had 3 bowel movements, the first being bloody and character in association with rectal pain and passage of a large hard stool.  He denies continued rectal pain.  The bowel movements have not lessened his abdominal pain which he describes as severe, sharp and waxing and waning in character.  Pain is worse with movement and palpation.  He denies fevers or chills.    Past Medical History  Diagnosis Date  . Hypertension   . Migraine   . Sleep apnea   . H/O blood clots 2008   Past Surgical History  Procedure Laterality Date  . Hernia repair    . Knee surgery     Family History  Problem Relation Age of Onset  . Stroke Mother   . Heart disease Father   . Diabetes Father    History  Substance Use Topics  . Smoking status: Former Research scientist (life sciences)  . Smokeless tobacco: Not on file  . Alcohol Use: No    Review of Systems  Constitutional: Negative for fever and chills.  HENT: Negative.  Negative for congestion and sore throat.   Eyes: Negative.   Respiratory: Negative for chest tightness and shortness of breath.   Cardiovascular: Negative for chest pain.   Gastrointestinal: Positive for nausea, vomiting, abdominal pain, constipation and blood in stool.  Genitourinary: Negative.   Musculoskeletal: Negative for joint swelling, arthralgias and neck pain.  Skin: Negative.  Negative for rash and wound.  Neurological: Negative for dizziness, weakness, light-headedness, numbness and headaches.  Psychiatric/Behavioral: Negative.       Allergies  Toprol xl  Home Medications   Prior to Admission medications   Medication Sig Start Date End Date Taking? Authorizing Provider  acetaminophen (TYLENOL) 500 MG tablet Take 1,000 mg by mouth every 6 (six) hours as needed.   Yes Historical Provider, MD  diltiazem (CARDIZEM CD) 120 MG 24 hr capsule Take 1 capsule (120 mg total) by mouth 2 (two) times daily. 01/21/15  Yes Mikey Kirschner, MD  hydrochlorothiazide (HYDRODIURIL) 25 MG tablet Take 1 tablet (25 mg total) by mouth daily. 01/21/15  Yes Mikey Kirschner, MD  HYDROcodone-acetaminophen [facility]) 5-325 MG per tablet Take 1 tablet every 4-6 hours prn pain 08/19/14  Yes Mikey Kirschner, MD  lisinopril (PRINIVIL,ZESTRIL) 20 MG tablet Take 1 tablet (20 mg total) by mouth daily. 01/21/15  Yes Mikey Kirschner, MD  modafinil (PROVIGIL) 200 MG tablet Take 200 mg by mouth daily as needed (for pain).   Yes Historical Provider, MD  UNKNOWN TO PATIENT Inject as directed every 3 (three) months. INJECTIONS for MIGRAINES administered by Orie Rout, MD at [facility], [place], [place]   Yes  Historical Provider, MD  etodolac (LODINE) 400 MG tablet Take 1 tablet (400 mg total) by mouth 2 (two) times daily. PRN spasm Patient not taking: Reported on 02/09/2015 09/03/14   Mikey Kirschner, MD  ketoconazole (NIZORAL) 2 % cream Apply 1 application topically 2 (two) times daily as needed for irritation. Patient not taking: Reported on 02/09/2015 07/22/14   Mikey Kirschner, MD  tiZANidine (ZANAFLEX) 4 MG capsule Take 1 capsule (4 mg total) by mouth 2 (two) times daily  as needed for muscle spasms. Take with food Patient not taking: Reported on 02/09/2015 08/19/14   Mikey Kirschner, MD   BP 135/92 mmHg  Pulse 87  Temp(Src) 97.9 F (36.6 C) (Oral)  Resp 18  Ht 6' (1.829 m)  Wt 230 lb (104.327 kg)  BMI 31.19 kg/m2  SpO2 92% Physical Exam  Constitutional: He appears well-developed and well-nourished.  HENT:  Head: Normocephalic and atraumatic.  Eyes: Conjunctivae are normal.  Neck: Normal range of motion.  Cardiovascular: Normal rate, regular rhythm, normal heart sounds and intact distal pulses.   Pulmonary/Chest: Effort normal and breath sounds normal. He has no wheezes.  Abdominal: Soft. Bowel sounds are normal. He exhibits no distension and no mass. There is tenderness in the suprapubic area and left lower quadrant. There is no rigidity, no rebound, no guarding and no CVA tenderness.  Musculoskeletal: Normal range of motion.  Neurological: He is alert.  Skin: Skin is warm and dry.  Psychiatric: He has a normal mood and affect.  Nursing note and vitals reviewed.  Hemoccult - not grossly bloody, but hemoccult positive    ED Course  Procedures (including critical care time) Labs Review Labs Reviewed  CBC WITH DIFFERENTIAL/PLATELET - Abnormal; Notable for the following:    WBC 18.3 (*)    RBC 6.15 (*)    MCV 69.4 (*)    MCH 23.3 (*)    Neutrophils Relative % 90 (*)    Lymphocytes Relative 5 (*)    Neutro Abs 16.5 (*)    All other components within normal limits  COMPREHENSIVE METABOLIC PANEL - Abnormal; Notable for the following:    Glucose, Bld 153 (*)    BUN 31 (*)    Creatinine, Ser 1.66 (*)    GFR calc non Af Amer 45 (*)    GFR calc Af Amer 52 (*)    All other components within normal limits  URINALYSIS, ROUTINE W REFLEX MICROSCOPIC  LACTIC ACID, PLASMA  POC OCCULT BLOOD, ED    Imaging Review Ct Abdomen Pelvis W Contrast  02/09/2015   CLINICAL DATA:  Abdominal pain and weakness, vomiting since last night  EXAM: CT ABDOMEN AND  PELVIS WITH CONTRAST  TECHNIQUE: Multidetector CT imaging of the abdomen and pelvis was performed using the standard protocol following bolus administration of intravenous contrast.  CONTRAST:  80mL OMNIPAQUE IOHEXOL 300 MG/ML SOLN, 58mL OMNIPAQUE IOHEXOL 300 MG/ML SOLN  COMPARISON:  08/05/2014  FINDINGS: Lower chest: Curvilinear right greater than left lower lobe scarring reidentified. Intrapulmonary lymph node measuring 3 mm in short axis abutting the major fissure image 2 is incidentally noted. 5 mm left lower lobe pleural parenchymal nodule is stable. 2 mm subpleural right upper lobe pulmonary nodule image 1. These are stable since prior exam 09/20/2012 and compatible with benign nodules by imaging criteria.  Hepatobiliary: Liver and gallbladder are unremarkable.  Pancreas: Normal  Spleen: Normal  Adrenals/Urinary Tract: Adrenal glands are normal. No hydroureteronephrosis. No radiopaque renal, ureteral, or bladder calculus. Bladder is  unremarkable. 1 cm right lower pole renal cortical cyst.  Stomach/Bowel: No bowel wall thickening or focal segmental dilatation is identified. Appendix not visualized but no secondary evidence for acute appendicitis is identified. Stomach appears normal.  Vascular/Lymphatic: No lymphadenopathy. Mild atheromatous aortic calcification without aneurysm.  Reproductive: Mild prostatomegaly.  Other: No free air or fluid.  Musculoskeletal: No acute osseous abnormality.  IMPRESSION: No acute intra-abdominal or pelvic pathology. No specific etiology for the reported history of vomiting.   Electronically Signed   By: Conchita Paris M.D.   On: 02/09/2015 15:09   Dg Abd Acute W/chest  02/09/2015   CLINICAL DATA:  Abdominal pain, vomiting and weakness since last night. Bright red blood per rectum.  EXAM: DG ABDOMEN ACUTE W/ 1V CHEST  COMPARISON:  Previous examinations.  FINDINGS: Normal sized heart. Clear lungs. Mildly elevated right hemidiaphragm. Poor inspiration with minimal bibasilar  atelectasis. Multiple air-fluid levels in normal caliber bowel loops. No free peritoneal air. Mild lumbar spine scoliosis and degenerative changes.  IMPRESSION: Multiple air-fluid levels in normal caliber bowel loops. This can be seen with gastroenteritis.   Electronically Signed   By: Claudie Revering M.D.   On: 02/09/2015 11:59     EKG Interpretation None     Results for orders placed or performed during the hospital encounter of 02/09/15  CBC with Differential  Result Value Ref Range   WBC 18.3 (H) 4.0 - 10.5 K/uL   RBC 6.15 (H) 4.22 - 5.81 MIL/uL   Hemoglobin 14.3 13.0 - 17.0 g/dL   HCT 42.7 39.0 - 52.0 %   MCV 69.4 (L) 78.0 - 100.0 fL   MCH 23.3 (L) 26.0 - 34.0 pg   MCHC 33.5 30.0 - 36.0 g/dL   RDW 14.7 11.5 - 15.5 %   Platelets 230 150 - 400 K/uL   Neutrophils Relative % 90 (H) 43 - 77 %   Lymphocytes Relative 5 (L) 12 - 46 %   Monocytes Relative 5 3 - 12 %   Eosinophils Relative 0 0 - 5 %   Basophils Relative 0 0 - 1 %   Neutro Abs 16.5 (H) 1.7 - 7.7 K/uL   Lymphs Abs 0.9 0.7 - 4.0 K/uL   Monocytes Absolute 0.9 0.1 - 1.0 K/uL   Eosinophils Absolute 0.0 0.0 - 0.7 K/uL   Basophils Absolute 0.0 0.0 - 0.1 K/uL   RBC Morphology TEARDROP CELLS   Comprehensive metabolic panel  Result Value Ref Range   Sodium 137 135 - 145 mmol/L   Potassium 4.0 3.5 - 5.1 mmol/L   Chloride 98 96 - 112 mmol/L   CO2 30 19 - 32 mmol/L   Glucose, Bld 153 (H) 70 - 99 mg/dL   BUN 31 (H) 6 - 23 mg/dL   Creatinine, Ser 1.66 (H) 0.50 - 1.35 mg/dL   Calcium 9.5 8.4 - 10.5 mg/dL   Total Protein 7.8 6.0 - 8.3 g/dL   Albumin 4.4 3.5 - 5.2 g/dL   AST 23 0 - 37 U/L   ALT 32 0 - 53 U/L   Alkaline Phosphatase 75 39 - 117 U/L   Total Bilirubin 0.4 0.3 - 1.2 mg/dL   GFR calc non Af Amer 45 (L) >90 mL/min   GFR calc Af Amer 52 (L) >90 mL/min   Anion gap 9 5 - 15  Urinalysis, Routine w reflex microscopic  Result Value Ref Range   Color, Urine YELLOW YELLOW   APPearance CLEAR CLEAR   Specific Gravity, Urine  1.015  1.005 - 1.030   pH 7.5 5.0 - 8.0   Glucose, UA NEGATIVE NEGATIVE mg/dL   Hgb urine dipstick NEGATIVE NEGATIVE   Bilirubin Urine NEGATIVE NEGATIVE   Ketones, ur NEGATIVE NEGATIVE mg/dL   Protein, ur NEGATIVE NEGATIVE mg/dL   Urobilinogen, UA 0.2 0.0 - 1.0 mg/dL   Nitrite NEGATIVE NEGATIVE   Leukocytes, UA NEGATIVE NEGATIVE  Lactic acid, plasma  Result Value Ref Range   Lactic Acid, Venous 1.6 0.5 - 2.0 mmol/L    MDM   Final diagnoses:  Abdominal pain    Patients labs and/or radiological studies were reviewed and considered during the medical decision making and disposition process.  Results were also discussed with patient.  Pt was advised to use stool softener, not laxative which probably contributed to his pain today.  He was prescrbied  Senokot s along with a few hydrocodone - advised to use sparingly to avoid further constipation.  Pt was seen by Dr. Sabra Heck prior to dc home.  Pt exam recheck, no acute abdomen, soft, no guarding.  Labs, ct reviewed.  Advised f/u with pcp or return here for any worsened or persistent sx.     Evalee Jefferson, PA-C 02/09/15 1536  Noemi Chapel, MD 02/09/15 1540

## 2015-02-09 NOTE — ED Notes (Signed)
Pt verbalized understanding of no driving and to use caution within 4 hours of taking pain meds due to meds cause drowsiness 

## 2015-02-09 NOTE — Discharge Instructions (Signed)
Abdominal Pain Many things can cause abdominal pain. Usually, abdominal pain is not caused by a disease and will improve without treatment. It can often be observed and treated at home. Your health care provider will do a physical exam and possibly order blood tests and X-rays to help determine the seriousness of your pain. However, in many cases, more time must pass before a clear cause of the pain can be found. Before that point, your health care provider may not know if you need more testing or further treatment. HOME CARE INSTRUCTIONS  Monitor your abdominal pain for any changes. The following actions may help to alleviate any discomfort you are experiencing:  Only take over-the-counter or prescription medicines as directed by your health care provider.  Do not take laxatives unless directed to do so by your health care provider.  Try a clear liquid diet (broth, tea, or water) as directed by your health care provider. Slowly move to a bland diet as tolerated. SEEK MEDICAL CARE IF:  You have unexplained abdominal pain.  You have abdominal pain associated with nausea or diarrhea.  You have pain when you urinate or have a bowel movement.  You experience abdominal pain that wakes you in the night.  You have abdominal pain that is worsened or improved by eating food.  You have abdominal pain that is worsened with eating fatty foods.  You have a fever. SEEK IMMEDIATE MEDICAL CARE IF:   Your pain does not go away within 2 hours.  You keep throwing up (vomiting).  Your pain is felt only in portions of the abdomen, such as the right side or the left lower portion of the abdomen.  You pass bloody or black tarry stools. MAKE SURE YOU:  Understand these instructions.   Will watch your condition.   Will get help right away if you are not doing well or get worse.  Document Released: 07/14/2005 Document Revised: 10/09/2013 Document Reviewed: 06/13/2013 [facility] Patient Information  2015 [place], [place]. This information is not intended to replace advice given to you by your health care provider. Make sure you discuss any questions you have with your health care provider.   You may use the hydrocodone prescribed sparingly if needed for persistent pain.  Use the stool softener prescribed to help prevent further constipation, which appears to be resolved based on your CT today.  Do not take a laxative (Milk of Magnesia, dulcolax, etc) as this medicine can worsen pain and cramping.

## 2015-02-09 NOTE — ED Provider Notes (Signed)
The patient is a 54 year old male who presents after developing severe abdominal pain overnight. This abdominal pain was associated with nausea and vomiting, he has recently been constipated secondary to opiate use, he took milk of magnesia and stool softeners last night  And had significant cramping that followed. On exam the patient has tenderness in the left mid abdomen, left lower quadrant with mild guarding but no distention, no increased bowel sounds, no peritoneal signs. His labs suggest a significant leukocytosis, plain film imaging did not reveal any specific etiology, CT scan pending to further elucidate the source of the patient's significant abdominal pain.  Medical screening examination/treatment/procedure(s) were conducted as a shared visit with non-physician practitioner(s) and myself.  I personally evaluated the patient during the encounter.  Clinical Impression:   Final diagnoses:  Abdominal pain         Noemi Chapel, MD 02/09/15 1540

## 2015-02-12 ENCOUNTER — Ambulatory Visit (INDEPENDENT_AMBULATORY_CARE_PROVIDER_SITE_OTHER): Payer: BLUE CROSS/BLUE SHIELD | Admitting: Family Medicine

## 2015-02-12 ENCOUNTER — Encounter: Payer: Self-pay | Admitting: Family Medicine

## 2015-02-12 VITALS — BP 140/92 | Ht 72.0 in | Wt 226.2 lb

## 2015-02-12 DIAGNOSIS — K921 Melena: Secondary | ICD-10-CM

## 2015-02-12 NOTE — Progress Notes (Signed)
   Subjective:    Patient ID: [name], male    DOB: [birth year], 54 y.o.   MRN: [id number]  HPI Patient arrives for a follow up on Er visit for abd pain. Patient states he is feeling better.  Went to ER   Had dental infxn, took pain meda and took abx  Had constip issues with the narcotoics  Took MOM took it  Developed abd paion and cramping severe impressive pain  Blood in the stool  Had full w u in the ER, scan was normal ,   WBC elevated  Never has had the colonocscopy   day num [phone]  Review of Systems No headache no back pain no change in bowel habits no blood in stools at this time    Objective:   Physical Exam  Alert no acute distress vital stable lungs clear. Heart regular in rhythm abdomen good bowel sounds no discrete tenderness no rebound no guarding      Assessment & Plan:  Impression transient constipation which led to episode of hematochezia. No further blood in stools. On further history as never had a colonoscopy plan referral for colonoscopy. Warning signs discussed. WSL

## 2015-02-19 ENCOUNTER — Encounter: Payer: Self-pay | Admitting: Internal Medicine

## 2015-02-20 ENCOUNTER — Ambulatory Visit (INDEPENDENT_AMBULATORY_CARE_PROVIDER_SITE_OTHER): Payer: BLUE CROSS/BLUE SHIELD | Admitting: Family Medicine

## 2015-02-20 ENCOUNTER — Encounter: Payer: Self-pay | Admitting: Family Medicine

## 2015-02-20 VITALS — BP 136/86 | Ht 72.0 in | Wt 233.0 lb

## 2015-02-20 DIAGNOSIS — I10 Essential (primary) hypertension: Secondary | ICD-10-CM

## 2015-02-20 MED ORDER — HYDROCHLOROTHIAZIDE 25 MG PO TABS: 25.0000 mg | ORAL_TABLET | Freq: Every day | ORAL | Status: DC

## 2015-02-20 MED ORDER — DILTIAZEM HCL ER COATED BEADS 120 MG PO CP24: 120.0000 mg | ORAL_CAPSULE | Freq: Two times a day (BID) | ORAL | Status: DC

## 2015-02-20 MED ORDER — LISINOPRIL 20 MG PO TABS: 20.0000 mg | ORAL_TABLET | Freq: Two times a day (BID) | ORAL | Status: DC

## 2015-02-20 MED ORDER — KETOCONAZOLE 2 % EX CREA: 1.0000 "application " | TOPICAL_CREAM | Freq: Two times a day (BID) | CUTANEOUS | Status: DC | PRN

## 2015-02-20 NOTE — Progress Notes (Signed)
   Subjective:    Patient ID: [name], male    DOB: [birth year], 54 y.o.   MRN: [id number]  Hypertension This is a chronic problem. The current episode started more than 1 year ago.   Pt states no concerns today. Needs refills on all meds including the ketoconazole cream.   BP numbers fair ovrall 140 over 96 or sometimes [id number] for the diastolic   Working 12 hour shift six d per wk  Working on exercise an diet these    Review of Systems No vomiting no diarrhea no chest pain. Recent blood in stool GI referral in progress    Objective:   Physical Exam  Alert vitals stable blood pressure decent 134/85. HEENT normal. Lungs clear. Heart regular in rhythm. Next  Review of home blood pressure show consistently elevated blood pressure      Assessment & Plan:  Impression hypertension suboptimal in discussed plan double lisinopril. Maintain other medications. Diet exercise discussed recheck in 6 months WSL

## 2015-03-11 ENCOUNTER — Ambulatory Visit: Payer: BLUE CROSS/BLUE SHIELD | Admitting: Nurse Practitioner

## 2015-08-26 ENCOUNTER — Encounter: Payer: Self-pay | Admitting: Family Medicine

## 2015-08-26 ENCOUNTER — Ambulatory Visit (HOSPITAL_COMMUNITY)
Admission: RE | Admit: 2015-08-26 | Discharge: 2015-08-26 | Disposition: A | Discharge status: Home / Self Care | Payer: BLUE CROSS/BLUE SHIELD | Source: Ambulatory Visit | Attending: Family Medicine | Admitting: Family Medicine

## 2015-08-26 ENCOUNTER — Ambulatory Visit (INDEPENDENT_AMBULATORY_CARE_PROVIDER_SITE_OTHER): Payer: BLUE CROSS/BLUE SHIELD | Admitting: Family Medicine

## 2015-08-26 VITALS — BP 144/88 | Ht 72.0 in | Wt 214.1 lb

## 2015-08-26 DIAGNOSIS — M25552 Pain in left hip: Secondary | ICD-10-CM | POA: Diagnosis not present

## 2015-08-26 DIAGNOSIS — M25511 Pain in right shoulder: Secondary | ICD-10-CM

## 2015-08-26 DIAGNOSIS — Z125 Encounter for screening for malignant neoplasm of prostate: Secondary | ICD-10-CM

## 2015-08-26 DIAGNOSIS — E785 Hyperlipidemia, unspecified: Secondary | ICD-10-CM

## 2015-08-26 DIAGNOSIS — Z79899 Other long term (current) drug therapy: Secondary | ICD-10-CM | POA: Diagnosis not present

## 2015-08-26 DIAGNOSIS — I10 Essential (primary) hypertension: Secondary | ICD-10-CM

## 2015-08-26 MED ORDER — ETODOLAC 400 MG PO TABS: 400.0000 mg | ORAL_TABLET | Freq: Two times a day (BID) | ORAL | Status: DC

## 2015-08-26 MED ORDER — KETOCONAZOLE 2 % EX CREA: 1.0000 "application " | TOPICAL_CREAM | Freq: Two times a day (BID) | CUTANEOUS | Status: DC | PRN

## 2015-08-26 MED ORDER — LISINOPRIL 20 MG PO TABS: 20.0000 mg | ORAL_TABLET | Freq: Two times a day (BID) | ORAL | Status: DC

## 2015-08-26 MED ORDER — DILTIAZEM HCL ER COATED BEADS 120 MG PO CP24: 120.0000 mg | ORAL_CAPSULE | Freq: Two times a day (BID) | ORAL | Status: DC

## 2015-08-26 MED ORDER — HYDROCHLOROTHIAZIDE 25 MG PO TABS: 25.0000 mg | ORAL_TABLET | Freq: Every day | ORAL | Status: DC

## 2015-08-26 NOTE — Progress Notes (Signed)
   Subjective:    Patient ID: [name], male    DOB: August 05, 1961, 54 y.o.   MRN: [id number] Patient arrives office with 3 distinct concerns.    Hypertension This is a chronic problem. The current episode started more than 1 year ago. There are no compliance problems.   Shoulder Pain  This is a new problem. The current episode started more than 1 month ago. There has been no history of extremity trauma. The problem occurs constantly.  Hip Pain  The incident occurred more than 1 week ago. There was no injury mechanism. The pain is present in the left hip. Exacerbated by: Sitting.   claims compliance with blood pressure medicine. Watching salt intake. Medications reviewed today. States does not miss a dose. No obvious side effects.  Shoulder pain is been gone for several months. Recalls no acute injury. No remote injury. Worse with certain motions is tried Advil helped some sharp at times achy other times migrates towards the back.  Hip pain ongoing sometimes deep and hip sometimes lateral part of the mostly sharp at times headache somewhat responsive to ibuprofen worse after working.  Patient in today with c/o of right shoulder, and left hip pain.pt doing exercises for hip. Patient states no other concerns this visit.   Review of Systems No headache no chest pain no back pain no abdominal pain no change in bowel habits ROS otherwise negative    Objective:   Physical Exam   Alert vitals stable blood pressure good on repeat HEENT normal right shoulder positive impingement sign negative deltoid tenderness no difficulty with strength deep tendon reflex raise and sensation intact lungs clear heart rhythm left hip tender with internal rotation     Assessment & Plan:  Pressure 1 hypertension good control discussed maintain same meds #2 right shoulder probable shoulder impingement question rotator cuff involvement discussed medications discussed #3 left hip pain potentially arthritic.  Plan x-ray shoulder x-ray hip anti-inflammatory when necessary. Codman's exercises appropriate blood work maintain same medications diet exercise discussed 25 minutes spent most in discussion WSL

## 2015-08-27 LAB — BASIC METABOLIC PANEL
BUN/Creatinine Ratio: 12 (ref 9–20)
BUN: 18 mg/dL (ref 6–24)
CALCIUM: 10.1 mg/dL (ref 8.7–10.2)
CO2: 29 mmol/L (ref 18–29)
CREATININE: 1.52 mg/dL — AB (ref 0.76–1.27)
Chloride: 98 mmol/L (ref 97–106)
GFR, EST AFRICAN AMERICAN: 59 mL/min/{1.73_m2} — AB (ref >59)
GFR, EST NON AFRICAN AMERICAN: 51 mL/min/{1.73_m2} — AB (ref >59)
Glucose: 68 mg/dL (ref 65–99)
Potassium: 4.5 mmol/L (ref 3.5–5.2)
Sodium: 142 mmol/L (ref 136–144)

## 2015-08-27 LAB — LIPID PANEL
CHOL/HDL RATIO: 3.6 ratio (ref 0.0–5.0)
Cholesterol, Total: 167 mg/dL (ref 100–199)
HDL: 47 mg/dL (ref >39)
LDL Calculated: 108 mg/dL — ABNORMAL HIGH (ref 0–99)
Triglycerides: 59 mg/dL (ref 0–149)
VLDL Cholesterol Cal: 12 mg/dL (ref 5–40)

## 2015-08-27 LAB — HEPATIC FUNCTION PANEL
ALBUMIN: 4.8 g/dL (ref 3.5–5.5)
ALT: 16 IU/L (ref 0–44)
AST: 15 IU/L (ref 0–40)
Alkaline Phosphatase: 69 IU/L (ref 39–117)
Bilirubin, Direct: 0.07 mg/dL (ref 0.00–0.40)
TOTAL PROTEIN: 7.2 g/dL (ref 6.0–8.5)

## 2015-08-27 LAB — PSA: Prostate Specific Ag, Serum: 8 ng/mL — ABNORMAL HIGH (ref 0.0–4.0)

## 2015-09-03 ENCOUNTER — Encounter: Payer: Self-pay | Admitting: Family Medicine

## 2015-09-03 ENCOUNTER — Ambulatory Visit (INDEPENDENT_AMBULATORY_CARE_PROVIDER_SITE_OTHER): Payer: BLUE CROSS/BLUE SHIELD | Admitting: Family Medicine

## 2015-09-03 VITALS — BP 142/88 | Ht 72.0 in | Wt 214.0 lb

## 2015-09-03 DIAGNOSIS — R972 Elevated prostate specific antigen [PSA]: Secondary | ICD-10-CM

## 2015-09-03 LAB — POCT URINALYSIS DIPSTICK
PH UA: 6
Spec Grav, UA: 1.015

## 2015-09-03 MED ORDER — CIPROFLOXACIN HCL 500 MG PO TABS: 500.0000 mg | ORAL_TABLET | Freq: Two times a day (BID) | ORAL | Status: DC

## 2015-09-03 NOTE — Progress Notes (Signed)
Subjective:    Patient ID: [name], male    DOB: 15-Oct-1961, 54 y.o.   MRN: [id number]  HPI Patient arrives to discuss abnormal PSA results.  Results for orders placed or performed in visit on 08/26/15  Lipid panel  Result Value Ref Range   Cholesterol, Total 167 100 - 199 mg/dL   Triglycerides 59 0 - 149 mg/dL   HDL 47 >39 mg/dL   VLDL Cholesterol Cal 12 5 - 40 mg/dL   LDL Calculated 108 (H) 0 - 99 mg/dL   Chol/HDL Ratio 3.6 0.0 - 5.0 ratio units  Hepatic function panel  Result Value Ref Range   Total Protein 7.2 6.0 - 8.5 g/dL   Albumin 4.8 3.5 - 5.5 g/dL   Bilirubin Total <0.2 0.0 - 1.2 mg/dL   Bilirubin, Direct 0.07 0.00 - 0.40 mg/dL   Alkaline Phosphatase 69 39 - 117 IU/L   AST 15 0 - 40 IU/L   ALT 16 0 - 44 IU/L  PSA  Result Value Ref Range   Prostate Specific Ag, Serum 8.0 (H) 0.0 - 4.0 ng/mL  Basic metabolic panel  Result Value Ref Range   Glucose 68 65 - 99 mg/dL   BUN 18 6 - 24 mg/dL   Creatinine, Ser 1.52 (H) 0.76 - 1.27 mg/dL   GFR calc non Af Amer 51 (L) >59 mL/min/1.73   GFR calc Af Amer 59 (L) >59 mL/min/1.73   BUN/Creatinine Ratio 12 9 - 20   Sodium 142 136 - 144 mmol/L   Potassium 4.5 3.5 - 5.2 mmol/L   Chloride 98 97 - 106 mmol/L   CO2 29 18 - 29 mmol/L   Calcium 10.1 8.7 - 10.2 mg/dL   Question of kidney stone in the past  The patient notes no history of elevated PSA. Some slight increase urination lately. No obvious dysuria. No abdominal pain no hematuria.   No close family history of prostate cancer  Day 36 [phone]  Review of Systems No abdominal pain no back pain no hip pain no rash no dysuria ROS otherwise negative    Objective:   Physical Exam  Alert vital stable lungs clear heart regular in rhythm no CVA tenderness abdomen benign prostate gland somewhat boggy no obvious tenderness. Next  Urinalysis within normal limits      Assessment & Plan:                    Impression elevated PSA with potential element of  prostatitis PSAs were ordered last year patient did not get the blood work at that time. Initiate Cipro twice a day 21 days. Urology referral rationale discussed 25 minutes spent most in discussion WSL

## 2015-09-16 ENCOUNTER — Ambulatory Visit: Payer: BLUE CROSS/BLUE SHIELD | Admitting: Urology

## 2015-10-28 ENCOUNTER — Other Ambulatory Visit: Payer: Self-pay | Admitting: Urology

## 2015-10-28 ENCOUNTER — Ambulatory Visit (INDEPENDENT_AMBULATORY_CARE_PROVIDER_SITE_OTHER): Payer: BLUE CROSS/BLUE SHIELD | Admitting: Urology

## 2015-10-28 DIAGNOSIS — R972 Elevated prostate specific antigen [PSA]: Secondary | ICD-10-CM | POA: Diagnosis not present

## 2015-11-21 ENCOUNTER — Other Ambulatory Visit: Payer: Self-pay | Admitting: Urology

## 2015-11-21 DIAGNOSIS — R972 Elevated prostate specific antigen [PSA]: Secondary | ICD-10-CM

## 2015-11-25 ENCOUNTER — Ambulatory Visit (HOSPITAL_COMMUNITY)
Admission: RE | Admit: 2015-11-25 | Discharge: 2015-11-25 | Disposition: A | Discharge status: Home / Self Care | Payer: BLUE CROSS/BLUE SHIELD | Source: Ambulatory Visit | Attending: Urology | Admitting: Urology

## 2015-11-25 VITALS — BP 141/97 | HR 70 | Temp 98.4°F | Resp 20

## 2015-11-25 DIAGNOSIS — N400 Enlarged prostate without lower urinary tract symptoms: Secondary | ICD-10-CM | POA: Insufficient documentation

## 2015-11-25 DIAGNOSIS — R972 Elevated prostate specific antigen [PSA]: Secondary | ICD-10-CM | POA: Insufficient documentation

## 2015-11-25 MED ORDER — GENTAMICIN SULFATE 40 MG/ML IJ SOLN
160.0000 mg | Freq: Once | INTRAMUSCULAR | Status: AC
  Administered 2015-11-25: 160 mg via INTRAMUSCULAR

## 2015-11-25 MED ORDER — LIDOCAINE HCL (PF) 2 % IJ SOLN
INTRAMUSCULAR | Status: AC
  Administered 2015-11-25: 10 mL
  Filled 2015-11-25: qty 10

## 2015-11-25 MED ORDER — LIDOCAINE HCL (PF) 2 % IJ SOLN
10.0000 mL | Freq: Once | INTRAMUSCULAR | Status: AC
  Administered 2015-11-25: 10 mL

## 2015-11-25 MED ORDER — GENTAMICIN SULFATE 40 MG/ML IJ SOLN
INTRAMUSCULAR | Status: AC
  Administered 2015-11-25: 160 mg via INTRAMUSCULAR
  Filled 2015-11-25: qty 4

## 2015-11-25 NOTE — Discharge Instructions (Signed)
Transrectal Ultrasound-Guided Biopsy °A transrectal ultrasound-guided biopsy is a procedure to take samples of tissue from your prostate. Ultrasound images are used to guide the procedure. It is usually done to check the prostate gland for cancer. °BEFORE THE PROCEDURE °· Do not eat or drink after midnight on the night before your procedure. °· Take medicines as your doctor tells you. °· Your doctor may have you stop taking some medicines 5-7 days before the procedure. °· You will be given an enema before your procedure. During an enema, a liquid is put into your butt (rectum) to clear out waste. °· You may have lab tests the day of your procedure. °· Make plans to have someone drive you home. °PROCEDURE °· You will be given medicine to help you relax before the procedure. An IV tube will be put into one of your veins. It will be used to give fluids and medicine. °· You will be given medicine to reduce the risk of infection (antibiotic). °· You will be placed on your side. °· A probe with gel will be put in your butt. This is used to take pictures of your prostate and the area around it. °· A medicine to numb the area is put into your prostate. °· A biopsy needle is then inserted and guided to your prostate. °· Samples of prostate tissue are taken. The needle is removed. °· The samples are sent to a lab to be checked. Results are usually back in 2-3 days. °AFTER THE PROCEDURE °· You will be taken to a room where you will be watched until you are doing okay. °· You may have some pain in the area around your butt. You will be given medicines for this. °· You may be able to go home the same day. Sometimes, an overnight stay in the hospital is needed. °  °This information is not intended to replace advice given to you by your health care provider. Make sure you discuss any questions you have with your health care provider. °  °Document Released: 09/22/2009 Document Revised: 10/09/2013 Document Reviewed:  05/23/2013 °Elsevier Interactive Patient Education ©2016 [employer] ° °

## 2016-02-18 ENCOUNTER — Encounter: Payer: BLUE CROSS/BLUE SHIELD | Admitting: Family Medicine

## 2016-03-06 ENCOUNTER — Other Ambulatory Visit: Payer: Self-pay | Admitting: Family Medicine

## 2016-04-05 ENCOUNTER — Encounter: Payer: Self-pay | Admitting: Family Medicine

## 2016-04-05 ENCOUNTER — Ambulatory Visit (INDEPENDENT_AMBULATORY_CARE_PROVIDER_SITE_OTHER): Payer: BLUE CROSS/BLUE SHIELD | Admitting: Family Medicine

## 2016-04-05 VITALS — BP 150/108 | Ht 72.0 in | Wt 216.0 lb

## 2016-04-05 DIAGNOSIS — I10 Essential (primary) hypertension: Secondary | ICD-10-CM | POA: Diagnosis not present

## 2016-04-05 MED ORDER — HYDROCHLOROTHIAZIDE 25 MG PO TABS: 25.0000 mg | ORAL_TABLET | Freq: Every day | ORAL | Status: DC

## 2016-04-05 MED ORDER — DILTIAZEM HCL ER COATED BEADS 120 MG PO CP24: 120.0000 mg | ORAL_CAPSULE | Freq: Two times a day (BID) | ORAL | Status: DC

## 2016-04-05 MED ORDER — LOSARTAN POTASSIUM 100 MG PO TABS: 100.0000 mg | ORAL_TABLET | Freq: Every day | ORAL | Status: DC

## 2016-04-05 NOTE — Progress Notes (Signed)
   Subjective:    Patient ID: [name], male    DOB: [birth year], 55 y.o.   MRN: [id number]  Hypertension This is a chronic problem. The current episode started more than 1 year ago. Associated symptoms include headaches.   Patient states no other concerns this visit.  Ran out of lisinopril for two weeks   Still taking cardizem and hctz, fairm amnt of ex at work, Claims compliance with medications, states even when he was on lisinopril his blood pressure was not the best control  Patient continues to see both a headache specialist in the prostate specialist.  Review of Systems  Neurological: Positive for headaches.  No chest pain no back pain no abdominal pain no change in bowel habits ROS otherwise     Objective:   Physical Exam  Alert vitals stable. HEENT normal. Neck supple lungs clear. Heart regular rate and rhythm.      Assessment & Plan:  Impression hypertension suboptimal discussed plan stop lisinopril. Start losartan rationale discussed. Maintain other medications. Diet exercise discussed recheck in 6 months WSL

## 2016-08-07 ENCOUNTER — Other Ambulatory Visit: Payer: Self-pay | Admitting: Family Medicine

## 2016-10-08 ENCOUNTER — Ambulatory Visit: Payer: BLUE CROSS/BLUE SHIELD | Admitting: Family Medicine

## 2016-11-05 ENCOUNTER — Encounter: Payer: Self-pay | Admitting: Family Medicine

## 2016-11-05 ENCOUNTER — Ambulatory Visit (INDEPENDENT_AMBULATORY_CARE_PROVIDER_SITE_OTHER): Payer: BLUE CROSS/BLUE SHIELD | Admitting: Family Medicine

## 2016-11-05 VITALS — BP 142/88 | Ht 72.0 in | Wt 225.0 lb

## 2016-11-05 DIAGNOSIS — E785 Hyperlipidemia, unspecified: Secondary | ICD-10-CM | POA: Diagnosis not present

## 2016-11-05 DIAGNOSIS — Z79899 Other long term (current) drug therapy: Secondary | ICD-10-CM

## 2016-11-05 DIAGNOSIS — I10 Essential (primary) hypertension: Secondary | ICD-10-CM | POA: Diagnosis not present

## 2016-11-05 DIAGNOSIS — Z125 Encounter for screening for malignant neoplasm of prostate: Secondary | ICD-10-CM

## 2016-11-05 DIAGNOSIS — R972 Elevated prostate specific antigen [PSA]: Secondary | ICD-10-CM

## 2016-11-05 MED ORDER — LOSARTAN POTASSIUM 100 MG PO TABS: 100.0000 mg | ORAL_TABLET | Freq: Every day | ORAL | 5 refills | Status: DC

## 2016-11-05 MED ORDER — DILTIAZEM HCL ER COATED BEADS 120 MG PO CP24: 120.0000 mg | ORAL_CAPSULE | Freq: Two times a day (BID) | ORAL | 5 refills | Status: DC

## 2016-11-05 MED ORDER — ALPRAZOLAM 1 MG PO TABS: ORAL_TABLET | ORAL | 5 refills | Status: DC

## 2016-11-05 NOTE — Progress Notes (Signed)
   Subjective:    Patient ID: [name], male    DOB: 1960/11/07, 56 y.o.   MRN: [id number]  Hypertension  This is a chronic problem. The current episode started more than 1 year ago. Treatments tried: losartan, cartia pt states he has not taken the HCTZ because it was not sent in for him. Compliance problems include exercise and diet.    Having trouble sleeping.seperated from wife six mo ago. Not communicating with wife curently.  Pt took xanax in the past and it seemed to help . Does not feel like he needs antidepressant. states that the negative side effects sometimes associated with Xanax did not occur. No suicidal thoughts  Review showed substantial elevation PSA with subsequent biopsy negative patient was encouraged to follow-up, in his words "sometimes this yr"  Still havin headahces, gets trige point injedtion  Blood pressure medicine and blood pressure levels reviewed today with patient. Compliant with blood pressure medicine. States does not miss a dose. No obvious side effects. Blood pressure generally good when checked elsewhere. Watching salt intake.   exercuis e t so much right now,  But def exercise at work     Started 6 months or more.   Review of Systems Alert vitals stable, NAD. Blood pressure good on repeat. HEENT normal. Lungs clear. Heart regular rate and rhythm. No headache, no major weight loss or weight gain, no chest pain no back pain abdominal pain no change in bowel habits complete ROS otherwise negative     Objective:   Physical Exam Alert vitals stable, NAD. Blood pressure good on repeat. HEENT normal. Lungs clear. Heart regular rate and rhythm.        Assessment & Plan:  Impression 1 hypertension relatively good control discussed. Patient came off the diuretic. Would prefer to stay off of it. Will hold off for now. #2 insomnia very substantial discussed will initiate Xanax No. 3 situational grief patient desires no medication discussed #4  history of elevated PSA. Importance of follow-up discussed plan medications renewed and initiated. Diet exercise discussed. Appropriate blood work further recommendations based results. Recheck in 6 months. WSL

## 2016-12-28 DIAGNOSIS — G4730 Sleep apnea, unspecified: Secondary | ICD-10-CM | POA: Diagnosis not present

## 2017-01-28 DIAGNOSIS — G4730 Sleep apnea, unspecified: Secondary | ICD-10-CM | POA: Diagnosis not present

## 2017-02-04 DIAGNOSIS — M791 Myalgia: Secondary | ICD-10-CM | POA: Diagnosis not present

## 2017-02-04 DIAGNOSIS — R51 Headache: Secondary | ICD-10-CM | POA: Diagnosis not present

## 2017-02-04 DIAGNOSIS — G43719 Chronic migraine without aura, intractable, without status migrainosus: Secondary | ICD-10-CM | POA: Diagnosis not present

## 2017-02-04 DIAGNOSIS — G518 Other disorders of facial nerve: Secondary | ICD-10-CM | POA: Diagnosis not present

## 2017-02-04 DIAGNOSIS — M542 Cervicalgia: Secondary | ICD-10-CM | POA: Diagnosis not present

## 2017-02-27 DIAGNOSIS — G4730 Sleep apnea, unspecified: Secondary | ICD-10-CM | POA: Diagnosis not present

## 2017-03-30 DIAGNOSIS — G4730 Sleep apnea, unspecified: Secondary | ICD-10-CM | POA: Diagnosis not present

## 2017-04-29 DIAGNOSIS — G4730 Sleep apnea, unspecified: Secondary | ICD-10-CM | POA: Diagnosis not present

## 2017-05-05 ENCOUNTER — Ambulatory Visit: Payer: BLUE CROSS/BLUE SHIELD | Admitting: Family Medicine

## 2017-05-13 ENCOUNTER — Encounter: Payer: Self-pay | Admitting: Family Medicine

## 2017-05-30 DIAGNOSIS — G4730 Sleep apnea, unspecified: Secondary | ICD-10-CM | POA: Diagnosis not present

## 2017-05-31 ENCOUNTER — Encounter: Payer: Self-pay | Admitting: Family Medicine

## 2017-05-31 ENCOUNTER — Ambulatory Visit (INDEPENDENT_AMBULATORY_CARE_PROVIDER_SITE_OTHER): Payer: BLUE CROSS/BLUE SHIELD | Admitting: Family Medicine

## 2017-05-31 VITALS — BP 162/98 | Ht 72.0 in | Wt 216.2 lb

## 2017-05-31 DIAGNOSIS — Z125 Encounter for screening for malignant neoplasm of prostate: Secondary | ICD-10-CM | POA: Diagnosis not present

## 2017-05-31 DIAGNOSIS — E785 Hyperlipidemia, unspecified: Secondary | ICD-10-CM | POA: Diagnosis not present

## 2017-05-31 DIAGNOSIS — F321 Major depressive disorder, single episode, moderate: Secondary | ICD-10-CM | POA: Diagnosis not present

## 2017-05-31 DIAGNOSIS — R972 Elevated prostate specific antigen [PSA]: Secondary | ICD-10-CM

## 2017-05-31 DIAGNOSIS — I10 Essential (primary) hypertension: Secondary | ICD-10-CM

## 2017-05-31 MED ORDER — ALPRAZOLAM 1 MG PO TABS: ORAL_TABLET | ORAL | 5 refills | Status: DC

## 2017-05-31 MED ORDER — LOSARTAN POTASSIUM 100 MG PO TABS: 100.0000 mg | ORAL_TABLET | Freq: Every day | ORAL | 5 refills | Status: DC

## 2017-05-31 MED ORDER — FLUOXETINE HCL 20 MG PO TABS: 20.0000 mg | ORAL_TABLET | Freq: Every day | ORAL | 5 refills | Status: DC

## 2017-05-31 NOTE — Progress Notes (Signed)
   Subjective:    Patient ID: [name], male    DOB: 18-Jul-1961, 56 y.o.   MRN: [id number]  Hypertension  This is a chronic problem. The current episode started more than 1 year ago.   Blood pressure medicine and blood pressure levels reviewed today with patient. Compliant with blood pressure medicine. States does not miss a dose. No obvious side effects. Blood pressure generally good when checked elsewhere. Watching salt intake.  Patient compliant with insomnia medication. Generally takes most nights. No obvious morning drowsiness. Definitely helps patient sleep. Without it patient states would not get a good nights rest.  Has not seen the urologist yet, Having some migr, still sees [facility] DOC  Pos hx of depr in the family, sis committd suicide, mow was an alcoholic. Pt has had challenges with feeling doenwn latly   Patient states no other concerns this visit.   Review of Systems No headache, no major weight loss or weight gain, no chest pain no back pain abdominal pain no change in bowel habits complete ROS otherwise negative     Objective:   Physical Exam  Alert and oriented, vitals reviewed and stable, NAD ENT-TM's and ext canals WNL bilat via otoscopic exam Soft palate, tonsils and post pharynx WNL via oropharyngeal exam Neck-symmetric, no masses; thyroid nonpalpable and nontender Pulmonary-no tachypnea or accessory muscle use; Clear without wheezes via auscultation Card--no abnrml murmurs, rhythm reg and rate WNL Carotid pulses symmetric, without bruits       Assessment & Plan:  Impression 1 hypertension decent control discussed maintain same dose and medicines #2 depression discussed a lot of stress. Currently going through divorce. No suicidal thoughts. Will initiate Prozac. Patient is taking the past meds helped him. He does not feel he needs counseling #3 insomnia ongoing with need for medsm prescribed #4 elevated PSA patient did not follow-up with urologist as  he stated that he would. Plan check appropriate blood work. Medications refilled. Prozac initiated. Diet exercise discussed. Further recommendations based on results. Follow-up in 6 months earlier if anything worsens. Warning signs as far as worsening depression also discussed

## 2017-06-01 ENCOUNTER — Telehealth: Payer: Self-pay | Admitting: Family Medicine

## 2017-06-01 LAB — BASIC METABOLIC PANEL
BUN/Creatinine Ratio: 12 (ref 9–20)
BUN: 19 mg/dL (ref 6–24)
CALCIUM: 10 mg/dL (ref 8.7–10.2)
CHLORIDE: 101 mmol/L (ref 96–106)
CO2: 27 mmol/L (ref 20–29)
Creatinine, Ser: 1.54 mg/dL — ABNORMAL HIGH (ref 0.76–1.27)
GFR calc non Af Amer: 49 mL/min/{1.73_m2} — ABNORMAL LOW (ref >59)
GFR, EST AFRICAN AMERICAN: 57 mL/min/{1.73_m2} — AB (ref >59)
Glucose: 98 mg/dL (ref 65–99)
Potassium: 4.7 mmol/L (ref 3.5–5.2)
Sodium: 145 mmol/L — ABNORMAL HIGH (ref 134–144)

## 2017-06-01 LAB — HEPATIC FUNCTION PANEL
ALK PHOS: 102 IU/L (ref 39–117)
ALT: 23 IU/L (ref 0–44)
AST: 22 IU/L (ref 0–40)
Albumin: 5.1 g/dL (ref 3.5–5.5)
Bilirubin Total: 0.3 mg/dL (ref 0.0–1.2)
Bilirubin, Direct: 0.08 mg/dL (ref 0.00–0.40)
Total Protein: 7.7 g/dL (ref 6.0–8.5)

## 2017-06-01 LAB — LIPID PANEL
CHOLESTEROL TOTAL: 187 mg/dL (ref 100–199)
Chol/HDL Ratio: 3.5 ratio (ref 0.0–5.0)
HDL: 54 mg/dL (ref >39)
LDL Calculated: 111 mg/dL — ABNORMAL HIGH (ref 0–99)
Triglycerides: 110 mg/dL (ref 0–149)
VLDL CHOLESTEROL CAL: 22 mg/dL (ref 5–40)

## 2017-06-01 LAB — PSA: Prostate Specific Ag, Serum: 11.3 ng/mL — ABNORMAL HIGH (ref 0.0–4.0)

## 2017-06-01 MED ORDER — ALPRAZOLAM 1 MG PO TABS: ORAL_TABLET | ORAL | 5 refills | Status: DC

## 2017-06-01 NOTE — Telephone Encounter (Signed)
Patient said that a prescription was sent over to [facility] in [place] yesterday for him, but the pharmacy was unable to read the Rx.  He said he does not know the name of the Rx, but is hoping this can be sent back over to them.

## 2017-06-01 NOTE — Telephone Encounter (Signed)
Prescription re-faxed to pharmacy.

## 2017-06-02 MED ORDER — DILTIAZEM HCL ER COATED BEADS 120 MG PO CP24: 120.0000 mg | ORAL_CAPSULE | Freq: Two times a day (BID) | ORAL | 5 refills | Status: DC

## 2017-06-02 NOTE — Addendum Note (Signed)
Addended by: Karle Barr on: 06/02/2017 08:35 AM   Modules accepted: Orders

## 2017-06-08 DIAGNOSIS — G43719 Chronic migraine without aura, intractable, without status migrainosus: Secondary | ICD-10-CM | POA: Diagnosis not present

## 2017-06-08 DIAGNOSIS — M542 Cervicalgia: Secondary | ICD-10-CM | POA: Diagnosis not present

## 2017-06-08 DIAGNOSIS — M791 Myalgia: Secondary | ICD-10-CM | POA: Diagnosis not present

## 2017-06-08 DIAGNOSIS — G518 Other disorders of facial nerve: Secondary | ICD-10-CM | POA: Diagnosis not present

## 2017-06-08 DIAGNOSIS — R51 Headache: Secondary | ICD-10-CM | POA: Diagnosis not present

## 2017-06-30 DIAGNOSIS — G4730 Sleep apnea, unspecified: Secondary | ICD-10-CM | POA: Diagnosis not present

## 2017-07-26 ENCOUNTER — Other Ambulatory Visit (HOSPITAL_COMMUNITY)
Admission: RE | Admit: 2017-07-26 | Discharge: 2017-07-26 | Disposition: A | Discharge status: Home / Self Care | Payer: BLUE CROSS/BLUE SHIELD | Source: Other Acute Inpatient Hospital | Attending: Urology | Admitting: Urology

## 2017-07-26 ENCOUNTER — Ambulatory Visit (INDEPENDENT_AMBULATORY_CARE_PROVIDER_SITE_OTHER): Payer: BLUE CROSS/BLUE SHIELD | Admitting: Urology

## 2017-07-26 DIAGNOSIS — R972 Elevated prostate specific antigen [PSA]: Secondary | ICD-10-CM | POA: Insufficient documentation

## 2017-07-26 DIAGNOSIS — N400 Enlarged prostate without lower urinary tract symptoms: Secondary | ICD-10-CM | POA: Diagnosis not present

## 2017-07-30 DIAGNOSIS — G4730 Sleep apnea, unspecified: Secondary | ICD-10-CM | POA: Diagnosis not present

## 2017-08-03 LAB — MISC LABCORP TEST (SEND OUT): Labcorp test code: 489160

## 2017-08-30 DIAGNOSIS — G4730 Sleep apnea, unspecified: Secondary | ICD-10-CM | POA: Diagnosis not present

## 2017-09-20 DIAGNOSIS — G43719 Chronic migraine without aura, intractable, without status migrainosus: Secondary | ICD-10-CM | POA: Diagnosis not present

## 2017-09-20 DIAGNOSIS — R51 Headache: Secondary | ICD-10-CM | POA: Diagnosis not present

## 2017-09-20 DIAGNOSIS — M542 Cervicalgia: Secondary | ICD-10-CM | POA: Diagnosis not present

## 2017-09-20 DIAGNOSIS — G518 Other disorders of facial nerve: Secondary | ICD-10-CM | POA: Diagnosis not present

## 2017-09-20 DIAGNOSIS — M791 Myalgia, unspecified site: Secondary | ICD-10-CM | POA: Diagnosis not present

## 2017-09-29 DIAGNOSIS — G4730 Sleep apnea, unspecified: Secondary | ICD-10-CM | POA: Diagnosis not present

## 2017-10-30 DIAGNOSIS — G4730 Sleep apnea, unspecified: Secondary | ICD-10-CM | POA: Diagnosis not present

## 2017-11-29 ENCOUNTER — Ambulatory Visit: Payer: BLUE CROSS/BLUE SHIELD | Admitting: Family Medicine

## 2017-11-30 DIAGNOSIS — G4730 Sleep apnea, unspecified: Secondary | ICD-10-CM | POA: Diagnosis not present

## 2017-12-20 ENCOUNTER — Ambulatory Visit: Payer: BLUE CROSS/BLUE SHIELD | Admitting: Family Medicine

## 2017-12-20 ENCOUNTER — Encounter: Payer: Self-pay | Admitting: Family Medicine

## 2017-12-20 VITALS — BP 142/84 | Ht 72.0 in | Wt 223.6 lb

## 2017-12-20 DIAGNOSIS — F5101 Primary insomnia: Secondary | ICD-10-CM | POA: Diagnosis not present

## 2017-12-20 DIAGNOSIS — Z1211 Encounter for screening for malignant neoplasm of colon: Secondary | ICD-10-CM

## 2017-12-20 DIAGNOSIS — I10 Essential (primary) hypertension: Secondary | ICD-10-CM | POA: Diagnosis not present

## 2017-12-20 DIAGNOSIS — E785 Hyperlipidemia, unspecified: Secondary | ICD-10-CM | POA: Diagnosis not present

## 2017-12-20 DIAGNOSIS — R972 Elevated prostate specific antigen [PSA]: Secondary | ICD-10-CM | POA: Diagnosis not present

## 2017-12-20 MED ORDER — FLUOXETINE HCL 20 MG PO TABS: 40.0000 mg | ORAL_TABLET | Freq: Every day | ORAL | 5 refills | Status: DC

## 2017-12-20 MED ORDER — DILTIAZEM HCL ER COATED BEADS 120 MG PO CP24: 120.0000 mg | ORAL_CAPSULE | Freq: Two times a day (BID) | ORAL | 5 refills | Status: DC

## 2017-12-20 MED ORDER — LOSARTAN POTASSIUM 100 MG PO TABS: 100.0000 mg | ORAL_TABLET | Freq: Every day | ORAL | 5 refills | Status: DC

## 2017-12-20 MED ORDER — ALPRAZOLAM 1 MG PO TABS: ORAL_TABLET | ORAL | 5 refills | Status: DC

## 2017-12-20 NOTE — Progress Notes (Signed)
   Subjective:    Patient ID: [name], male    DOB: May 08, 1961, 57 y.o.   MRN: [id number]  HPI Patient here today for 6 month follow up on Anxiety. Pt states the he did not get Xanax from [facility] in [place].(Pharmacy was unable to read script)   Blood pressure medicine and blood pressure levels reviewed today with patient. Compliant with blood pressure medicine. States does not miss a dose. No obvious side effects. Blood pressure generally good when checked elsewhere. Watching salt intake.   Patient notes ongoing compliance with antidepressant medication. No obvious side effects. Reports does not miss a dose. Overall continues to help depression substantially. No thoughts of homicide or suicide. Would like to maintain medication.  Patient compliant with insomnia medication. Generally takes most nights. No obvious morning drowsiness. Definitely helps patient sleep. Without it patient states would not get a good nights rest.   diet overall good but not great  b p usually pretty good  exrcising quit e a bit with wor  Review of Systems No headache, no major weight loss or weight gain, no chest pain no back pain abdominal pain no change in bowel habits complete ROS otherwise negative     Objective:   Physical Exam  Alert and oriented, vitals reviewed and stable, NAD ENT-TM's and ext canals WNL bilat via otoscopic exam Soft palate, tonsils and post pharynx WNL via oropharyngeal exam Neck-symmetric, no masses; thyroid nonpalpable and nontender Pulmonary-no tachypnea or accessory muscle use; Clear without wheezes via auscultation Card--no abnrml murmurs, rhythm reg and rate WNL Carotid pulses symmetric, without bruits       Assessment & Plan:  Impression hypertension.  Good control discussed.  To maintain same treatment compliance discussed  2.  Depression.  Still suffering effects from separation from wife.  No suicidal or homicidal thoughts.  Feels medication  definitely benefits will maintain  3.  Insomnia ongoing.  Will benefit from meds meds  4.  Very elevated PSA.  Followed by urologist.  Importance of maintaining contact discussed  We will help set up for screening colonoscopy follow-up in 6 months

## 2017-12-22 ENCOUNTER — Encounter: Payer: Self-pay | Admitting: Family Medicine

## 2017-12-28 ENCOUNTER — Encounter: Payer: Self-pay | Admitting: Internal Medicine

## 2017-12-28 DIAGNOSIS — G4730 Sleep apnea, unspecified: Secondary | ICD-10-CM | POA: Diagnosis not present

## 2018-01-28 DIAGNOSIS — G4730 Sleep apnea, unspecified: Secondary | ICD-10-CM | POA: Diagnosis not present

## 2018-02-23 ENCOUNTER — Ambulatory Visit: Payer: BLUE CROSS/BLUE SHIELD | Admitting: Nurse Practitioner

## 2018-02-23 ENCOUNTER — Telehealth: Payer: Self-pay | Admitting: Nurse Practitioner

## 2018-02-23 DIAGNOSIS — G518 Other disorders of facial nerve: Secondary | ICD-10-CM | POA: Diagnosis not present

## 2018-02-23 DIAGNOSIS — G500 Trigeminal neuralgia: Secondary | ICD-10-CM | POA: Diagnosis not present

## 2018-02-23 DIAGNOSIS — M791 Myalgia, unspecified site: Secondary | ICD-10-CM | POA: Diagnosis not present

## 2018-02-23 DIAGNOSIS — M542 Cervicalgia: Secondary | ICD-10-CM | POA: Diagnosis not present

## 2018-02-23 DIAGNOSIS — R51 Headache: Secondary | ICD-10-CM | POA: Diagnosis not present

## 2018-02-23 DIAGNOSIS — G43719 Chronic migraine without aura, intractable, without status migrainosus: Secondary | ICD-10-CM | POA: Diagnosis not present

## 2018-02-23 DIAGNOSIS — G509 Disorder of trigeminal nerve, unspecified: Secondary | ICD-10-CM | POA: Diagnosis not present

## 2018-02-23 NOTE — Telephone Encounter (Signed)
Patient was a no show and letter sent  °

## 2018-02-27 DIAGNOSIS — G4730 Sleep apnea, unspecified: Secondary | ICD-10-CM | POA: Diagnosis not present

## 2018-02-27 NOTE — Telephone Encounter (Signed)
Noted  

## 2018-03-30 DIAGNOSIS — G4730 Sleep apnea, unspecified: Secondary | ICD-10-CM | POA: Diagnosis not present

## 2018-04-29 DIAGNOSIS — G4730 Sleep apnea, unspecified: Secondary | ICD-10-CM | POA: Diagnosis not present

## 2018-05-30 DIAGNOSIS — G4730 Sleep apnea, unspecified: Secondary | ICD-10-CM | POA: Diagnosis not present

## 2018-06-27 ENCOUNTER — Ambulatory Visit: Payer: BLUE CROSS/BLUE SHIELD | Admitting: Family Medicine

## 2018-06-30 DIAGNOSIS — G4730 Sleep apnea, unspecified: Secondary | ICD-10-CM | POA: Diagnosis not present

## 2018-07-04 DIAGNOSIS — M542 Cervicalgia: Secondary | ICD-10-CM | POA: Diagnosis not present

## 2018-07-04 DIAGNOSIS — G518 Other disorders of facial nerve: Secondary | ICD-10-CM | POA: Diagnosis not present

## 2018-07-04 DIAGNOSIS — G43719 Chronic migraine without aura, intractable, without status migrainosus: Secondary | ICD-10-CM | POA: Diagnosis not present

## 2018-07-04 DIAGNOSIS — R51 Headache: Secondary | ICD-10-CM | POA: Diagnosis not present

## 2018-07-04 DIAGNOSIS — M791 Myalgia, unspecified site: Secondary | ICD-10-CM | POA: Diagnosis not present

## 2018-07-13 ENCOUNTER — Inpatient Hospital Stay
Admission: AD | Admit: 2018-07-13 | Payer: Self-pay | Source: Other Acute Inpatient Hospital | Admitting: Internal Medicine

## 2018-07-14 ENCOUNTER — Inpatient Hospital Stay (HOSPITAL_COMMUNITY): Payer: BLUE CROSS/BLUE SHIELD

## 2018-07-14 ENCOUNTER — Encounter (HOSPITAL_COMMUNITY): Payer: Self-pay

## 2018-07-14 ENCOUNTER — Inpatient Hospital Stay (HOSPITAL_COMMUNITY)
Admission: AD | Admit: 2018-07-14 | Discharge: 2018-07-20 | DRG: 305 | Disposition: A | Discharge status: Home / Self Care | Payer: BLUE CROSS/BLUE SHIELD | Source: Other Acute Inpatient Hospital | Attending: Internal Medicine | Admitting: Internal Medicine

## 2018-07-14 DIAGNOSIS — Z888 Allergy status to other drugs, medicaments and biological substances status: Secondary | ICD-10-CM

## 2018-07-14 DIAGNOSIS — G43911 Migraine, unspecified, intractable, with status migrainosus: Secondary | ICD-10-CM | POA: Diagnosis present

## 2018-07-14 DIAGNOSIS — G43011 Migraine without aura, intractable, with status migrainosus: Secondary | ICD-10-CM | POA: Diagnosis not present

## 2018-07-14 DIAGNOSIS — I5030 Unspecified diastolic (congestive) heart failure: Secondary | ICD-10-CM | POA: Diagnosis not present

## 2018-07-14 DIAGNOSIS — J9811 Atelectasis: Secondary | ICD-10-CM | POA: Diagnosis not present

## 2018-07-14 DIAGNOSIS — G43901 Migraine, unspecified, not intractable, with status migrainosus: Secondary | ICD-10-CM

## 2018-07-14 DIAGNOSIS — I161 Hypertensive emergency: Secondary | ICD-10-CM | POA: Diagnosis not present

## 2018-07-14 DIAGNOSIS — Z79899 Other long term (current) drug therapy: Secondary | ICD-10-CM

## 2018-07-14 DIAGNOSIS — F1910 Other psychoactive substance abuse, uncomplicated: Secondary | ICD-10-CM | POA: Diagnosis not present

## 2018-07-14 DIAGNOSIS — N179 Acute kidney failure, unspecified: Secondary | ICD-10-CM | POA: Diagnosis not present

## 2018-07-14 DIAGNOSIS — F1410 Cocaine abuse, uncomplicated: Secondary | ICD-10-CM | POA: Diagnosis present

## 2018-07-14 DIAGNOSIS — M25561 Pain in right knee: Secondary | ICD-10-CM | POA: Diagnosis present

## 2018-07-14 DIAGNOSIS — Z86718 Personal history of other venous thrombosis and embolism: Secondary | ICD-10-CM

## 2018-07-14 DIAGNOSIS — N183 Chronic kidney disease, stage 3 (moderate): Secondary | ICD-10-CM | POA: Diagnosis present

## 2018-07-14 DIAGNOSIS — I160 Hypertensive urgency: Secondary | ICD-10-CM | POA: Diagnosis not present

## 2018-07-14 DIAGNOSIS — R0789 Other chest pain: Secondary | ICD-10-CM | POA: Diagnosis not present

## 2018-07-14 DIAGNOSIS — R079 Chest pain, unspecified: Secondary | ICD-10-CM

## 2018-07-14 DIAGNOSIS — F17210 Nicotine dependence, cigarettes, uncomplicated: Secondary | ICD-10-CM | POA: Diagnosis present

## 2018-07-14 DIAGNOSIS — I517 Cardiomegaly: Secondary | ICD-10-CM | POA: Diagnosis not present

## 2018-07-14 DIAGNOSIS — F1210 Cannabis abuse, uncomplicated: Secondary | ICD-10-CM | POA: Diagnosis not present

## 2018-07-14 DIAGNOSIS — I129 Hypertensive chronic kidney disease with stage 1 through stage 4 chronic kidney disease, or unspecified chronic kidney disease: Secondary | ICD-10-CM | POA: Diagnosis not present

## 2018-07-14 DIAGNOSIS — G4459 Other complicated headache syndrome: Secondary | ICD-10-CM | POA: Diagnosis not present

## 2018-07-14 DIAGNOSIS — F329 Major depressive disorder, single episode, unspecified: Secondary | ICD-10-CM | POA: Diagnosis not present

## 2018-07-14 DIAGNOSIS — W06XXXA Fall from bed, initial encounter: Secondary | ICD-10-CM | POA: Diagnosis not present

## 2018-07-14 DIAGNOSIS — Y92230 Patient room in hospital as the place of occurrence of the external cause: Secondary | ICD-10-CM | POA: Diagnosis not present

## 2018-07-14 DIAGNOSIS — R51 Headache: Secondary | ICD-10-CM | POA: Diagnosis not present

## 2018-07-14 DIAGNOSIS — R072 Precordial pain: Secondary | ICD-10-CM | POA: Diagnosis not present

## 2018-07-14 HISTORY — DX: Depression, unspecified: F32.A

## 2018-07-14 HISTORY — DX: Major depressive disorder, single episode, unspecified: F32.9

## 2018-07-14 LAB — MAGNESIUM: Magnesium: 2 mg/dL (ref 1.7–2.4)

## 2018-07-14 LAB — CBC WITH DIFFERENTIAL/PLATELET
ABS IMMATURE GRANULOCYTES: 0 10*3/uL (ref 0.0–0.1)
Basophils Absolute: 0 10*3/uL (ref 0.0–0.1)
Basophils Relative: 0 %
EOS ABS: 0 10*3/uL (ref 0.0–0.7)
Eosinophils Relative: 1 %
HEMATOCRIT: 43.8 % (ref 39.0–52.0)
Hemoglobin: 14 g/dL (ref 13.0–17.0)
IMMATURE GRANULOCYTES: 1 %
LYMPHS ABS: 1.3 10*3/uL (ref 0.7–4.0)
Lymphocytes Relative: 16 %
MCH: 23.1 pg — ABNORMAL LOW (ref 26.0–34.0)
MCHC: 32 g/dL (ref 30.0–36.0)
MCV: 72.4 fL — AB (ref 78.0–100.0)
MONOS PCT: 9 %
Monocytes Absolute: 0.7 10*3/uL (ref 0.1–1.0)
NEUTROS PCT: 73 %
Neutro Abs: 6.2 10*3/uL (ref 1.7–7.7)
Platelets: 192 10*3/uL (ref 150–400)
RBC: 6.05 MIL/uL — ABNORMAL HIGH (ref 4.22–5.81)
RDW: 14.6 % (ref 11.5–15.5)
WBC: 8.3 10*3/uL (ref 4.0–10.5)

## 2018-07-14 LAB — LACTIC ACID, PLASMA
LACTIC ACID, VENOUS: 0.9 mmol/L (ref 0.5–1.9)
Lactic Acid, Venous: 0.9 mmol/L (ref 0.5–1.9)

## 2018-07-14 LAB — COMPREHENSIVE METABOLIC PANEL
ALBUMIN: 4 g/dL (ref 3.5–5.0)
ALK PHOS: 86 U/L (ref 38–126)
ALT: 15 U/L (ref 0–44)
ANION GAP: 11 (ref 5–15)
AST: 15 U/L (ref 15–41)
BILIRUBIN TOTAL: 0.7 mg/dL (ref 0.3–1.2)
BUN: 13 mg/dL (ref 6–20)
CALCIUM: 9.7 mg/dL (ref 8.9–10.3)
CO2: 23 mmol/L (ref 22–32)
Chloride: 103 mmol/L (ref 98–111)
Creatinine, Ser: 1.39 mg/dL — ABNORMAL HIGH (ref 0.61–1.24)
GFR calc Af Amer: >60 mL/min (ref >60)
GFR calc non Af Amer: 54 mL/min — ABNORMAL LOW (ref >60)
GLUCOSE: 105 mg/dL — AB (ref 70–99)
Potassium: 4 mmol/L (ref 3.5–5.1)
SODIUM: 137 mmol/L (ref 135–145)
Total Protein: 7.8 g/dL (ref 6.5–8.1)

## 2018-07-14 LAB — BRAIN NATRIURETIC PEPTIDE: B Natriuretic Peptide: 30.2 pg/mL (ref 0.0–100.0)

## 2018-07-14 LAB — MRSA PCR SCREENING: MRSA by PCR: POSITIVE — AB

## 2018-07-14 LAB — PROTIME-INR
INR: 1.09
Prothrombin Time: 14 seconds (ref 11.4–15.2)

## 2018-07-14 LAB — PHOSPHORUS: PHOSPHORUS: 4.5 mg/dL (ref 2.5–4.6)

## 2018-07-14 LAB — TROPONIN I: Troponin I: <0.03 ng/mL (ref <0.03)

## 2018-07-14 LAB — HIV ANTIBODY (ROUTINE TESTING W REFLEX): HIV Screen 4th Generation wRfx: NONREACTIVE

## 2018-07-14 MED ORDER — CLEVIDIPINE BUTYRATE 0.5 MG/ML IV EMUL
0.0000 mg/h | INTRAVENOUS | Status: DC
  Filled 2018-07-14: qty 50

## 2018-07-14 MED ORDER — PANTOPRAZOLE SODIUM 40 MG PO TBEC: 40.0000 mg | DELAYED_RELEASE_TABLET | Freq: Every day | ORAL | Status: DC

## 2018-07-14 MED ORDER — ASPIRIN 81 MG PO CHEW: 324.0000 mg | CHEWABLE_TABLET | ORAL | Status: DC

## 2018-07-14 MED ORDER — SODIUM CHLORIDE 0.9 % IV BOLUS
1000.0000 mL | Freq: Once | INTRAVENOUS | Status: AC
  Administered 2018-07-14: 1000 mL via INTRAVENOUS

## 2018-07-14 MED ORDER — KETOROLAC TROMETHAMINE 30 MG/ML IJ SOLN
30.0000 mg | Freq: Once | INTRAMUSCULAR | Status: AC
  Administered 2018-07-14: 30 mg via INTRAVENOUS
  Filled 2018-07-14: qty 1

## 2018-07-14 MED ORDER — CHLORHEXIDINE GLUCONATE CLOTH 2 % EX PADS
6.0000 | MEDICATED_PAD | Freq: Every day | CUTANEOUS | Status: AC
  Administered 2018-07-14 – 2018-07-18 (×3): 6 via TOPICAL

## 2018-07-14 MED ORDER — MUPIROCIN 2 % EX OINT
1.0000 "application " | TOPICAL_OINTMENT | Freq: Two times a day (BID) | CUTANEOUS | Status: AC
  Administered 2018-07-14 – 2018-07-17 (×8): 1 via NASAL
  Filled 2018-07-14 (×4): qty 22

## 2018-07-14 MED ORDER — AMLODIPINE BESYLATE 10 MG PO TABS
10.0000 mg | ORAL_TABLET | Freq: Every day | ORAL | Status: DC
  Administered 2018-07-15: 10 mg via ORAL
  Filled 2018-07-14: qty 1

## 2018-07-14 MED ORDER — DEXAMETHASONE SODIUM PHOSPHATE 10 MG/ML IJ SOLN
10.0000 mg | Freq: Once | INTRAMUSCULAR | Status: AC
  Administered 2018-07-14: 10 mg via INTRAVENOUS
  Filled 2018-07-14: qty 1

## 2018-07-14 MED ORDER — ACETAMINOPHEN 325 MG PO TABS
650.0000 mg | ORAL_TABLET | ORAL | Status: DC | PRN
  Administered 2018-07-14 – 2018-07-18 (×5): 650 mg via ORAL
  Filled 2018-07-14 (×5): qty 2

## 2018-07-14 MED ORDER — NICARDIPINE HCL IN NACL 20-0.86 MG/200ML-% IV SOLN
3.0000 mg/h | INTRAVENOUS | Status: DC
  Filled 2018-07-14: qty 200

## 2018-07-14 MED ORDER — VENLAFAXINE HCL ER 37.5 MG PO CP24
37.5000 mg | ORAL_CAPSULE | Freq: Every day | ORAL | Status: DC
  Administered 2018-07-14 – 2018-07-20 (×7): 37.5 mg via ORAL
  Filled 2018-07-14 (×8): qty 1

## 2018-07-14 MED ORDER — ASPIRIN EC 81 MG PO TBEC
81.0000 mg | DELAYED_RELEASE_TABLET | Freq: Every day | ORAL | Status: DC
  Administered 2018-07-14 – 2018-07-20 (×7): 81 mg via ORAL
  Filled 2018-07-14 (×7): qty 1

## 2018-07-14 MED ORDER — HEPARIN SODIUM (PORCINE) 5000 UNIT/ML IJ SOLN
5000.0000 [IU] | Freq: Three times a day (TID) | INTRAMUSCULAR | Status: DC
  Administered 2018-07-14 – 2018-07-20 (×19): 5000 [IU] via SUBCUTANEOUS
  Filled 2018-07-14 (×21): qty 1

## 2018-07-14 MED ORDER — AMLODIPINE BESYLATE 5 MG PO TABS
5.0000 mg | ORAL_TABLET | Freq: Every day | ORAL | Status: DC
  Administered 2018-07-14: 5 mg via ORAL
  Filled 2018-07-14: qty 1

## 2018-07-14 MED ORDER — HYDROCODONE-ACETAMINOPHEN 5-325 MG PO TABS
1.0000 | ORAL_TABLET | ORAL | Status: DC | PRN
  Administered 2018-07-14 – 2018-07-18 (×16): 1 via ORAL
  Filled 2018-07-14 (×17): qty 1

## 2018-07-14 MED ORDER — HYDROCHLOROTHIAZIDE 25 MG PO TABS
25.0000 mg | ORAL_TABLET | Freq: Every day | ORAL | Status: DC
  Administered 2018-07-14 – 2018-07-16 (×3): 25 mg via ORAL
  Filled 2018-07-14 (×3): qty 1

## 2018-07-14 MED ORDER — DIPHENHYDRAMINE HCL 50 MG/ML IJ SOLN
25.0000 mg | Freq: Once | INTRAMUSCULAR | Status: AC
  Administered 2018-07-14: 25 mg via INTRAVENOUS
  Filled 2018-07-14: qty 1

## 2018-07-14 MED ORDER — PROCHLORPERAZINE EDISYLATE 10 MG/2ML IJ SOLN
10.0000 mg | Freq: Once | INTRAMUSCULAR | Status: AC
  Administered 2018-07-14: 10 mg via INTRAVENOUS
  Filled 2018-07-14: qty 2

## 2018-07-14 MED ORDER — PROMETHAZINE HCL 25 MG/ML IJ SOLN
25.0000 mg | Freq: Once | INTRAMUSCULAR | Status: AC
  Administered 2018-07-14: 25 mg via INTRAVENOUS
  Filled 2018-07-14: qty 1

## 2018-07-14 NOTE — H&P (Addendum)
NAME:  [name], MRN:  [id number], DOB:  1960-11-29, LOS: 0 ADMISSION DATE:  07/14/2018 REFERRING MD:  Manning, CHIEF COMPLAINT:  HTN urgency   Brief History   57 yoM transferred from [facility] in [place] at family's request for ongoing care for HTN emergency, chest pain, right knee pain, and refractory migraine.  Pt positive for cocaine and THC on admit 9/25. [facility] workup with abnormal EKG but negative troponin x 3 and TTE showing normal LV and RV, G1DD.  CTA PE and RLE venous doppler negative.  CTH and MRI brain unremarkable.    Past Medical History  tobacco abuse, HTN, migraines, and polysubstance abuse  Significant Hospital Events   9/25 Admitted at [facility] 9/27 tx to [facility]  Consults: date of consult/date signed off & final recs:   Procedures (surgical and bedside):   Significant Diagnostic Tests:  9/25 CTA PE >> neg for PE; mild centrilobular emphysematous changes.   9/25 CTH >> non acute study 9/25 R venous dupplex u/s >> neg RLE venous doppler 9/25 R Knee 2 view >> unremarkable 9/25 CXR >> unremarkable 9/25 TTE >> Normal LV size and systolic function.  Mild to moderate concentric LVH.  G1DD, EF 55-60%.  Normral RV size and function. Structurally and functionally normal cardiac valves.    9/26 [place] retroperitoneal >> unremarkable retroperitoneal ultrasound without evidence for hydronephrosis.  Mass effect upon the basal bladder secondary to prostate enlargement.    9/26 MRI brain >> signal changes within the white matter distribution likely representing small vessel ischemic changes.  No acute abnormalities.  Micro Data:  9/27 MRSA PCR >>  Antimicrobials:  none  Subjective:  57 yoM transferred headache and some nausea.  No SOB or chest pain.  Objective   Temperature 99.1 F (37.3 C), temperature source Oral, height 6' (1.829 m), weight 92.4 kg.       No intake or output data in the 24 hours ending 07/14/18 0230 Filed Weights     07/14/18 0100  Weight: 92.4 kg    Examination: General:  Cooperative adult male lying in bed, appears uncomfortable in mild pain, shields light from his eyes HEENT: MM pink/moist, pupils 3/reactive, speech clear, no JVD Neuro: Alert, oriented, MAE, non focal  CV: SR 73, no murmur, +2 pulses PULM: even/non-labored, lungs bilaterally clear, no wheeze, slightly diminished in bases DE:YCXK, non-tender, bs active  Extremities: warm/dry, no edema  Skin: no rashes  Resolved Hospital Problem list    Assessment & Plan:  Hypertensive emergency  - in the setting of acute migraine pain and +UDS for cocaine - trop x 3 neg and TTE at OSH as above, EF 55-60% P:  Wean cardene for SBP < 160 Recheck, Troponin and EKG, lactate, renal panel  Check CXR Continue Norvasc 5 mg and ASA 81 mg daily Add  HCTZ 25 mg daily Hold coreg given cocaine use   Refractory Migraine  - avoid narcotics given potential for rebound  - neg OSH CTH/ MRI brain P:  Tylenol PRN zofran PRN if QTc wnl If renal function remains improved, consider Toradol if needed.    Depression  - SI at [facility]HS; cleared by psychiatry P:  Continue effexor XR 37.5mg  daily   Polysubstance abuse- cocaine/ THC - patient denies use, ongoing cessation counseling   Disposition / Summary of Today's Plan 07/14/18   Wean off Cardene gtt    Diet: clear liquids, if no vomiting advance to heart healthy  Pain/Anxiety/Delirium protocol (if  indicated): n/a VAP protocol (if indicated): n/a DVT prophylaxis: SQ heparin/ SCDs GI prophylaxis: protonix Hyperglycemia protocol: n/a Mobility: BR Code Status: Full   Family Communication: no family at bedside.  Reviewed plan of care with patient.  Labs   CBC: No results for input(s): WBC, NEUTROABS, HGB, HCT, MCV, PLT in the last 168 hours.  Basic Metabolic Panel: No results for input(s): NA, K, CL, CO2, GLUCOSE, BUN, CREATININE, CALCIUM, MG, PHOS in the last 168 hours. GFR: CrCl cannot be  calculated (Patient's most recent lab result is older than the maximum 21 days allowed.). No results for input(s): PROCALCITON, WBC, LATICACIDVEN in the last 168 hours.  Liver Function Tests: No results for input(s): AST, ALT, ALKPHOS, BILITOT, PROT, ALBUMIN in the last 168 hours. No results for input(s): LIPASE, AMYLASE in the last 168 hours. No results for input(s): AMMONIA in the last 168 hours.  ABG    Component Value Date/Time   TCO2 25 04/15/2008 2029     Coagulation Profile: No results for input(s): INR, PROTIME in the last 168 hours.  Cardiac Enzymes: No results for input(s): CKTOTAL, CKMB, CKMBINDEX, TROPONINI in the last 168 hours.  HbA1C: No results found for: HGBA1C  CBG: No results for input(s): GLUCAP in the last 168 hours.  Admitting History of Present Illness.   57 year old male with PMH of tobacco abuse, HTN, and migraine headaches who originally had right knee pain and sought medical care at [facility] in [place].  Found to be hypertensive with BP of 233/105.  Patient had admitted to some intermittent chest pain x 1 week and ongoing severe migraine.  In the ER, patient was agitated and placed on SI precautions.  UDS positive for cocaine and THC.  D dimer positive.  CTH, MRI brain, CTA PE, and RLE doppler exam all negative.  He was cleared by psychiatry for SI. Patient with ongoing headache with nausea and poor sleep since 9/25.   He was placed on cardene gtt for blood pressure control.  Since, troponin x 3 negative and TTE showing normal LV/ RV function, G1DD, and EF 55-60%.  Some note of AKI at 1.74, baseline appears to be ~1.5.  Patient apparently had disagreement with Cardiology for further cardiac workup and therefore patient requested to be transferred to [facility].    Review of Systems:   Review of Systems  Constitutional: Negative for diaphoresis, fever, malaise/fatigue and weight loss.  HENT: Negative for congestion.   Eyes: Positive for photophobia. Negative  for blurred vision.  Respiratory: Negative for cough, hemoptysis, sputum production, shortness of breath and wheezing.   Cardiovascular: Negative for chest pain, palpitations and leg swelling.  Gastrointestinal: Positive for nausea. Negative for abdominal pain and vomiting.  Musculoskeletal: Negative for neck pain.  Neurological: Positive for headaches. Negative for tingling, speech change, focal weakness, loss of consciousness and weakness.  Psychiatric/Behavioral: Negative for substance abuse and suicidal ideas.   Past Medical History  He,  has a past medical history of Depression, H/O blood clots (2008), Hypertension, Migraine, and Sleep apnea.   Surgical History    Past Surgical History:  Procedure Laterality Date  . HERNIA REPAIR    . KNEE SURGERY       Social History   Social History   Socioeconomic History  . Marital status: Legally Separated    Spouse name: Not on file  . Number of children: Not on file  . Years of education: Not on file  . Highest education level: Not on file  Occupational History  . Not on file  Social Needs  . Financial resource strain: Not hard at all  . Food insecurity:    Worry: Never true    Inability: Never true  . Transportation needs:    Medical: No    Non-medical: No  Tobacco Use  . Smoking status: Former Research scientist (life sciences)  . Smokeless tobacco: Never Used  Substance and Sexual Activity  . Alcohol use: Yes    Comment: "occasionally"  . Drug use: Yes    Types: Cocaine, Marijuana  . Sexual activity: Not on file  Lifestyle  . Physical activity:    Days per week: Patient refused    Minutes per session: Patient refused  . Stress: Not on file  Relationships  . Social connections:    Talks on phone: Not on file    Gets together: Not on file    Attends religious service: Not on file    Active member of club or organization: Not on file    Attends meetings of clubs or organizations: Not on file    Relationship status: Not on file  . Intimate  partner violence:    Fear of current or ex partner: Not on file    Emotionally abused: Not on file    Physically abused: Not on file    Forced sexual activity: Not on file  Other Topics Concern  . Not on file  Social History Narrative  . Not on file   Current every day smoker.   ETOH use occasionally on the weekends. Currently denies any illicit drug abuse.   Family History   His family history includes Diabetes in his father; Heart disease in his father; Stroke in his mother.   Allergies Allergies  Allergen Reactions  . Zoloft [Sertraline Hcl]   . Toprol Xl [Metoprolol Tartrate]     sluggish     Home Medications  Prior to Admission medications   Medication Sig Start Date End Date Taking? Authorizing Provider  ALPRAZolam Duanne Moron) 1 MG tablet Take one half to one qhs prn sleep 12/20/17   Mikey Kirschner, MD  diltiazem (CARDIZEM CD) 120 MG 24 hr capsule Take 1 capsule (120 mg total) by mouth 2 (two) times daily. 12/20/17   Mikey Kirschner, MD  FLUoxetine (PROZAC) 20 MG tablet Take 2 tablets (40 mg total) by mouth daily. 12/20/17   Mikey Kirschner, MD  losartan (COZAAR) 100 MG tablet Take 1 tablet (100 mg total) by mouth daily. 12/20/17   Mikey Kirschner, MD  UNKNOWN TO PATIENT Inject as directed every 3 (three) months. INJECTIONS for MIGRAINES administered by Orie Rout, MD at [facility], [place], [place]    [provider]     CCT 60 mins  Kennieth Rad, [place] Pgr: [phone] or if no answer [phone] 07/14/2018, 3:09 AM

## 2018-07-14 NOTE — Progress Notes (Addendum)
NAME:  [name], MRN:  [id number], DOB:  [birth year], LOS: 0 ADMISSION DATE:  07/14/2018, CONSULTATION DATE:  07/14/18 REFERRING MD:  [place], CHIEF COMPLAINT:  HTN urgency   Brief History    77 yoM transferred from [facility] in [place] at family's request for ongoing care for HTN emergency, chest pain, right knee pain, and refractory migraine.  Pt positive for cocaine and THC on admit 9/25. OHS workup with abnormal EKG but negative troponin x 3 and TTE showing normal LV and RV, G1DD.  CTA PE and RLE venous doppler negative.  CTH and MRI brain unremarkable.  Past Medical History  tobacco abuse, HTN, migraines, and polysubstance abuse   Significant Hospital Events   *9/25 Admitted at [facility]  9/27 tx to [facility] Consults: date of consult/date signed off & final recs:  Neurology  Procedures (surgical and bedside):  none  Significant Diagnostic Tests:  9/25 CTA PE >> neg for PE; mild centrilobular emphysematous changes.   9/25 CTH >> non acute study 9/25 R venous dupplex u/s >> neg RLE venous doppler 9/25 R Knee 2 view >> unremarkable 9/25 CXR >> unremarkable 9/25 TTE >> Normal LV size and systolic function.  Mild to moderate concentric LVH.  G1DD, EF 55-60%.  Normral RV size and function. Structurally and functionally normal cardiac valves.    9/26 [place] retroperitoneal >> unremarkable retroperitoneal ultrasound without evidence for hydronephrosis.  Mass effect upon the basal bladder secondary to prostate enlargement.    9/26 MRI brain >> signal changes within the white matter distribution likely representing small vessel ischemic changes.  No acute abnormalities.   Subjective:  Continues to complain of severe migraine headache.    Reports frequent stabbing R hemicranial headaches with scotomata. Associated with nausea.  Told he had cluster migraines and receives trigger point injections with variable results.  Patient denies cocaine use.   Occasional CP with exertion but none now.  Objective   Blood pressure (!) 153/108, pulse 79, temperature 98.2 F (36.8 C), temperature source Oral, resp. rate 20, height 6' (1.829 m), weight 92.4 kg, SpO2 96 %.       No intake or output data in the 24 hours ending 07/14/18 1003 Filed Weights   07/14/18 0100  Weight: 92.4 kg    Examination: General: thin man. Appears in considerable pain.                                      HENT: No scleral injection. Lungs: Chest clear Cardiovascular: no JVD, S4+ no edema. No peripheral bruits no radio-femoral delay. Abdomen: Soft  Extremities: No active joint disease Neuro: No focal deficits. GU: Normal genitali  Resolved Hospital Problem list   None  Assessment & Plan:   1. HTN Emergency (end organ dysfunction - rise in Creatinine)- titrating IV cardene infusion to prevent worsening acute target organ damage.  Restart oral meds including CCB, and Thiazide. No beta blockers due to cocaine+ history. 2. Migraine: Tylenol/ ASA/NSAIDS are typical first line followed by triptans. Creatinine noted. Will see how pt responds to tylenol and adjust regimen as needed. Avoid narcotics. Administer anti-nauseant and consult neurology 3. AKI: f/u Creatinine as BP normalizes and pt improves. He received a contrast load for CTA. Avoid nephrotoxic meds.  4. Cocaine + on UDS- avoid beta blockers in acute setting and if patient continues to use. Counseling on cessation initiated  Disposition /  Summary of Today's Plan 07/14/18   Control cephalgia and assess BP control needs as necessary.    Diet: Full Pain/Anxiety/Delirium protocol (if indicated): As above VAP protocol (if indicated): not intubated. DVT prophylaxis: heparin  GI prophylaxis: not indicated. Hyperglycemia protocol: normoglycemic. Mobility: Ad lib Code Status: full Family Communication: no family present.  Labs   CBC: Recent Labs  Lab 07/14/18 0223  WBC 8.3  NEUTROABS 6.2  HGB 14.0    HCT 43.8  MCV 72.4*  PLT 696    Basic Metabolic Panel: Recent Labs  Lab 07/14/18 0223  NA 137  K 4.0  CL 103  CO2 23  GLUCOSE 105*  BUN 13  CREATININE 1.39*  CALCIUM 9.7  MG 2.0  PHOS 4.5   GFR: Estimated Creatinine Clearance: 63.6 mL/min (A) (by C-G formula based on SCr of 1.39 mg/dL (H)). Recent Labs  Lab 07/14/18 0223 07/14/18 0719  WBC 8.3  --   LATICACIDVEN 0.9 0.9    Liver Function Tests: Recent Labs  Lab 07/14/18 0223  AST 15  ALT 15  ALKPHOS 86  BILITOT 0.7  PROT 7.8  ALBUMIN 4.0   No results for input(s): LIPASE, AMYLASE in the last 168 hours. No results for input(s): AMMONIA in the last 168 hours.  ABG    Component Value Date/Time   TCO2 25 04/15/2008 2029     Coagulation Profile: Recent Labs  Lab 07/14/18 0223  INR 1.09    Cardiac Enzymes: Recent Labs  Lab 07/14/18 0223 07/14/18 0719  TROPONINI <0.03 <0.03    HbA1C: No results found for: HGBA1C  CBG: No results for input(s): GLUCAP in the last 168 hours.    CRITICAL CARE Performed by: Kipp Brood   Total critical care time: 30 minutes  Critical care time was exclusive of separately billable procedures and treating other patients.  Critical care was necessary to treat or prevent imminent or life-threatening deterioration.  Critical care was time spent personally by me on the following activities: development of treatment plan with patient and/or surrogate as well as nursing, discussions with consultants, evaluation of patient's response to treatment, examination of patient, obtaining history from patient or surrogate, ordering and performing treatments and interventions, ordering and review of laboratory studies, ordering and review of radiographic studies, pulse oximetry and re-evaluation of patient's condition.  Kipp Brood, MD [facility] ICU Physician [place]  Pager: [phone] Mobile: [phone] After hours: [phone].

## 2018-07-14 NOTE — Consult Note (Addendum)
NEURO HOSPITALIST CONSULT NOTE   Requestig physician: Dr. Gilford Raid  Reason for Consult: Severe intractable migraine  History obtained from:  Patient     HPI:                                                                                                                                          [name] is an 57 y.o. male  With PMH HTN, Migraine, clots, tobacco abuse who presented to [facility] with right knee pain and was found to have SBP > 200. Per report, imaging of the brain there was normal.    Patient presented to [facility] for right knee pain and intermittent chest pain for the last week with ongoing severe migraine. This migraine has been going on since Monday 07/10/18 with no relief. He states at [facility] he was given an imitrex injection and that did help his migraine. Per patient he has had migraines for as long as he can remember. He states that he has been to [facility] for migraine treatment. ( care everywhere only has records from [facility] Patient states that he does not take any prescribed medication for his migraines. He uses OTC medicine ( tylenol, ibuprofen, BC powder) and powers through the pain. His migraines usually last a minimum of 3 days, and then they just go away. They start as feeling like mild tension HA and then progress to feeling " like needles are stabbing him behind the eyes. He endorses photophobia, phonophobia, osmophobia, and sometimes nausea and vomiting with his migraines. Occasionally but not always he will have diplopia or blurred vision. Patient endorses a plethora of triggers including ( kool-aid, soda, anything sweet, hot dogs, ice cream to name a few). For the past 2 years at least  Dr. Domingo Cocking has been performing  trigger point therapy q3 months for his migraines. He says that it does not really help. He will have a total of 16 days per month where he still has a migraine. 3 of those 16 are intense migraine days. Per  patient he has tried botox therapy for migraines and it did not really help but he stopped because of insurance. Patient endorses smoking 5 cigarettes per day. Patient re-started smoking 2 years ago, but had quit for about 2 years and has been smoking a total of 15 or so years. Endorses drinking 1-2 beers per weekend. Patient denies cocaine use or marijuana usage. States " I must have picked up the wrong drink. I don't know how cocaine got in my system" denies vaping.  Hospital course: Ct head  And MRI L unremarkeable ( [facility]) UDS: + cocaine and THC ([facility]) D- dimer: + CTA PE negative, RLE doppler negative creatinine: 1.74 ECHO EF 55-60% and normal systolic function with Grade 1  Diastolic Dysfunction (T0VX), no hypokinesis. Has received nitro drip, and cardene drip both have been stopped.    Chart review: Last neurology note was from [facility] 07/04/2006: At that time he was prescribed topamax 150 mg/day.  But d/t SE he was only able to tolerate 50 mg/ day and PRN for HA. He was also noted to have elevated BP's with his migraines and thus his PCP at that time was having difficulty managing BP. He has successfully been admitted on DHE protocol back in 02/2006.  Past Medical History:  Diagnosis Date  . Depression   . H/O blood clots 2008  . Hypertension   . Migraine   . Sleep apnea     Past Surgical History:  Procedure Laterality Date  . HERNIA REPAIR    . KNEE SURGERY      Family History  Problem Relation Age of Onset  . Stroke Mother   . Heart disease Father   . Diabetes Father           Social History:  reports that he has quit smoking. He has never used smokeless tobacco. He reports that he drinks alcohol. He reports that he has current or past drug history. Drugs: Cocaine and Marijuana.  Allergies  Allergen Reactions  . Zoloft [Sertraline Hcl]   . Toprol Xl [Metoprolol Tartrate]     sluggish    MEDICATIONS:                                                                                                                      Scheduled: . [START ON 07/15/2018] amLODipine  10 mg Oral Daily  . aspirin EC  81 mg Oral Daily  . Chlorhexidine Gluconate Cloth  6 each Topical Q0600  . dexamethasone  10 mg Intravenous Once  . diphenhydrAMINE  25 mg Intravenous Once  . heparin  5,000 Units Subcutaneous Q8H  . hydrochlorothiazide  25 mg Oral Daily  . mupirocin ointment  1 application Nasal BID  . prochlorperazine  10 mg Intravenous Once  . venlafaxine XR  37.5 mg Oral Q breakfast   Continuous: . clevidipine     BLT:[id number], HYDROcodone-acetaminophen   ROS:                                                                                                                                       ROS was performed and is negative except as noted  in HPI   Blood pressure (!) 155/126, pulse 63, temperature 98.2 F (36.8 C), temperature source Oral, resp. rate 16, height 6' (1.829 m), weight 92.4 kg, SpO2 94 %.   General Examination:                                                                                                       Physical Exam  HEENT-  Normocephalic, no lesions, without obvious abnormality.  Normal external eye and conjunctiva.   Cardiovascular- S1-S2 audible, pulses palpable throughout   Lungs-no rhonchi or wheezing noted, no excessive working breathing.  Saturations within normal limits on RA Abdomen- All 4 quadrants BS present Extremities- Warm, dry and intact Musculoskeletal-no joint tenderness, deformity or swelling Skin-warm and dry, no hyperpigmentation, vitiligo, or suspicious lesions  Neurological Examination Mental Status: Alert, oriented to person/ place/ month/year. Naming intact. thought content appropriate.  Speech fluent without evidence of aphasia.  Able to follow all commands without difficulty. Cranial Nerves: II:  Visual fields grossly normal. Photophobia is elicited with light III,IV, VI: ptosis not present, extra-ocular  motions intact bilaterally pupils equal, round, reactive to light  V,VII: smile symmetric, facial light touch sensation normal bilaterally VIII: hearing normal bilaterally IX,X: uvula rises symmetrically XI: bilateral shoulder shrug XII: midline tongue extension Motor: Right : Upper extremity   5/5  Left:     Upper extremity   5/5  Lower extremity   5/5  Lower extremity   5/5 Tone and bulk:normal tone throughout; no atrophy noted No jerking, twitching or other adventitious movements noted.  Sensory:  light touch intact throughout, bilaterally Deep Tendon Reflexes: 2+ and biceps, patella Plantars: Right: downgoing   Left: downgoing Cerebellar: Slow finger-to-nose,  normal heel-to-shin test Gait: deferred   Lab Results: Basic Metabolic Panel: Recent Labs  Lab 07/14/18 0223  NA 137  K 4.0  CL 103  CO2 23  GLUCOSE 105*  BUN 13  CREATININE 1.39*  CALCIUM 9.7  MG 2.0  PHOS 4.5    CBC: Recent Labs  Lab 07/14/18 0223  WBC 8.3  NEUTROABS 6.2  HGB 14.0  HCT 43.8  MCV 72.4*  PLT 192    Cardiac Enzymes: Recent Labs  Lab 07/14/18 0223 07/14/18 0719  TROPONINI <0.03 <0.03   Imaging: Dg Chest Port 1 View  Result Date: 07/14/2018 CLINICAL DATA:  Hypertensive emergency EXAM: PORTABLE CHEST 1 VIEW COMPARISON:  02/09/2015 chest radiograph. FINDINGS: Low lung volumes. Stable cardiomediastinal silhouette with normal heart size. No pneumothorax. No pleural effusion. Mild bibasilar atelectasis. No pulmonary edema. IMPRESSION: Low lung volumes with mild bibasilar atelectasis. Electronically Signed   By: Ilona Sorrel M.D.   On: 07/14/2018 02:39    Assessment:  57 year old AA male, With PMH HTN, Migraine, clots tobacco abuse who presented to [facility] for right knee pain and chest pain. Patient also found to have a severe migraine with SBP > 200. CT Head at [facility] - negative for hemorrhage.    Impression: 1. Status migrainosus. Cujrrently rates pain as 8/10, bifrontal  and throbbing. Patient does not recall receiving his  migraine cocktail today (decadron/compazine/benadryl) and also cannot state if his migraine improved at some point today 2. Unlikely to be due to HTN as with lowering of BP his headache has not resolved  Recommendations: -Bolus 1 L NS, followed by 25 mg IV phenergan -Gradually lower BP -Smoking cessation -Hydrate the patient well. May benefit from continuous IV infusion after bolus of NS. Patients with migraine often respond better to abortive and rescue medications for treatment of acute migraine when they are well hydrated.   Laurey Morale, MSN, NP-C [facility] [phone]   I have seen and examined the patient. I have amended the assessment and recommendations above.  Electronically signed: Dr. Kerney Elbe 07/14/2018, 10:51 AM

## 2018-07-15 ENCOUNTER — Other Ambulatory Visit: Payer: Self-pay

## 2018-07-15 ENCOUNTER — Inpatient Hospital Stay (HOSPITAL_COMMUNITY): Payer: BLUE CROSS/BLUE SHIELD

## 2018-07-15 LAB — TROPONIN I

## 2018-07-15 MED ORDER — DILTIAZEM HCL ER COATED BEADS 120 MG PO CP24
120.0000 mg | ORAL_CAPSULE | Freq: Two times a day (BID) | ORAL | Status: DC
  Administered 2018-07-15 – 2018-07-17 (×5): 120 mg via ORAL
  Filled 2018-07-15 (×5): qty 1

## 2018-07-15 MED ORDER — SODIUM CHLORIDE 0.9 % IV SOLN
INTRAVENOUS | Status: DC
  Administered 2018-07-15: 10:00:00 via INTRAVENOUS

## 2018-07-15 MED ORDER — MORPHINE SULFATE (PF) 2 MG/ML IV SOLN
2.0000 mg | Freq: Once | INTRAVENOUS | Status: AC
  Administered 2018-07-15: 2 mg via INTRAVENOUS

## 2018-07-15 MED ORDER — NITROGLYCERIN 0.4 MG SL SUBL
SUBLINGUAL_TABLET | SUBLINGUAL | Status: AC
  Administered 2018-07-15: 19:00:00
  Filled 2018-07-15: qty 1

## 2018-07-15 MED ORDER — LOSARTAN POTASSIUM 50 MG PO TABS
100.0000 mg | ORAL_TABLET | Freq: Every day | ORAL | Status: DC
  Administered 2018-07-15 – 2018-07-16 (×2): 100 mg via ORAL
  Filled 2018-07-15 (×2): qty 2

## 2018-07-15 MED ORDER — HYDRALAZINE HCL 20 MG/ML IJ SOLN
10.0000 mg | INTRAMUSCULAR | Status: DC | PRN
  Administered 2018-07-15 (×2): 40 mg via INTRAVENOUS
  Administered 2018-07-18 (×2): 20 mg via INTRAVENOUS
  Filled 2018-07-15: qty 1
  Filled 2018-07-15 (×2): qty 2
  Filled 2018-07-15: qty 1

## 2018-07-15 MED ORDER — BUTALBITAL-APAP-CAFFEINE 50-325-40 MG PO TABS
1.0000 | ORAL_TABLET | Freq: Once | ORAL | Status: AC
  Administered 2018-07-15: 1 via ORAL
  Filled 2018-07-15: qty 1

## 2018-07-15 MED ORDER — MAGNESIUM SULFATE 50 % IJ SOLN
1.0000 g | Freq: Once | INTRAMUSCULAR | Status: AC
  Administered 2018-07-15: 1 g via INTRAVENOUS
  Filled 2018-07-15 (×3): qty 2

## 2018-07-15 MED ORDER — SUMATRIPTAN SUCCINATE 50 MG PO TABS
50.0000 mg | ORAL_TABLET | Freq: Once | ORAL | Status: AC
  Administered 2018-07-15: 50 mg via ORAL
  Filled 2018-07-15: qty 1

## 2018-07-15 MED ORDER — SODIUM CHLORIDE 0.9 % IV BOLUS
1000.0000 mL | Freq: Once | INTRAVENOUS | Status: AC
  Administered 2018-07-15: 1000 mL via INTRAVENOUS

## 2018-07-15 MED ORDER — PROMETHAZINE HCL 25 MG/ML IJ SOLN
25.0000 mg | Freq: Once | INTRAMUSCULAR | Status: AC
  Administered 2018-07-15: 25 mg via INTRAVENOUS
  Filled 2018-07-15: qty 1

## 2018-07-15 MED ORDER — BISACODYL 5 MG PO TBEC
10.0000 mg | DELAYED_RELEASE_TABLET | Freq: Every day | ORAL | Status: DC | PRN
  Administered 2018-07-15 – 2018-07-19 (×3): 10 mg via ORAL
  Filled 2018-07-15 (×4): qty 2

## 2018-07-15 NOTE — Progress Notes (Signed)
MD Elsworth Soho called back stated to place portable stat chest xray, stat troponin and keep oxygen above 90%  EKG read to MD and placed in chart

## 2018-07-15 NOTE — Progress Notes (Signed)
Pt received norco at 1516, next dose available at 1916 Pt states tylenol does not help him   MD Ismel returned page stated to place order for 2mg  of morphine and no transfer at this time  MD aware we cannot keep active chest pain on this unit

## 2018-07-15 NOTE — Progress Notes (Signed)
57 year old man with history of migraines transferred from [facility] 9/27 for hypertensive emergency where he presented with chest pain and right knee pain. Urine toxicology was positive for cocaine. Neurology assisting for management of migraines.  He remains hypertensive and complains of headache On exam-alert, interactive, nonfocal, S1-S2 normal, no S3, no JVD or edema, clear breath sounds bilateral, soft and nontender abdomen.   Prior labs reviewed which show normal troponins and creatinine 1.4.  Impression/plan Hypertensive urgency-we will use hydralazine increased doses of 10 to 40 mg every 6 hours, Cocaine in his system prohibits labetalol use although for the route he gets we could probably use this medication. We will resume his home doses of Cardizem and losartan  Migraine -seems like this is unrelated to his hypertension, neurology assisting Substance abuse counseling.  Transfer to telemetry and to [facility] 9/29  Kara Mead MD. [facility]. [place] [facility] Pager [phone] If no response call [phone]   07/15/2018

## 2018-07-15 NOTE — Progress Notes (Signed)
Pt transferred from 2h to 3E22, on arrival he reported severe chest pain x1 hour and ongoing headache. Patient refusing nitro SL. PCCM paged, Dr. Elsworth Soho gave verbal orders to RN. Dr. Milon Dikes to bedside with additional orders given.  No interventions from RRT

## 2018-07-15 NOTE — Progress Notes (Addendum)
NEURO HOSPITALIST PROGRESS NOTE   Subjective: Patient states that the phenergan did help him sleep. Rates pain 7/10. Still has had continuous migraine. BP now 153/122 which has improved from SBP > 200 yesterday, but DBP still > 100. Overall patient presentation and affective pain symptoms are better than on exam 07/14/18. Photophobia still present.   Exam: Vitals:   07/15/18 0500 07/15/18 0600  BP: 135/87 (!) 142/99  Pulse: 65 71  Resp: 11 14  Temp:    SpO2: 96% 97%    Physical Exam  HEENT-  Normocephalic, no lesions, without obvious abnormality.  Normal external eye and conjunctiva.   Cardiovascular- S1-S2 audible, pulses palpable throughout   Lungs-no rhonchi or wheezing noted, no excessive work of breathing.  Saturations within normal limits on RA Abdomen- All 4 quadrants BS present Extremities- Warm, dry and intact Musculoskeletal-no joint tenderness, deformity or swelling Skin-warm and dry, no hyperpigmentation, vitiligo, or suspicious lesions   Neuro:  Mental Status: Alert, oriented to person/ place/ month/year. Naming intact. thought content appropriate.  Speech fluent without evidence of aphasia.  Able to follow all commands without difficulty. Cranial Nerves: II:  Visual fields grossly normal. Photophobia is elicited with light III,IV, VI: ptosis not present, extra-ocular motions intact bilaterally pupils equal, round, reactive to light  V,VII: smile symmetric, facial light touch sensation normal bilaterally VIII: hearing normal bilaterally IX,X: uvula rises symmetrically XI: bilateral shoulder shrug XII: midline tongue extension Motor: Right :  Upper extremity   5/5              Left:     Upper extremity   5/5             Lower extremity   5/5              Lower extremity   5/5 Tone and bulk:normal tone throughout; no atrophy noted No jerking, twitching or other adventitious movements noted.  Sensory:  light touch intact throughout,  bilaterally Deep Tendon Reflexes: 2+ and biceps, patella Plantars: Right: downgoing                                Left: downgoing Cerebellar: Slow finger-to-nose,  normal heel-to-shin test Gait: deferred    Medications:  Scheduled: . amLODipine  10 mg Oral Daily  . aspirin EC  81 mg Oral Daily  . Chlorhexidine Gluconate Cloth  6 each Topical Q0600  . heparin  5,000 Units Subcutaneous Q8H  . hydrochlorothiazide  25 mg Oral Daily  . mupirocin ointment  1 application Nasal BID  . venlafaxine XR  37.5 mg Oral Q breakfast   Continuous: . clevidipine     JJK:[id number], HYDROcodone-acetaminophen  Pertinent Labs/Diagnostics:   Dg Chest Port 1 View  Result Date: 07/14/2018 CLINICAL DATA:  Hypertensive emergency EXAM: PORTABLE CHEST 1 VIEW COMPARISON:  02/09/2015 chest radiograph. FINDINGS: Low lung volumes. Stable cardiomediastinal silhouette with normal heart size. No pneumothorax. No pleural effusion. Mild bibasilar atelectasis. No pulmonary edema. IMPRESSION: Low lung volumes with mild bibasilar atelectasis. Electronically Signed   By: Ilona Sorrel M.D.   On: 07/14/2018 02:39   Assessment:   57 year old AA male, With PMHx of HTN, migraine, blood clots and tobacco abuse, who presented to [facility] for right knee pain and chest pain. Patient also found to have a severe  migraine with SBP > 200. CT Head at [place] - negative for hemorrhage.  1. Pain still 7/10. Received phenergran 25mg  IV and 1L NS bolus 07/14/18 which helped some, pain having decreased from 8/10 to 7/10.  2. Overall presentation most consistent with status migrainosus. Currently rates pain as 7/10, bifrontal and throbbing.  3. Headache unlikely to be due to HTN as with lowering of BP his headache has not resolved  Recommendations:  -repeat Bolus 1 L NS, then maintenance of 100cc/hr x 24 hours. Repeat dose of 25 mg IV phenergan x 1 -Gradually lower BP -Smoking cessation -Hydrate the patient well. May  benefit from continuous IV infusion after bolus of NS. Patients with migraine often respond better to abortive and rescue medications for treatment of acute migraine when they are well hydrated.   Laurey Morale, MSN, NP-C [facility] [phone]   Electronically signed: Dr. Kerney Elbe 07/15/2018, 7:37 AM

## 2018-07-15 NOTE — Progress Notes (Signed)
Called RRT team to come to bedside and evaluate pt  Pt reports having chest pain for the last hour, untreated  Pt arrived on our unit, EKG completed, pt placed on [place] on pt room air was 60%, pt refused nitro states he has a severe headache  Paged NP awaiting call back RN at bedside

## 2018-07-16 DIAGNOSIS — F1910 Other psychoactive substance abuse, uncomplicated: Secondary | ICD-10-CM

## 2018-07-16 DIAGNOSIS — I160 Hypertensive urgency: Secondary | ICD-10-CM

## 2018-07-16 DIAGNOSIS — N179 Acute kidney failure, unspecified: Secondary | ICD-10-CM

## 2018-07-16 DIAGNOSIS — R079 Chest pain, unspecified: Secondary | ICD-10-CM

## 2018-07-16 LAB — GLUCOSE, CAPILLARY: Glucose-Capillary: 80 mg/dL (ref 70–99)

## 2018-07-16 LAB — BASIC METABOLIC PANEL
ANION GAP: 9 (ref 5–15)
BUN: 31 mg/dL — AB (ref 6–20)
CHLORIDE: 105 mmol/L (ref 98–111)
CO2: 24 mmol/L (ref 22–32)
Calcium: 8.9 mg/dL (ref 8.9–10.3)
Creatinine, Ser: 1.78 mg/dL — ABNORMAL HIGH (ref 0.61–1.24)
GFR calc Af Amer: 47 mL/min — ABNORMAL LOW (ref >60)
GFR calc non Af Amer: 40 mL/min — ABNORMAL LOW (ref >60)
GLUCOSE: 90 mg/dL (ref 70–99)
POTASSIUM: 4.8 mmol/L (ref 3.5–5.1)
Sodium: 138 mmol/L (ref 135–145)

## 2018-07-16 MED ORDER — HYDRALAZINE HCL 50 MG PO TABS
50.0000 mg | ORAL_TABLET | Freq: Three times a day (TID) | ORAL | Status: DC
  Administered 2018-07-16 – 2018-07-20 (×13): 50 mg via ORAL
  Filled 2018-07-16 (×13): qty 1

## 2018-07-16 NOTE — Progress Notes (Signed)
[facility] PROGRESS NOTE  DONTELL MIAN WRU:[id number] DOB: 02-Dec-1960 DOA: 07/14/2018  PCP: Mikey Kirschner, MD  Brief History/Interval Summary: 57 year old African-American male with a past medical history of essential hypertension, tobacco abuse, migraine headaches presented to the hospital in [place] with complains of right knee pain and was found to have systolic blood pressure greater than 200.  He also mentioned intermittent chest pain as well as severe migraine headaches.  Evaluation there was positive for a creatinine of 1.74.  Urine drug screen was positive for cocaine and marijuana.  CT head without contrast was negative.  CT angiogram of the chest was negative for PE.  Lower extremity Doppler studies were negative for DVT.  Echocardiogram showed normal systolic function with grade 1 diastolic dysfunction.  EF was 55 to 60%.  Patient was subsequently transferred here at the request of family.  Neurology was consulted.  Patient was initially admitted to the intensive care unit.  Transferred to the floor yesterday.  Reason for Visit: Migraine headaches.  Chest pain.  Polysubstance abuse.  Consultants: Neurology  Procedures: Transthoracic echocardiogram will be ordered  Antibiotics: None  Subjective/Interval History: Patient complains of 8 out of 10 headache.  He had chest pain last night but none this morning.  Denies any shortness of breath.  He does not know how his urine was positive for cocaine.  He absolutely denies using illicit drugs.  ROS: Denies any vomiting this morning.  Was nauseated briefly.  Objective:  Vital Signs  Vitals:   07/15/18 1919 07/16/18 0500 07/16/18 0638 07/16/18 1011  BP: (!) 154/97  (!) 164/104 (!) 156/104  Pulse:   (!) 36 71  Resp:   18   Temp:   98.3 F (36.8 C)   TempSrc:   Oral   SpO2:   100% 98%  Weight:  91.7 kg    Height:        Intake/Output Summary (Last 24 hours) at 07/16/2018 1038 Last data filed at  07/16/2018 0400 Gross per 24 hour  Intake 750.3 ml  Output 1190 ml  Net -439.7 ml   Filed Weights   07/15/18 0600 07/15/18 1727 07/16/18 0500  Weight: 91.7 kg 92 kg 91.7 kg    General appearance: alert, cooperative, appears stated age and no distress Head: Normocephalic, without obvious abnormality, atraumatic Resp: clear to auscultation bilaterally Cardio: regular rate and rhythm, S1, S2 normal, no murmur, click, rub or gallop GI: soft, non-tender; bowel sounds normal; no masses,  no organomegaly Extremities: extremities normal, atraumatic, no cyanosis or edema Neurologic: Alert and oriented x3.  Cranial nerves II to XII intact.  Motor strength equal bilateral upper and lower extremities.  Lab Results:  Data Reviewed: I have personally reviewed following labs and imaging studies  CBC: Recent Labs  Lab 07/14/18 0223  WBC 8.3  NEUTROABS 6.2  HGB 14.0  HCT 43.8  MCV 72.4*  PLT 914    Basic Metabolic Panel: Recent Labs  Lab 07/14/18 0223 07/16/18 0504  NA 137 138  K 4.0 4.8  CL 103 105  CO2 23 24  GLUCOSE 105* 90  BUN 13 31*  CREATININE 1.39* 1.78*  CALCIUM 9.7 8.9  MG 2.0  --   PHOS 4.5  --     GFR: Estimated Creatinine Clearance: 49.7 mL/min (A) (by C-G formula based on SCr of 1.78 mg/dL (H)).  Liver Function Tests: Recent Labs  Lab 07/14/18 0223  AST 15  ALT 15  ALKPHOS 86  BILITOT 0.7  PROT 7.8  ALBUMIN 4.0    Coagulation Profile: Recent Labs  Lab 07/14/18 0223  INR 1.09    Cardiac Enzymes: Recent Labs  Lab 07/14/18 0223 07/14/18 0719 07/14/18 1340 07/15/18 2017  TROPONINI <0.03 <0.03 <0.03 <0.03    CBG: Recent Labs  Lab 07/16/18 0815  GLUCAP 80     Recent Results (from the past 240 hour(s))  MRSA PCR Screening     Status: Abnormal   Collection Time: 07/14/18  1:51 AM  Result Value Ref Range Status   MRSA by PCR POSITIVE (A) NEGATIVE Final    Comment:        The GeneXpert MRSA Assay (FDA approved for NASAL  specimens only), is one component of a comprehensive MRSA colonization surveillance program. It is not intended to diagnose MRSA infection nor to guide or monitor treatment for MRSA infections. RESULT CALLED TO, READ BACK BY AND VERIFIED WITH: A HAMAD RN 0421 07/14/18 A BROWNING Performed at [facility], [place] [address] [place], [place] [address]       Radiology Studies: Dg Chest Port 1 View  Result Date: 07/15/2018 CLINICAL DATA:  Chest pain EXAM: PORTABLE CHEST 1 VIEW COMPARISON:  07/14/2018, 02/09/2015 FINDINGS: Chronic elevation of the right diaphragm with minimal atelectasis at the right base. No pleural effusion. Stable borderline enlarged cardiomediastinal silhouette. No pneumothorax. IMPRESSION: No active disease. Chronically elevated right diaphragm with subsegmental atelectasis Electronically Signed   By: Donavan Foil M.D.   On: 07/15/2018 18:23     Medications:  Scheduled: . aspirin EC  81 mg Oral Daily  . Chlorhexidine Gluconate Cloth  6 each Topical Q0600  . diltiazem  120 mg Oral BID  . heparin  5,000 Units Subcutaneous Q8H  . hydrochlorothiazide  25 mg Oral Daily  . losartan  100 mg Oral Daily  . mupirocin ointment  1 application Nasal BID  . venlafaxine XR  37.5 mg Oral Q breakfast   Continuous:  OVF:[id number], bisacodyl, hydrALAZINE, HYDROcodone-acetaminophen  Assessment/Plan:    Hypertensive urgency Due to presence of cocaine in his urine drug screen cannot use beta-blocker.  Patient was started back on his home doses of Cardizem and losartan.  Creatinine noted to be higher today compared to yesterday.  We will hold the ARB as well as diuretic.  Continue with Cardizem.  Add hydralazine on a scheduled basis.  Could consider increasing the dose of Cardizem as well.  Intermittent chest pain EKG showed nonspecific T wave changes.  Troponins have been normal.  No chest pain this morning.  This could all be due to vasospasm from cocaine use.   Apparently patient did have an echocardiogram at the other facility.  There might be benefit to repeating the study here.  We will reorder.  Continue aspirin.  If he continues to have chest discomfort could consider switching the Cardizem to amlodipine.  Repeat EKG in the morning.  Migraine headaches Patient with chronic history of same.  Appears to be in status migrainosus.  Neurology has been consulted and we appreciate their assistance.  Polysubstance abuse Patient adamantly denies using any illicit drugs.  He will need counseling.  Acute on chronic kidney disease stage III He was noted to have a creatinine of 1.54 in August 2018.  Rise in creatinine noted today.  As discussed above we will hold his hydrochlorothiazide and his ARB for now.  Monitor urine output.  He is making urine.  Recheck labs tomorrow.  DVT Prophylaxis: Subcutaneous Heparin    Code  Status: Full code Family Communication: Discussed with the patient Disposition Plan: Management as outlined above.    LOS: 2 days   [facility] Pager [phone] 07/16/2018, 10:38 AM  If 7PM-7AM, please contact night-coverage at [email or web address], password [email or web address]

## 2018-07-16 NOTE — Progress Notes (Signed)
Patient is requesting to have private conversations with MD during rounding.He does not want to talk about his treatment plan In-front of his family members. Patient is requesting letter to work from MD

## 2018-07-16 NOTE — Progress Notes (Signed)
Md Responded that at discharged patient will receive a doctors note to follow-up with a outpatient PCP FMLA forms.

## 2018-07-16 NOTE — Plan of Care (Signed)
  Problem: Education: Goal: Knowledge of General Education information will improve Description Including pain rating scale, medication(s)/side effects and non-pharmacologic comfort measures Outcome: Progressing   Problem: Coping: Goal: Level of anxiety will decrease Outcome: Progressing   Problem: Elimination: Goal: Will not experience complications related to bowel motility Outcome: Progressing   Problem: Pain Managment: Goal: General experience of comfort will improve Outcome: Progressing  Patient taking PRN pain medication for his headache.

## 2018-07-17 ENCOUNTER — Inpatient Hospital Stay (HOSPITAL_COMMUNITY): Payer: BLUE CROSS/BLUE SHIELD

## 2018-07-17 DIAGNOSIS — I5030 Unspecified diastolic (congestive) heart failure: Secondary | ICD-10-CM

## 2018-07-17 DIAGNOSIS — G43011 Migraine without aura, intractable, with status migrainosus: Secondary | ICD-10-CM

## 2018-07-17 LAB — CBC
HCT: 43 % (ref 39.0–52.0)
Hemoglobin: 13.4 g/dL (ref 13.0–17.0)
MCH: 22.9 pg — AB (ref 26.0–34.0)
MCHC: 31.2 g/dL (ref 30.0–36.0)
MCV: 73.5 fL — AB (ref 78.0–100.0)
PLATELETS: 204 10*3/uL (ref 150–400)
RBC: 5.85 MIL/uL — ABNORMAL HIGH (ref 4.22–5.81)
RDW: 14.4 % (ref 11.5–15.5)
WBC: 7.4 10*3/uL (ref 4.0–10.5)

## 2018-07-17 LAB — URINALYSIS, ROUTINE W REFLEX MICROSCOPIC
BILIRUBIN URINE: NEGATIVE
Glucose, UA: NEGATIVE mg/dL
HGB URINE DIPSTICK: NEGATIVE
Ketones, ur: NEGATIVE mg/dL
Leukocytes, UA: NEGATIVE
NITRITE: NEGATIVE
PH: 5 (ref 5.0–8.0)
Protein, ur: NEGATIVE mg/dL
SPECIFIC GRAVITY, URINE: 1.014 (ref 1.005–1.030)

## 2018-07-17 LAB — BASIC METABOLIC PANEL
Anion gap: 8 (ref 5–15)
BUN: 24 mg/dL — AB (ref 6–20)
CO2: 28 mmol/L (ref 22–32)
CREATININE: 1.75 mg/dL — AB (ref 0.61–1.24)
Calcium: 9.2 mg/dL (ref 8.9–10.3)
Chloride: 102 mmol/L (ref 98–111)
GFR calc Af Amer: 48 mL/min — ABNORMAL LOW (ref >60)
GFR, EST NON AFRICAN AMERICAN: 41 mL/min — AB (ref >60)
GLUCOSE: 90 mg/dL (ref 70–99)
Potassium: 4.1 mmol/L (ref 3.5–5.1)
SODIUM: 138 mmol/L (ref 135–145)

## 2018-07-17 LAB — ECHOCARDIOGRAM COMPLETE
Height: 72 in
WEIGHTICAEL: 3212.8 [oz_av]

## 2018-07-17 MED ORDER — DIPHENHYDRAMINE HCL 50 MG/ML IJ SOLN
25.0000 mg | Freq: Once | INTRAMUSCULAR | Status: AC
  Administered 2018-07-17: 25 mg via INTRAVENOUS
  Filled 2018-07-17: qty 1

## 2018-07-17 MED ORDER — LACTATED RINGERS IV SOLN
INTRAVENOUS | Status: DC
  Administered 2018-07-17 – 2018-07-18 (×2): via INTRAVENOUS

## 2018-07-17 MED ORDER — KETOROLAC TROMETHAMINE 30 MG/ML IJ SOLN
30.0000 mg | Freq: Once | INTRAMUSCULAR | Status: AC
  Administered 2018-07-17: 30 mg via INTRAVENOUS
  Filled 2018-07-17: qty 1

## 2018-07-17 MED ORDER — VALPROATE SODIUM 500 MG/5ML IV SOLN
1000.0000 mg | Freq: Once | INTRAVENOUS | Status: AC
  Administered 2018-07-17: 1000 mg via INTRAVENOUS
  Filled 2018-07-17: qty 10

## 2018-07-17 MED ORDER — PROCHLORPERAZINE EDISYLATE 10 MG/2ML IJ SOLN
10.0000 mg | Freq: Once | INTRAMUSCULAR | Status: DC
  Filled 2018-07-17 (×2): qty 2

## 2018-07-17 MED ORDER — ZOLPIDEM TARTRATE 5 MG PO TABS
5.0000 mg | ORAL_TABLET | Freq: Every evening | ORAL | Status: DC | PRN
  Administered 2018-07-17: 5 mg via ORAL
  Filled 2018-07-17: qty 1

## 2018-07-17 MED ORDER — AMLODIPINE BESYLATE 10 MG PO TABS
10.0000 mg | ORAL_TABLET | Freq: Every day | ORAL | Status: DC
  Administered 2018-07-17 – 2018-07-20 (×4): 10 mg via ORAL
  Filled 2018-07-17 (×4): qty 1

## 2018-07-17 MED ORDER — AMLODIPINE BESYLATE 10 MG PO TABS: 10.0000 mg | ORAL_TABLET | Freq: Every day | ORAL | Status: DC

## 2018-07-17 NOTE — Progress Notes (Signed)
  Echocardiogram 2D Echocardiogram has been performed.  [name] 07/17/2018, 11:56 AM

## 2018-07-17 NOTE — Progress Notes (Addendum)
Subjective: Currently lying in the room with the lights off looking at his phone.  Patient states that his headache is a 8/10 but does feel slightly better than yesterday.  Headache is located over the left eye and frontal region, he describes it as a stabbing sensation.  States that sound does not cause any problems at this time however he is severely photophobic.  Does admit that often times his migraines do last for a long time.  States that the migraine cocktail put him to sleep, the magnesium did not help.  So far the hydrocodone has been the only thing that has been helping him.  When asked he did state that he is not sure if he has had Depakote but is willing to try it.  Exam: Vitals:   07/17/18 0135 07/17/18 0606  BP: (!) 149/103 (!) 147/103  Pulse: 63 63  Resp:  16  Temp: 98.1 F (36.7 C) 97.8 F (36.6 C)  SpO2: 100% 100%    Physical Exam   HEENT-  Normocephalic, no lesions, without obvious abnormality.  Normal external eye and conjunctiva.   Extremities- Warm, dry and intact Musculoskeletal-no joint tenderness, deformity or swelling Skin-warm and dry, no hyperpigmentation, vitiligo, or suspicious lesions    Neuro:  Mental Status: Alert, oriented, thought content appropriate.  Speech fluent without evidence of aphasia.  Able to follow 3 step commands without difficulty. Cranial Nerves: II:  Visual fields grossly normal,  III,IV, VI: ptosis not present, extra-ocular motions intact bilaterally pupils equal, round, reactive to light and accommodation V,VII: smile symmetric, facial light touch sensation normal bilaterally VIII: hearing normal bilaterally IX,X: uvula rises midline XI: bilateral shoulder shrug XII: midline tongue extension Motor: Right : Upper extremity   5/5    Left:     Upper extremity   5/5  Lower extremity   5/5     Lower extremity   5/5 Tone and bulk:normal tone throughout; no atrophy noted Sensory: Pinprick and light touch intact throughout,  bilaterally Deep Tendon Reflexes: 2+ and symmetric throughout upper extremities, right knee jerk is 0/4 left knee jerk is 2/4 and no Achilles reflexes Plantars: Mute toes Cerebellar: normal finger-to-nose,      Medications:  Prior to Admission:  Medications Prior to Admission  Medication Sig Dispense Refill Last Dose  . ALPRAZolam (XANAX) 1 MG tablet Take one half to one qhs prn sleep 30 tablet 5 unk at prn  . diltiazem (CARDIZEM CD) 120 MG 24 hr capsule Take 1 capsule (120 mg total) by mouth 2 (two) times daily. 60 capsule 5 07/11/2018  . FLUoxetine (PROZAC) 20 MG tablet Take 2 tablets (40 mg total) by mouth daily. 30 tablet 5 07/11/2018  . losartan (COZAAR) 100 MG tablet Take 1 tablet (100 mg total) by mouth daily. 30 tablet 5 07/11/2018  . UNKNOWN TO PATIENT Inject as directed every 3 (three) months. INJECTIONS for MIGRAINES administered by Orie Rout, MD at [place], [place]   Past Week at Unknown time   Scheduled: . aspirin EC  81 mg Oral Daily  . Chlorhexidine Gluconate Cloth  6 each Topical Q0600  . diltiazem  120 mg Oral BID  . heparin  5,000 Units Subcutaneous Q8H  . hydrALAZINE  50 mg Oral Q8H  . mupirocin ointment  1 application Nasal BID  . venlafaxine XR  37.5 mg Oral Q breakfast   Continuous:   Pertinent Labs/Diagnostics:   Dg Chest Port 1 View  Result Date: 07/15/2018 CLINICAL DATA:  Chest  pain EXAM: PORTABLE CHEST 1 VIEW COMPARISON:  07/14/2018, 02/09/2015 FINDINGS: Chronic elevation of the right diaphragm with minimal atelectasis at the right base. No pleural effusion. Stable borderline enlarged cardiomediastinal silhouette. No pneumothorax. IMPRESSION: No active disease. Chronically elevated right diaphragm with subsegmental atelectasis Electronically Signed   By: Donavan Foil M.D.   On: 07/15/2018 18:23     Etta Quill PA-C [facility] [phone]   Assessment: 57 year old male with past medical history of  hypertension, migraine in the past.  Blood pressures have steadily improved however his last blood pressure is still elevated at 147/103.  I believe he is most likely suffering from a status migrainosus.  I would still like to see his blood pressure lower.    Recommendations: -compazine, Toradol, benadryl and Depakote 1 g (ordered) once -Lower blood pressure - Smoke cessation - While in the hospital although it is okay for patient to receive opiates would try to decrease the use of narcotics to avoid rebound headache.  Will discuss with Dr. Leonel Ramsay    07/17/2018, 10:48 AM

## 2018-07-17 NOTE — Plan of Care (Signed)
  Problem: Education: Goal: Knowledge of disease or condition will improve Outcome: Progressing Goal: Understanding of discharge needs will improve Outcome: Progressing   Problem: Health Behavior/Discharge Planning: Goal: Ability to identify changes in lifestyle to reduce recurrence of condition will improve Outcome: Progressing Goal: Identification of resources available to assist in meeting health care needs will improve Outcome: Progressing   Problem: Physical Regulation: Goal: Complications related to the disease process, condition or treatment will be avoided or minimized Outcome: Progressing   Problem: Safety: Goal: Ability to remain free from injury will improve Outcome: Progressing   Problem: Education: Goal: Knowledge of General Education information will improve Description Including pain rating scale, medication(s)/side effects and non-pharmacologic comfort measures Outcome: Progressing   Problem: Health Behavior/Discharge Planning: Goal: Ability to manage health-related needs will improve Outcome: Progressing   Problem: Clinical Measurements: Goal: Ability to maintain clinical measurements within normal limits will improve Outcome: Progressing Goal: Will remain free from infection Outcome: Progressing Goal: Diagnostic test results will improve Outcome: Progressing Goal: Respiratory complications will improve Outcome: Progressing   Problem: Activity: Goal: Risk for activity intolerance will decrease Outcome: Progressing   Problem: Nutrition: Goal: Adequate nutrition will be maintained Outcome: Progressing   Problem: Coping: Goal: Level of anxiety will decrease Outcome: Progressing   Problem: Elimination: Goal: Will not experience complications related to bowel motility Outcome: Progressing Goal: Will not experience complications related to urinary retention Outcome: Progressing   Problem: Safety: Goal: Ability to remain free from injury will  improve Outcome: Progressing   Problem: Skin Integrity: Goal: Risk for impaired skin integrity will decrease Outcome: Progressing

## 2018-07-17 NOTE — Progress Notes (Signed)
PROGRESS NOTE    [name]  NWG:[id number] DOB: [birth year] DOA: 07/14/2018 PCP: Mikey Kirschner, MD    Brief Narrative:  57 year old African-American male with [name] past medical history of essential hypertension, tobacco abuse, migraine headaches presented to the hospital in [place] with complains of right knee pain and was found to have systolic blood pressure greater than 200.  He also mentioned intermittent chest pain as well as severe migraine headaches.  Evaluation there was positive for [name] creatinine of 1.74.  Urine drug screen was positive for cocaine and marijuana.  CT head without contrast was negative.  CT angiogram of the chest was negative for PE.  Lower extremity Doppler studies were negative for DVT.  Echocardiogram showed normal systolic function with grade 1 diastolic dysfunction.  EF was 55 to 60%.  Patient was subsequently transferred here at the request of family.  Neurology was consulted.  Patient was initially admitted to the intensive care unit. Now transferred to floor.   Assessment & Plan:   Active Problems:   Hypertensive emergency   Hypertensive urgency Due to presence of cocaine in his urine drug screen recommend against beta-blocker. Currently diuretic and ARB on hold as creatinine increased to 1.75, but could consider restarting these gradually and following creatinine closely as doesn't appear too far from baseline Continue hydralazine 50 q8.  D/c diltiazem and start amlodipine 10 mg daily.  Intermittent chest pain EKG showed nonspecific T wave changes.  Troponins have been normal.   He notes continued chest pain that is pleuritic in description this morning Could be due to vasospasm from cocaine use.   CXR from 9/28 with subsegmental atelectasis Repeat echo here pending Negative CTA at OSH Repeat EKG on 9/30 appears similar to prior Continue ASA   Migraine headaches Patient with chronic history of same.   Appears to be in status  migrainosus.   Neurology has been consulted and we appreciate their assistance (migraine cocktail per neurology, appreciate assistance)  Polysubstance abuse Patient adamantly denies using any illicit drugs.  He will need counseling (social work ordered)  Acute on chronic kidney disease stage III He was noted to have [name] creatinine of 1.54 in August 2018.   Rise in creatinine noted to ~1.7 today.   Holding HCTZ and ARB for now, continue to monitor if restarting these Check urinalysis  DVT prophylaxis: heparin Code Status: full code Family Communication: none at bedside Disposition Plan: pending improvement in BP and neurology sign off   Consultants:   Neurology  PCCM transfer  Procedures:  Echo, pending  Antimicrobials:  Anti-infectives (From admission, onward)   None         Subjective: HA improved today, but still present. Still some residual CP.  Present since admission.  Worse with deep breaths. Needs [name] PCP.  Objective: Vitals:   07/16/18 1932 07/17/18 0135 07/17/18 0606 07/17/18 0614  BP: (!) 150/110 (!) 149/103 (!) 147/103   Pulse: 69 63 63   Resp: 18  16   Temp: 98.4 F (36.9 C) 98.1 F (36.7 C) 97.8 F (36.6 C)   TempSrc: Oral Oral Oral   SpO2: 100% 100% 100%   Weight:    91.1 kg  Height:        Intake/Output Summary (Last 24 hours) at 07/17/2018 1334 Last data filed at 07/17/2018 0600 Gross per 24 hour  Intake 640 ml  Output 500 ml  Net 140 ml   Filed Weights   07/15/18 1727 07/16/18 0500 07/17/18 5784  Weight: 92 kg 91.7 kg 91.1 kg    Examination:  General exam: Appears calm and comfortable  Respiratory system: Clear to auscultation. Respiratory effort normal. Cardiovascular system: S1 & S2 heard, RRR.  Gastrointestinal system: Abdomen is nondistended, soft and nontender. Central nervous system: Alert and oriented. No focal neurological deficits. Extremities: Symmetric 5 x 5 power. Skin: No rashes, lesions or ulcers Psychiatry:  Judgement and insight appear normal. Mood & affect appropriate.     Data Reviewed: I have personally reviewed following labs and imaging studies  CBC: Recent Labs  Lab 07/14/18 0223 07/17/18 0545  WBC 8.3 7.4  NEUTROABS 6.2  --   HGB 14.0 13.4  HCT 43.8 43.0  MCV 72.4* 73.5*  PLT 192 235   Basic Metabolic Panel: Recent Labs  Lab 07/14/18 0223 07/16/18 0504 07/17/18 0545  NA 137 138 138  K 4.0 4.8 4.1  CL 103 105 102  CO2 23 24 28   GLUCOSE 105* 90 90  BUN 13 31* 24*  CREATININE 1.39* 1.78* 1.75*  CALCIUM 9.7 8.9 9.2  MG 2.0  --   --   PHOS 4.5  --   --    GFR: Estimated Creatinine Clearance: 50.5 mL/min ([name]) (by C-G formula based on SCr of 1.75 mg/dL (H)). Liver Function Tests: Recent Labs  Lab 07/14/18 0223  AST 15  ALT 15  ALKPHOS 86  BILITOT 0.7  PROT 7.8  ALBUMIN 4.0   No results for input(s): LIPASE, AMYLASE in the last 168 hours. No results for input(s): AMMONIA in the last 168 hours. Coagulation Profile: Recent Labs  Lab 07/14/18 0223  INR 1.09   Cardiac Enzymes: Recent Labs  Lab 07/14/18 0223 07/14/18 0719 07/14/18 1340 07/15/18 2017  TROPONINI <0.03 <0.03 <0.03 <0.03   BNP (last 3 results) No results for input(s): PROBNP in the last 8760 hours. HbA1C: No results for input(s): HGBA1C in the last 72 hours. CBG: Recent Labs  Lab 07/16/18 0815  GLUCAP 80   Lipid Profile: No results for input(s): CHOL, HDL, LDLCALC, TRIG, CHOLHDL, LDLDIRECT in the last 72 hours. Thyroid Function Tests: No results for input(s): TSH, T4TOTAL, FREET4, T3FREE, THYROIDAB in the last 72 hours. Anemia Panel: No results for input(s): VITAMINB12, FOLATE, FERRITIN, TIBC, IRON, RETICCTPCT in the last 72 hours. Sepsis Labs: Recent Labs  Lab 07/14/18 0223 07/14/18 0719  LATICACIDVEN 0.9 0.9    Recent Results (from the past 240 hour(s))  MRSA PCR Screening     Status: Abnormal   Collection Time: 07/14/18  1:51 AM  Result Value Ref Range Status   MRSA by  PCR POSITIVE ([name]) NEGATIVE Final    Comment:        The GeneXpert MRSA Assay (FDA approved for NASAL specimens only), is one component of [name] comprehensive MRSA colonization surveillance program. It is not intended to diagnose MRSA infection nor to guide or monitor treatment for MRSA infections. RESULT CALLED TO, READ BACK BY AND VERIFIED WITH: Marguarite Markov HAMAD RN 0421 07/14/18 Manav Pierotti BROWNING Performed at [facility], [place] [address], [place], [place] [address]          Radiology Studies: Dg Chest Port 1 View  Result Date: 07/15/2018 CLINICAL DATA:  Chest pain EXAM: PORTABLE CHEST 1 VIEW COMPARISON:  07/14/2018, 02/09/2015 FINDINGS: Chronic elevation of the right diaphragm with minimal atelectasis at the right base. No pleural effusion. Stable borderline enlarged cardiomediastinal silhouette. No pneumothorax. IMPRESSION: No active disease. Chronically elevated right diaphragm with subsegmental atelectasis Electronically Signed  By: Donavan Foil M.D.   On: 07/15/2018 18:23        Scheduled Meds: . amLODipine  10 mg Oral Daily  . aspirin EC  81 mg Oral Daily  . Chlorhexidine Gluconate Cloth  6 each Topical Q0600  . prochlorperazine  10 mg Intravenous Once   And  . ketorolac  30 mg Intravenous Once   And  . diphenhydrAMINE  25 mg Intravenous Once  . heparin  5,000 Units Subcutaneous Q8H  . hydrALAZINE  50 mg Oral Q8H  . mupirocin ointment  1 application Nasal BID  . venlafaxine XR  37.5 mg Oral Q breakfast   Continuous Infusions: . lactated ringers    . valproate sodium       LOS: 3 days    Time spent: over 30 min    Fayrene Helper, MD [facility] Pager [phone]  If 7PM-7AM, please contact night-coverage [email or web address] Password [email or web address] 07/17/2018, 1:34 PM

## 2018-07-18 ENCOUNTER — Inpatient Hospital Stay (HOSPITAL_COMMUNITY): Payer: BLUE CROSS/BLUE SHIELD

## 2018-07-18 DIAGNOSIS — R51 Headache: Secondary | ICD-10-CM

## 2018-07-18 LAB — CBC
HCT: 42.3 % (ref 39.0–52.0)
HEMOGLOBIN: 13.4 g/dL (ref 13.0–17.0)
MCH: 23 pg — ABNORMAL LOW (ref 26.0–34.0)
MCHC: 31.7 g/dL (ref 30.0–36.0)
MCV: 72.6 fL — ABNORMAL LOW (ref 78.0–100.0)
PLATELETS: 195 10*3/uL (ref 150–400)
RBC: 5.83 MIL/uL — ABNORMAL HIGH (ref 4.22–5.81)
RDW: 14.1 % (ref 11.5–15.5)
WBC: 8.2 10*3/uL (ref 4.0–10.5)

## 2018-07-18 LAB — BASIC METABOLIC PANEL
Anion gap: 4 — ABNORMAL LOW (ref 5–15)
BUN: 23 mg/dL — ABNORMAL HIGH (ref 6–20)
CALCIUM: 8.8 mg/dL — AB (ref 8.9–10.3)
CHLORIDE: 102 mmol/L (ref 98–111)
CO2: 29 mmol/L (ref 22–32)
CREATININE: 1.64 mg/dL — AB (ref 0.61–1.24)
GFR, EST AFRICAN AMERICAN: 52 mL/min — AB (ref >60)
GFR, EST NON AFRICAN AMERICAN: 45 mL/min — AB (ref >60)
Glucose, Bld: 91 mg/dL (ref 70–99)
Potassium: 3.7 mmol/L (ref 3.5–5.1)
SODIUM: 135 mmol/L (ref 135–145)

## 2018-07-18 LAB — MAGNESIUM: MAGNESIUM: 2 mg/dL (ref 1.7–2.4)

## 2018-07-18 MED ORDER — PROCHLORPERAZINE MALEATE 10 MG PO TABS
10.0000 mg | ORAL_TABLET | Freq: Four times a day (QID) | ORAL | Status: DC
  Administered 2018-07-18 – 2018-07-19 (×4): 10 mg via ORAL
  Filled 2018-07-18 (×5): qty 1

## 2018-07-18 MED ORDER — HYDROCHLOROTHIAZIDE 12.5 MG PO CAPS
12.5000 mg | ORAL_CAPSULE | Freq: Every day | ORAL | Status: DC
  Administered 2018-07-18 – 2018-07-20 (×3): 12.5 mg via ORAL
  Filled 2018-07-18 (×3): qty 1

## 2018-07-18 MED ORDER — DIPHENHYDRAMINE HCL 12.5 MG/5ML PO ELIX
2.5000 mg | ORAL_SOLUTION | Freq: Two times a day (BID) | ORAL | Status: DC
  Administered 2018-07-18 (×2): 2.5 mg via ORAL
  Filled 2018-07-18 (×3): qty 5

## 2018-07-18 NOTE — Progress Notes (Signed)
Subjective: Patient still complaining out of an 8/10 headache that is located over the right eye.  He describes as throbbing.  He states that the migraine cocktail did not help yesterday and the only thing that seems to bring the headache reasonable is the Vicodin.  The Vicodin has been stopped per patient's primary.  Patient states that photophobia is still causing him severe pain however it should be noted that he is looking at his phone with full brightness.  Exam: Vitals:   07/18/18 0522 07/18/18 1004  BP: (!) 154/103 (!) 193/110  Pulse: 70   Resp: 16   Temp: 98.1 F (36.7 C) 98.2 F (36.8 C)  SpO2: 99% 98%    Physical Exam   HEENT-  Normocephalic, no lesions, without obvious abnormality.  Normal external eye and conjunctiva.   Extremities- Warm, dry and intact Musculoskeletal-no joint tenderness, deformity or swelling Skin-warm and dry, no hyperpigmentation, vitiligo, or suspicious lesions    Neuro:  Mental Status: Alert, oriented, thought content appropriate.  Speech fluent without evidence of aphasia.  Able to follow 3 step commands without difficulty. Cranial Nerves: II:  Visual fields grossly normal,  III,IV, VI: ptosis not present, extra-ocular motions intact bilaterally pupils equal, round, reactive to light and accommodation V,VII: smile symmetric, facial light touch sensation normal bilaterally VIII: hearing normal bilaterally IX,X: uvula rises midline XI: bilateral shoulder shrug XII: midline tongue extension Motor: Right : Upper extremity   5/5    Left:     Upper extremity   5/5  Lower extremity   5/5     Lower extremity   5/5 Tone and bulk:normal tone throughout; no atrophy noted Sensory: Pinprick and light touch intact throughout, bilaterally Deep Tendon Reflexes: 2+ and symmetric throughout Plantars: Right: downgoing   Left: downgoing Cerebellar: normal finger-to-nose, normal rapid alternating movements and normal heel-to-shin test     Medications:   Scheduled: . amLODipine  10 mg Oral Daily  . aspirin EC  81 mg Oral Daily  . Chlorhexidine Gluconate Cloth  6 each Topical Q0600  . diphenhydrAMINE  2.5 mg Oral Q12H  . heparin  5,000 Units Subcutaneous Q8H  . hydrALAZINE  50 mg Oral Q8H  . hydrochlorothiazide  12.5 mg Oral Daily  . prochlorperazine  10 mg Intravenous Once  . prochlorperazine  10 mg Oral Q6H  . venlafaxine XR  37.5 mg Oral Q breakfast   Continuous: . lactated ringers 100 mL/hr at 07/18/18 0538    Pertinent Labs/Diagnostics: -BUN 23 -Creatinine 1.64 -Calcium 8.8 - Blood pressure 193/110     Etta Quill PA-C [facility] [phone]   Assessment: 57 year old male with past medical history of hypertension, migraine in the past.  Patient states that most of his headaches last for approximately 3 to 7 days.  Patient still suffering from elevated blood pressure and throbbing headache over his right eye.  No medications other than the hydrocodone seem to help the patient.  Yesterday's migraine cocktail was not beneficial.    Recommendations: - Unfortunately we cannot give him DHE as this is contraindicated in uncontrolled hypertension -Continue lowering blood pressure - Recommend Compazine 10 mg every 6 hours along with Benadryl 2.5 mg every 12 hours - It is noted that patient does not have any headache prophylaxis.  Patient should likely start this as an outpatient as he has headaches multiple times during a week.    07/18/2018, 11:34 AM

## 2018-07-18 NOTE — Progress Notes (Signed)
RN heard pt bed alarm and went to see what was going on, and found pt on the floor. During assessment numerous nurses ask pt about pain, but only pain he had was headache which was pt complain prior to fall. When asked what happen that led to fall patient stated he was trying to stand up to pee in urinal, but don't know what happen after that.  MD paged, came to see patient  And family arrived few minutes after fall and RN talk to her per pt permission.

## 2018-07-18 NOTE — Plan of Care (Signed)
  Problem: Education: Goal: Knowledge of disease or condition will improve Outcome: Progressing Goal: Understanding of discharge needs will improve Outcome: Progressing   Problem: Physical Regulation: Goal: Complications related to the disease process, condition or treatment will be avoided or minimized Outcome: Progressing   Problem: Clinical Measurements: Goal: Ability to maintain clinical measurements within normal limits will improve Outcome: Progressing Goal: Will remain free from infection Outcome: Progressing Goal: Respiratory complications will improve Outcome: Progressing Goal: Cardiovascular complication will be avoided Outcome: Progressing   Problem: Activity: Goal: Risk for activity intolerance will decrease Outcome: Progressing   Problem: Nutrition: Goal: Adequate nutrition will be maintained Outcome: Progressing   Problem: Coping: Goal: Level of anxiety will decrease Outcome: Progressing   Problem: Elimination: Goal: Will not experience complications related to bowel motility Outcome: Progressing Goal: Will not experience complications related to urinary retention Outcome: Progressing   Problem: Skin Integrity: Goal: Risk for impaired skin integrity will decrease Outcome: Progressing

## 2018-07-18 NOTE — Progress Notes (Signed)
   07/18/18 1235  What Happened  Was fall witnessed? No  Was patient injured? Unsure  Patient found on floor  Found by Staff-comment  Stated prior activity bathroom-unassisted  Follow Up  MD notified  Florene Glen)  Time MD notified [phone]  Additional tests Yes-comment (CT of head, 12 lead EKG)  Adult Fall Risk Assessment  Risk Factor Category (scoring not indicated) High fall risk per protocol (document High fall risk);Fall has occurred during this admission (document High fall risk)  Age 57  Fall History: Fall within 6 months prior to admission 5  Elimination; Bowel and/or Urine Incontinence 0  Elimination; Bowel and/or Urine Urgency/Frequency 0  Patient Care Equipment 1  Mobility-Assistance 2  Mobility-Gait 2  Patient Fall Risk Level High fall risk  Adult Fall Risk Interventions  Required Bundle Interventions *See Row Information* High fall risk - low, moderate, and high requirements implemented  Additional Interventions Use of appropriate toileting equipment (bedpan, BSC, etc.)  Screening for Fall Injury Risk (To be completed on HIGH fall risk patients) - Assessing Need for Low Bed  Risk For Fall Injury- Low Bed Criteria None identified - Continue screening  Screening for Fall Injury Risk (To be completed on HIGH fall risk patients who do not meet crieteria for Low Bed) - Assessing Need for Floor Mats Only  Risk For Fall Injury- Criteria for Floor Mats None identified - No additional interventions needed  Vitals  Temp 98.7 F (37.1 C)  Temp Source Oral  BP (!) 145/96  MAP (mmHg) 110  BP Method Automatic  Pulse Rate (!) 110  Pulse Rate Source Monitor  Resp 18  Oxygen Therapy  SpO2 100 %  O2 Device Nasal Cannula  O2 Flow Rate (L/min) 2 L/min

## 2018-07-18 NOTE — Progress Notes (Signed)
PROGRESS NOTE    [name]  TFT:[id number] DOB: 1961/08/31 DOA: 07/14/2018 PCP: Mikey Kirschner, MD    Brief Narrative:  57 year old African-American male with a past medical history of essential hypertension, tobacco abuse, migraine headaches presented to the hospital in [place] with complains of right knee pain and was found to have systolic blood pressure greater than 200.  He also mentioned intermittent chest pain as well as severe migraine headaches.  Evaluation there was positive for a creatinine of 1.74.  Urine drug screen was positive for cocaine and marijuana.  CT head without contrast was negative.  CT angiogram of the chest was negative for PE.  Lower extremity Doppler studies were negative for DVT.  Echocardiogram showed normal systolic function with grade 1 diastolic dysfunction.  EF was 55 to 60%.  Patient was subsequently transferred here at the request of family.  Neurology was consulted.  Patient was initially admitted to the intensive care unit. Now transferred to floor.   Assessment & Plan:   Active Problems:   Hypertensive emergency   Hypertensive urgency Due to presence of cocaine in his urine drug screen recommend against beta-blocker. Currently diuretic and ARB on hold as creatinine increased to 1.78, but now appears to be improving, will restart HCTZ SBP elevated to 706'C systolic this AM  Continue hydralazine 50 q8.  D/c diltiazem and start amlodipine 10 mg daily.  Will add HCTZ 12.5 mg daily (follow creatinine closely).  Intermittent chest pain Unclear etiology EKG showed nonspecific T wave changes.  Troponins have been normal.   He notes continued chest pain that is pleuritic in description Could be due to vasospasm from cocaine use.   CXR from 9/28 with subsegmental atelectasis Repeat echo here with grade 1 diastolic dysfunction Negative CTA at OSH Repeat EKG on 9/30 appears similar to prior Continue ASA   Migraine headaches Patient with  chronic history of same.   Appears to be in status migrainosus.   Neurology has been consulted and we appreciate their assistance (migraine cocktail per neurology, appreciate assistance) Will stop norco given risk of rebound headaches with opiates.   Discussed with neurology and recommending benadryl 2.5 mg q12 and compazine 10 mg q6 hrs.  Also recommending prophylaxis as an outpatient.   Fall: Pt had fall from edge of bed today.  States he was sitting on edge of bed and next thing he knew he was on the ground.  Possible LOC, but pt does not describe this well.  Not clear if any head trauma.  No chest pain.  C/o chronic unchanged HA pain.       Follow head CT EKG Orthostatics Will continue tele  Polysubstance abuse Patient denies using any illicit drugs.  He will need counseling (social work ordered)  Acute on chronic kidney disease stage III He was noted to have a creatinine of 1.54 in August 2018.   Rise in creatinine noted to ~1.78 improved to 1.64 (baseline appears to be ~1.5).   Holding arb.  Restarting HCTZ. UA bland  DVT prophylaxis: heparin Code Status: full code Family Communication: sister and friend at bedside Disposition Plan: pending improvement in BP and neurology sign off   Consultants:   Neurology  PCCM transfer  Procedures:  Study Conclusions  - Left ventricle: The cavity size was normal. Wall thickness was   increased in a pattern of mild LVH. Systolic function was normal.   The estimated ejection fraction was in the range of 50% to 55%.  Doppler parameters are consistent with abnormal left ventricular   relaxation (grade 1 diastolic dysfunction).  Antimicrobials:  Anti-infectives (From admission, onward)   None         Subjective: HA persistent.  He notes these are similar to HA he has at home. He's frustrated with back and forth between teams.  Objective: Vitals:   07/17/18 1954 07/17/18 2054 07/18/18 0522 07/18/18 1004  BP: (!) 173/102  (!) 155/103 (!) 154/103 (!) 193/110  Pulse: 76 67 70   Resp: 20  16   Temp: 98.6 F (37 C)  98.1 F (36.7 C) 98.2 F (36.8 C)  TempSrc: Oral  Oral Oral  SpO2: 100%  99% 98%  Weight:   89.4 kg   Height:        Intake/Output Summary (Last 24 hours) at 07/18/2018 1113 Last data filed at 07/18/2018 1015 Gross per 24 hour  Intake 1553.3 ml  Output 2050 ml  Net -496.7 ml   Filed Weights   07/16/18 0500 07/17/18 0614 07/18/18 0522  Weight: 91.7 kg 91.1 kg 89.4 kg    Examination:  General: No acute distress. HEENT: [place]/AT Cardiovascular: Heart sounds show a regular rate, and rhythm.  Lungs: Clear to auscultation bilaterally  Abdomen: Soft, nontender, nondistended Neurological: Alert and oriented 3. Moves all extremities 4. Cranial nerves II through XII intact. MSK: no midline ttp to spine.  No ttp to upper or lower extremities.  Stable pelvis. Skin: Warm and dry. No rashes or lesions. Extremities: No clubbing or cyanosis. No edema.  Psychiatric: Mood and affect are normal. Insight and judgment are appropraite.   Data Reviewed: I have personally reviewed following labs and imaging studies  CBC: Recent Labs  Lab 07/14/18 0223 07/17/18 0545 07/18/18 0440  WBC 8.3 7.4 8.2  NEUTROABS 6.2  --   --   HGB 14.0 13.4 13.4  HCT 43.8 43.0 42.3  MCV 72.4* 73.5* 72.6*  PLT 192 204 725   Basic Metabolic Panel: Recent Labs  Lab 07/14/18 0223 07/16/18 0504 07/17/18 0545 07/18/18 0440  NA 137 138 138 135  K 4.0 4.8 4.1 3.7  CL 103 105 102 102  CO2 23 24 28 29   GLUCOSE 105* 90 90 91  BUN 13 31* 24* 23*  CREATININE 1.39* 1.78* 1.75* 1.64*  CALCIUM 9.7 8.9 9.2 8.8*  MG 2.0  --   --  2.0  PHOS 4.5  --   --   --    GFR: Estimated Creatinine Clearance: 53.9 mL/min (A) (by C-G formula based on SCr of 1.64 mg/dL (H)). Liver Function Tests: Recent Labs  Lab 07/14/18 0223  AST 15  ALT 15  ALKPHOS 86  BILITOT 0.7  PROT 7.8  ALBUMIN 4.0   No results for input(s): LIPASE,  AMYLASE in the last 168 hours. No results for input(s): AMMONIA in the last 168 hours. Coagulation Profile: Recent Labs  Lab 07/14/18 0223  INR 1.09   Cardiac Enzymes: Recent Labs  Lab 07/14/18 0223 07/14/18 0719 07/14/18 1340 07/15/18 2017  TROPONINI <0.03 <0.03 <0.03 <0.03   BNP (last 3 results) No results for input(s): PROBNP in the last 8760 hours. HbA1C: No results for input(s): HGBA1C in the last 72 hours. CBG: Recent Labs  Lab 07/16/18 0815  GLUCAP 80   Lipid Profile: No results for input(s): CHOL, HDL, LDLCALC, TRIG, CHOLHDL, LDLDIRECT in the last 72 hours. Thyroid Function Tests: No results for input(s): TSH, T4TOTAL, FREET4, T3FREE, THYROIDAB in the last 72 hours. Anemia Panel: No  results for input(s): VITAMINB12, FOLATE, FERRITIN, TIBC, IRON, RETICCTPCT in the last 72 hours. Sepsis Labs: Recent Labs  Lab 07/14/18 0223 07/14/18 0719  LATICACIDVEN 0.9 0.9    Recent Results (from the past 240 hour(s))  MRSA PCR Screening     Status: Abnormal   Collection Time: 07/14/18  1:51 AM  Result Value Ref Range Status   MRSA by PCR POSITIVE (A) NEGATIVE Final    Comment:        The GeneXpert MRSA Assay (FDA approved for NASAL specimens only), is one component of a comprehensive MRSA colonization surveillance program. It is not intended to diagnose MRSA infection nor to guide or monitor treatment for MRSA infections. RESULT CALLED TO, READ BACK BY AND VERIFIED WITH: A HAMAD RN 0421 07/14/18 A BROWNING Performed at [facility], [place] [address], [place], [place] [address]          Radiology Studies: No results found.      Scheduled Meds: . amLODipine  10 mg Oral Daily  . aspirin EC  81 mg Oral Daily  . Chlorhexidine Gluconate Cloth  6 each Topical Q0600  . heparin  5,000 Units Subcutaneous Q8H  . hydrALAZINE  50 mg Oral Q8H  . hydrochlorothiazide  12.5 mg Oral Daily  . prochlorperazine  10 mg Intravenous Once  . venlafaxine XR  37.5 mg  Oral Q breakfast   Continuous Infusions: . lactated ringers 100 mL/hr at 07/18/18 0538     LOS: 4 days    Time spent: over 30 min    Fayrene Helper, MD [facility] Pager [phone]  If 7PM-7AM, please contact night-coverage [email or web address] Password [email or web address] 07/18/2018, 11:13 AM

## 2018-07-18 NOTE — Plan of Care (Signed)
  Problem: Education: Goal: Knowledge of disease or condition will improve Outcome: Progressing   Problem: Physical Regulation: Goal: Complications related to the disease process, condition or treatment will be avoided or minimized Outcome: Progressing   Problem: Safety: Goal: Ability to remain free from injury will improve Outcome: Progressing   Problem: Education: Goal: Knowledge of General Education information will improve Description Including pain rating scale, medication(s)/side effects and non-pharmacologic comfort measures Outcome: Progressing   Problem: Health Behavior/Discharge Planning: Goal: Ability to manage health-related needs will improve Outcome: Progressing   Problem: Clinical Measurements: Goal: Ability to maintain clinical measurements within normal limits will improve Outcome: Progressing Goal: Will remain free from infection Outcome: Progressing Goal: Diagnostic test results will improve Outcome: Progressing Goal: Respiratory complications will improve Outcome: Progressing Goal: Cardiovascular complication will be avoided Outcome: Progressing   Problem: Activity: Goal: Risk for activity intolerance will decrease Outcome: Progressing   Problem: Nutrition: Goal: Adequate nutrition will be maintained Outcome: Progressing   Problem: Coping: Goal: Level of anxiety will decrease Outcome: Progressing   Problem: Elimination: Goal: Will not experience complications related to bowel motility Outcome: Not Progressing (no BM since 9/25-gave patient PRN dulcolax) Goal: Will not experience complications related to urinary retention Outcome: Progressing   Problem: Pain Managment: Goal: General experience of comfort will improve Outcome: Not Progressing (pt states that the pain management has not been effective-TRH oncall paged and no new orders given)   Problem: Safety: Goal: Ability to remain free from injury will improve Outcome: Progressing    Problem: Skin Integrity: Goal: Risk for impaired skin integrity will decrease Outcome: Progressing

## 2018-07-18 NOTE — Progress Notes (Signed)
Patient states that his pain is not being controlled well with Norco PO q4h.  Patient was offered Tylenol for breakthrough pain but he refused.  [facility] paged early in the night to be made aware.  Patient was able to rest for 2-3 hours throughout the night after given a PRN order for 5mg  Ambien.  Patient still states this morning that his pain is staying between 6 and 8 after PO Norco given.

## 2018-07-19 DIAGNOSIS — G4459 Other complicated headache syndrome: Secondary | ICD-10-CM

## 2018-07-19 DIAGNOSIS — R072 Precordial pain: Secondary | ICD-10-CM

## 2018-07-19 LAB — CBC
HEMATOCRIT: 45.9 % (ref 39.0–52.0)
HEMOGLOBIN: 14.8 g/dL (ref 13.0–17.0)
MCH: 23 pg — ABNORMAL LOW (ref 26.0–34.0)
MCHC: 32.2 g/dL (ref 30.0–36.0)
MCV: 71.4 fL — ABNORMAL LOW (ref 78.0–100.0)
Platelets: 230 10*3/uL (ref 150–400)
RBC: 6.43 MIL/uL — AB (ref 4.22–5.81)
RDW: 14.4 % (ref 11.5–15.5)
WBC: 13.2 10*3/uL — ABNORMAL HIGH (ref 4.0–10.5)

## 2018-07-19 LAB — BASIC METABOLIC PANEL
ANION GAP: 10 (ref 5–15)
BUN: 21 mg/dL — ABNORMAL HIGH (ref 6–20)
CHLORIDE: 101 mmol/L (ref 98–111)
CO2: 23 mmol/L (ref 22–32)
CREATININE: 1.67 mg/dL — AB (ref 0.61–1.24)
Calcium: 9.5 mg/dL (ref 8.9–10.3)
GFR calc non Af Amer: 44 mL/min — ABNORMAL LOW (ref >60)
GFR, EST AFRICAN AMERICAN: 51 mL/min — AB (ref >60)
Glucose, Bld: 122 mg/dL — ABNORMAL HIGH (ref 70–99)
Potassium: 4 mmol/L (ref 3.5–5.1)
SODIUM: 134 mmol/L — AB (ref 135–145)

## 2018-07-19 LAB — MAGNESIUM: MAGNESIUM: 2.2 mg/dL (ref 1.7–2.4)

## 2018-07-19 NOTE — Progress Notes (Deleted)
Subjective: Subjective says he feels much better today.  He thinks is due to the fact that he had a bowel movement.  He states that his headache has improved significantly.  It has gone from an 8/10 to a 7/10.  He is resting comfortably in bed.  He also states that his headache has improved secondary to lowering his blood pressure  Exam: Vitals:   07/18/18 2146 07/19/18 0617  BP: (!) 141/92 (!) 142/94  Pulse:  92  Resp:  (!) 2  Temp:  98.5 F (36.9 C)  SpO2:  98%    Physical Exam   HEENT-  Normocephalic, no lesions, without obvious abnormality.  Normal external eye and conjunctiva.   Extremities- Warm, dry and intact Musculoskeletal-no joint tenderness, deformity or swelling Skin-warm and dry, no hyperpigmentation, vitiligo, or suspicious lesions    Neuro:  Mental Status: Alert, oriented, thought content appropriate.  Speech fluent without evidence of aphasia.  Able to follow 3 step commands without difficulty. Cranial Nerves: II:  Visual fields grossly normal,  III,IV, VI: ptosis not present, extra-ocular motions intact bilaterally pupils equal, round, reactive to light and accommodation V,VII: smile symmetric, facial light touch sensation normal bilaterally VIII: hearing normal bilaterally IX,X: uvula rises midline XI: bilateral shoulder shrug XII: midline tongue extension Motor: Right : Upper extremity   5/5    Left:     Upper extremity   5/5  Lower extremity   5/5     Lower extremity   5/5 Tone and bulk:normal tone throughout; no atrophy noted Sensory: Pinprick and light touch intact throughout, bilaterally Deep Tendon Reflexes: 2+ and symmetric throughout Plantars: Right: downgoing   Left: downgoing Cerebellar: normal finger-to-nose, and normal heel-to-shin test     Medications:  Scheduled: . amLODipine  10 mg Oral Daily  . aspirin EC  81 mg Oral Daily  . heparin  5,000 Units Subcutaneous Q8H  . hydrALAZINE  50 mg Oral Q8H  . hydrochlorothiazide  12.5 mg Oral  Daily  . prochlorperazine  10 mg Intravenous Once  . venlafaxine XR  37.5 mg Oral Q breakfast      Pertinent Labs/Diagnostics: -Glucose 122 -BUN 21 -Creatinine 167.  Blood pressure has improved significantly since last night at [place].  Last 3 blood pressures have been 126/96, 141/92, 142/94.     Etta Quill PA-C [facility] [phone]   Assessment:  57 year old male with past medical history of hypertension, migraine headaches in the past which can last up to 3 to 7 days.  Upon arrival in the ED patient was suffering from significantly elevated blood pressure along with throbbing headache.  Patient has received multiple abortive medications including magnesium, Depakote, Compazine, Toradol, Benadryl, Fioricet, Decadron, morphine, Phenergan, Imitrex.,  All of which did not seem to abort his headache.  At this point in time patient's blood pressure was lowered to 140s/90s and his headache has seemed to improve significantly.   Impression: Migraine headache   Recommendations: -Continue blood pressure control -Follow-up with headache specialist as an outpatient    07/19/2018, 10:49 AM

## 2018-07-19 NOTE — Progress Notes (Signed)
PROGRESS NOTE   [name]  RDE:[id number]    DOB: 13-Mar-1961    DOA: 07/14/2018  PCP: Mikey Kirschner, MD   I have briefly reviewed patients previous medical records in [facility]  Brief Narrative:  57 year old male with PMH of HTN, migraines, polysubstance abuse (tobacco, cocaine and THC) transferred from [facility] in [place], [place] at family's request for ongoing care of hypertensive emergency, chest pain, right knee pain and refractory migraine.  He was admitted to OSH 9/25 and transferred to [facility] on 9/27.  Presenting SBP >200 mmHg.  He had reported intermittent chest pain for the week prior to admission and ongoing severe migraine.  Admitted by CCM for hypertensive emergency, migraine and acute kidney injury.  After stabilization, patient's care was transferred to [facility].  Neurology and cardiology were consulted.   Assessment & Plan:   Active Problems:   Hypertensive emergency   Hypertensive emergency: Initially admitted to ICU under CCM care.  Antihypertensives were titrated during this admission and blood pressures have significantly improved as noted below.  Continue amlodipine 10 mg daily, hydralazine 50 mg 3 times daily and HCTZ 12.5 mg daily.  Losartan on hold due to acute on chronic kidney disease.  Cardizem was discontinued.  Atypical chest pain: Patient reports intermittent left-sided chest pain which is pleuritic nature to it but also reports radiating to left upper extremity.  Troponin cycled and negative.  EKG without acute findings.  As per H&P, CTA chest and right lower extremity venous Dopplers 9/25 at OSH: Negative for PE or DVT.  TTE at OSH 9/25: LVEF 55-60%.  Cardiology was consulted and suspect musculoskeletal chest pain, recommend controlling blood pressure as we are doing, avoid cocaine and no further cardiac testing recommended.  Polysubstance abuse (tobacco, UDS positive for cocaine and THC): Patient adamantly denies illicit drug use.  Continue to encourage  cessation.  Migraine headache: Neurology was consulted.  In ED, patient had hypertensive urgency with throbbing headache.  He received multiple abortive medications including magnesium, Depakote, Compazine, Toradol, Fioricet, Benadryl, Decadron, morphine, Phenergan and Imitrex all of which did not abort his headache.  However with controlling his blood pressures, blood pressures have improved.  Neurology recommends continuing to control blood pressures, outpatient follow-up with headache specialist and neurology signed off 10/2.  CT head 9/25 at OSH: Nonacute study, MRI brain 9/26 at OSH: Small vessel ischemic changes.  No acute abnormalities.  Opioids discontinued.  Improving.  Acute on stage III chronic kidney disease: Noted to have creatinine of 1.54 in August 2018.  Creatinine on 9/27 was 1.39 but increased to 1.78 and has stabilized in the 1.6 range for the last 2 days.  ARB on hold.  HCTZ resumed.  Follow BMP closely.  Avoid nephrotoxic's.  Fall: Patient apparently fell from edge of bed 10/1, possible LOC but not clear history.  CT head 10/1: Report as below but mentions possibility of a late acute to subacute infarct, however recent MRI was negative for acute findings.  Discuss with neurology.  DVT prophylaxis: Subcutaneous heparin Code Status: Full Family Communication: None at bedside Disposition: Pending clinical improvement, possibly 10/3.   Consultants:  Neurology Cardiology CCM  Procedures:  None  Antimicrobials:  None   Subjective: Headache improving, states that it is down to 7/10 from 8-9/10 although he does not appear uncomfortable talking to me.  No visual symptoms reported.  Ongoing intermittent left-sided chest pain, worse with deep inspiration and occasional radiation to left upper extremity which was 2 days  ago.  No dyspnea.  Reports father passed away from congestive heart failure in his 34s.  ROS: As above, otherwise negative.  Objective:  Vitals:   07/19/18  0637 07/19/18 1054 07/19/18 1225 07/19/18 1437  BP:  (!) 133/92 (!) 141/95 122/83  Pulse:   94   Resp:   17   Temp:   98.7 F (37.1 C)   TempSrc:   Oral   SpO2:   97%   Weight: 89.4 kg     Height:        Examination:  General exam: Pleasant young male, moderately built and nourished sitting up comfortably in bed without distress. Respiratory system: Clear to auscultation. Respiratory effort normal.  Some reproducible precordial chest wall tenderness. Cardiovascular system: S1 & S2 heard, RRR. No JVD, murmurs, rubs, gallops or clicks. No pedal edema.  Telemetry personally reviewed: SB in the 50s-SR in the 60s. Gastrointestinal system: Abdomen is nondistended, soft and nontender. No organomegaly or masses felt. Normal bowel sounds heard. Central nervous system: Alert and oriented. No focal neurological deficits. Extremities: Symmetric 5 x 5 power. Skin: No rashes, lesions or ulcers Psychiatry: Judgement and insight appear normal. Mood & affect appropriate.     Data Reviewed: I have personally reviewed following labs and imaging studies  CBC: Recent Labs  Lab 07/14/18 0223 07/17/18 0545 07/18/18 0440 07/19/18 0438  WBC 8.3 7.4 8.2 13.2*  NEUTROABS 6.2  --   --   --   HGB 14.0 13.4 13.4 14.8  HCT 43.8 43.0 42.3 45.9  MCV 72.4* 73.5* 72.6* 71.4*  PLT 192 204 195 562   Basic Metabolic Panel: Recent Labs  Lab 07/14/18 0223 07/16/18 0504 07/17/18 0545 07/18/18 0440 07/19/18 0438  NA 137 138 138 135 134*  K 4.0 4.8 4.1 3.7 4.0  CL 103 105 102 102 101  CO2 23 24 28 29 23   GLUCOSE 105* 90 90 91 122*  BUN 13 31* 24* 23* 21*  CREATININE 1.39* 1.78* 1.75* 1.64* 1.67*  CALCIUM 9.7 8.9 9.2 8.8* 9.5  MG 2.0  --   --  2.0 2.2  PHOS 4.5  --   --   --   --    Liver Function Tests: Recent Labs  Lab 07/14/18 0223  AST 15  ALT 15  ALKPHOS 86  BILITOT 0.7  PROT 7.8  ALBUMIN 4.0   Coagulation Profile: Recent Labs  Lab 07/14/18 0223  INR 1.09   Cardiac  Enzymes: Recent Labs  Lab 07/14/18 0223 07/14/18 0719 07/14/18 1340 07/15/18 2017  TROPONINI <0.03 <0.03 <0.03 <0.03   CBG: Recent Labs  Lab 07/16/18 0815  GLUCAP 80    Recent Results (from the past 240 hour(s))  MRSA PCR Screening     Status: Abnormal   Collection Time: 07/14/18  1:51 AM  Result Value Ref Range Status   MRSA by PCR POSITIVE (A) NEGATIVE Final    Comment:        The GeneXpert MRSA Assay (FDA approved for NASAL specimens only), is one component of a comprehensive MRSA colonization surveillance program. It is not intended to diagnose MRSA infection nor to guide or monitor treatment for MRSA infections. RESULT CALLED TO, READ BACK BY AND VERIFIED WITH: A HAMAD RN 0421 07/14/18 A BROWNING Performed at [facility], [place] [address], [place], [place] [address]          Radiology Studies: Ct Head Wo Contrast  Result Date: 07/18/2018 CLINICAL DATA:  57 y/o M; found on  floor. Headache prior to the fall. EXAM: CT HEAD WITHOUT CONTRAST TECHNIQUE: Contiguous axial images were obtained from the base of the skull through the vertex without intravenous contrast. COMPARISON:  11/15/2012 and 01/10/2009 CT of the head. FINDINGS: Brain: Hypoattenuation within the left caudate head and anterior lentiform nucleus. No associated mass effect or hemorrhage. Few nonspecific white matter hypodensities in subcortical and periventricular white matter are compatible with mild chronic microvascular ischemic changes for age. Mild volume loss of the brain. Findings are mildly progressed from prior studies. Vascular: Mild calcific atherosclerosis of carotid siphons. No hyperdense vessel identified. Skull: Normal. Negative for fracture or focal lesion. Sinuses/Orbits: No acute finding. Other: None. IMPRESSION: 1. Hypoattenuation within the left caudate head and anterior lentiform nucleus may represent a late acute to subacute infarction. This can be further characterized with MRI of  the head. 2. Progression of mild chronic microvascular ischemic changes and volume loss of the brain from prior studies. These results will be called to the ordering clinician or representative by the Radiologist Assistant, and communication documented in the PACS or zVision Dashboard. Electronically Signed   By: Kristine Garbe M.D.   On: 07/18/2018 22:59        Scheduled Meds: . amLODipine  10 mg Oral Daily  . aspirin EC  81 mg Oral Daily  . heparin  5,000 Units Subcutaneous Q8H  . hydrALAZINE  50 mg Oral Q8H  . hydrochlorothiazide  12.5 mg Oral Daily  . prochlorperazine  10 mg Intravenous Once  . venlafaxine XR  37.5 mg Oral Q breakfast   Continuous Infusions:   LOS: 5 days     Vernell Leep, MD, FACP, [facility] Pager [phone]  If 7PM-7AM, please contact night-coverage [email or web address] Password [email or web address] 07/19/2018, 5:40 PM

## 2018-07-19 NOTE — Progress Notes (Signed)
Subjective: States he feels much better today.  He feels that this is due to both decrease in his blood pressure plus he had a bowel movement.  Feels his headache has improved significantly however when asked he states he only went from an 8/10 to 7/10.  Upon entering room patient was resting comfortably in bed.   Exam: Vitals:   07/19/18 1054 07/19/18 1225  BP: (!) 133/92 (!) 141/95  Pulse:  94  Resp:  17  Temp:  98.7 F (37.1 C)  SpO2:  97%    Physical Exam   HEENT-  Normocephalic, no lesions, without obvious abnormality.  Normal external eye and conjunctiva.   Extremities- Warm, dry and intact Musculoskeletal-no joint tenderness, deformity or swelling Skin-warm and dry, no hyperpigmentation, vitiligo, or suspicious lesions    Neuro:  Mental Status: Alert, oriented, thought content appropriate.  Speech fluent without evidence of aphasia.  Able to follow 3 step commands without difficulty. Cranial Nerves: II:  Visual fields grossly normal,  III,IV, VI: ptosis not present, extra-ocular motions intact bilaterally pupils equal, round, reactive to light and accommodation V,VII: smile symmetric, facial light touch sensation normal bilaterally VIII: hearing normal bilaterally IX,X: uvula rises midline XI: bilateral shoulder shrug XII: midline tongue extension Motor: Right : Upper extremity   5/5    Left:     Upper extremity   5/5  Lower extremity   5/5     Lower extremity   5/5 Tone and bulk:normal tone throughout; no atrophy noted Sensory: Pinprick and light touch intact throughout, bilaterally Deep Tendon Reflexes: 2+ and symmetric throughout Plantars: Right: downgoing   Left: downgoing Cerebellar: normal finger-to-nose, normal rapid alternating movements and normal heel-to-shin test     Medications:  Prior to Admission:  Medications Prior to Admission  Medication Sig Dispense Refill Last Dose  . ALPRAZolam (XANAX) 1 MG tablet Take one half to one qhs prn sleep 30  tablet 5 unk at prn  . diltiazem (CARDIZEM CD) 120 MG 24 hr capsule Take 1 capsule (120 mg total) by mouth 2 (two) times daily. 60 capsule 5 07/11/2018  . FLUoxetine (PROZAC) 20 MG tablet Take 2 tablets (40 mg total) by mouth daily. 30 tablet 5 07/11/2018  . losartan (COZAAR) 100 MG tablet Take 1 tablet (100 mg total) by mouth daily. 30 tablet 5 07/11/2018  . UNKNOWN TO PATIENT Inject as directed every 3 (three) months. INJECTIONS for MIGRAINES administered by Orie Rout, MD at [place], [place]   Past Week at Unknown time   Scheduled: . amLODipine  10 mg Oral Daily  . aspirin EC  81 mg Oral Daily  . heparin  5,000 Units Subcutaneous Q8H  . hydrALAZINE  50 mg Oral Q8H  . hydrochlorothiazide  12.5 mg Oral Daily  . prochlorperazine  10 mg Intravenous Once  . venlafaxine XR  37.5 mg Oral Q breakfast    Pertinent Labs/Diagnostics: -Glucose 122 -BUN 21 -Creatinine 167  Of note: Blood pressure has improved significantly since last night at [place].  Last 3 blood pressures have been 126/96, 141/92, 142/94.        Assessment: 57 year old male with past medical history of hypertension, migraine headaches in the past which can last up to 3 to 7 days.  Upon entering the emergency department patient was suffering from hypertensive urgency with a throbbing headache.  Patient did receive multiple abortive medications including magnesium, Depakote, Compazine, Toradol, Fioricet, Benadryl, Decadron, morphine, Phenergan, Imitrex.  All of which did not abort his  headache.  At this time with lowering his blood pressure he feels his headache has significantly improved.  Impression: Migraine headache versus hypertensive headache  Recommendations: -Continue blood pressure control -Follow-up with headache specialist as an outpatient - Neurology will sign off  Etta Quill PA-C [facility] [phone]  07/19/2018, 12:26 PM

## 2018-07-19 NOTE — Consult Note (Addendum)
Cardiology Consultation:   Patient ID: [name]; [id number]; 1961/01/12   Admit date: 07/14/2018 Date of Consult: 07/19/2018  Primary Care Provider: Mikey Kirschner, MD Primary Cardiologist: New to [place]  Primary Electrophysiologist:  None   Patient Profile:   [name] is a 57 y.o. male with a PMH of HTN, cocaine abuse, tobacco abuse, and migraine headaches, who is being seen today for the evaluation of chest pain at the request of Dr. Algis Liming.  History of Present Illness:   Mr. Cure initially presented to [facility] in [place] with compalints of right knee pain and was found to have SBP >200. Also with complaints of acute migraine and chest pain at that time. Utox was positive for cocaine and THC, though he denies illicit drug use. He underwent a CT Head which was negative, +Ddimer but CTA Chest negative for PE, LE duplex was negative for DVT, Troponins were negative, TTE showed normal LV size/systolic function, mild-moderate LVH, G1DD, EF 55-60%, Renal US which was without hydronephrosis, and MRI brain which showed small vessel ischemic changes. He was evaluated/cleared by psychiatry for SI. Per H&P, he had a disagreement with cardiology and [facility] and requested transfer of care to [facility] for further evaluation.   He was admitted to the ICU here for hypertensive emergency. BP medications have been titrated throughout hospitalization, however BP still somewhat labile. Neurology evaluated for migraines. Hospital course has been complicated by AKI with Cr peak 1.78 (baseline 1.5). He has also complained of chest pain since arriving to [facility] - troponins again negative, Echo repeated and confirms normal LV systolic function, EF 54-65%, G1DD, and mild LVH. EKGs show sinus rhythm with LVH and non-specific T wave abnormalities seen on prior EKGs in 2014.   At the time of this evaluation he reports feeling much better. His migraine has improved significantly. He reports  continued intermittent chest pain which only occurs with deep inspiration. He smokes ~4 cigarettes a day. He denies family history of CAD. He denies exertional chest pain, SOB, DOE, LE edema, orthopnea, PND, or syncope.    Past Medical History:  Diagnosis Date  . Depression   . H/O blood clots 2008  . Hypertension   . Migraine   . Sleep apnea     Past Surgical History:  Procedure Laterality Date  . HERNIA REPAIR    . KNEE SURGERY       Home Medications:  Prior to Admission medications   Medication Sig Start Date End Date Taking? Authorizing Provider  ALPRAZolam Duanne Moron) 1 MG tablet Take one half to one qhs prn sleep 12/20/17  Yes Mikey Kirschner, MD  diltiazem (CARDIZEM CD) 120 MG 24 hr capsule Take 1 capsule (120 mg total) by mouth 2 (two) times daily. 12/20/17  Yes Mikey Kirschner, MD  FLUoxetine (PROZAC) 20 MG tablet Take 2 tablets (40 mg total) by mouth daily. 12/20/17  Yes Mikey Kirschner, MD  losartan (COZAAR) 100 MG tablet Take 1 tablet (100 mg total) by mouth daily. 12/20/17  Yes Mikey Kirschner, MD  UNKNOWN TO PATIENT Inject as directed every 3 (three) months. INJECTIONS for MIGRAINES administered by Orie Rout, MD at [facility], [place], [place]   Yes [provider]    Inpatient Medications: Scheduled Meds: . amLODipine  10 mg Oral Daily  . aspirin EC  81 mg Oral Daily  . heparin  5,000 Units Subcutaneous Q8H  . hydrALAZINE  50 mg Oral Q8H  . hydrochlorothiazide  12.5 mg Oral Daily  . prochlorperazine  10 mg Intravenous Once  . venlafaxine XR  37.5 mg Oral Q breakfast   Continuous Infusions:  PRN Meds: acetaminophen, bisacodyl, hydrALAZINE, zolpidem  Allergies:    Allergies  Allergen Reactions  . Zoloft [Sertraline Hcl]   . Toprol Xl [Metoprolol Tartrate]     sluggish    Social History:   Social History   Socioeconomic History  . Marital status: Legally Separated    Spouse name: Not on file  . Number of children: Not on file   . Years of education: Not on file  . Highest education level: Not on file  Occupational History  . Not on file  Social Needs  . Financial resource strain: Not hard at all  . Food insecurity:    Worry: Never true    Inability: Never true  . Transportation needs:    Medical: No    Non-medical: No  Tobacco Use  . Smoking status: Former Research scientist (life sciences)  . Smokeless tobacco: Never Used  Substance and Sexual Activity  . Alcohol use: Yes    Comment: "occasionally"  . Drug use: Yes    Types: Cocaine, Marijuana  . Sexual activity: Not on file  Lifestyle  . Physical activity:    Days per week: Patient refused    Minutes per session: Patient refused  . Stress: Not on file  Relationships  . Social connections:    Talks on phone: Not on file    Gets together: Not on file    Attends religious service: Not on file    Active member of club or organization: Not on file    Attends meetings of clubs or organizations: Not on file    Relationship status: Not on file  . Intimate partner violence:    Fear of current or ex partner: Not on file    Emotionally abused: Not on file    Physically abused: Not on file    Forced sexual activity: Not on file  Other Topics Concern  . Not on file  Social History Narrative  . Not on file    Family History:    Family History  Problem Relation Age of Onset  . Stroke Mother   . Heart disease Father   . Diabetes Father      ROS:  Please see the history of present illness.   All other ROS reviewed and negative.     Physical Exam/Data:   Vitals:   07/19/18 0617 07/19/18 0637 07/19/18 1054 07/19/18 1225  BP: (!) 142/94  (!) 133/92 (!) 141/95  Pulse: 92   94  Resp: (!) 2   17  Temp: 98.5 F (36.9 C)   98.7 F (37.1 C)  TempSrc: Oral   Oral  SpO2: 98%   97%  Weight:  89.4 kg    Height:        Intake/Output Summary (Last 24 hours) at 07/19/2018 1420 Last data filed at 07/18/2018 2200 Gross per 24 hour  Intake 240 ml  Output 400 ml  Net -160  ml   Filed Weights   07/17/18 0614 07/18/18 0522 07/19/18 0637  Weight: 91.1 kg 89.4 kg 89.4 kg   Body mass index is 26.72 kg/m.  General:  Well nourished, well developed, in no acute distress HEENT: sclera anicteric, poor dentition   Lymph: no adenopathy Neck: no JVD Endocrine:  No thryomegaly Vascular: No carotid bruits; distal pulses 2+ bilaterally Cardiac:  normal S1, S2; RRR; no murmurs, rubs, or  gallops Lungs:  clear to auscultation bilaterally, no wheezing, rhonchi or rales  Abd: NABS, soft, nontender, no hepatomegaly Ext: no edema Musculoskeletal:  No deformities, BUE and BLE strength normal and equal Skin: warm and dry  Neuro:  CNs 2-12 intact, no focal abnormalities noted Psych:  Normal affect   EKG:  The EKG was personally reviewed and demonstrates:  sinus rhythm with LVH and non-specific T wave abnormalities seen on prior EKGs in 2014 Telemetry:  Telemetry was personally reviewed and demonstrates:  Sinus rhythm with intermittent sinus tachycardia  Relevant CV Studies: Echocardiogram 07/17/18: Study Conclusions  - Left ventricle: The cavity size was normal. Wall thickness was   increased in a pattern of mild LVH. Systolic function was normal.   The estimated ejection fraction was in the range of 50% to 55%.   Doppler parameters are consistent with abnormal left ventricular   relaxation (grade 1 diastolic dysfunction).  Laboratory Data:  Chemistry Recent Labs  Lab 07/17/18 0545 07/18/18 0440 07/19/18 0438  NA 138 135 134*  K 4.1 3.7 4.0  CL 102 102 101  CO2 28 29 23   GLUCOSE 90 91 122*  BUN 24* 23* 21*  CREATININE 1.75* 1.64* 1.67*  CALCIUM 9.2 8.8* 9.5  GFRNONAA 41* 45* 44*  GFRAA 48* 52* 51*  ANIONGAP 8 4* 10    Recent Labs  Lab 07/14/18 0223  PROT 7.8  ALBUMIN 4.0  AST 15  ALT 15  ALKPHOS 86  BILITOT 0.7   Hematology Recent Labs  Lab 07/17/18 0545 07/18/18 0440 07/19/18 0438  WBC 7.4 8.2 13.2*  RBC 5.85* 5.83* 6.43*  HGB 13.4 13.4  14.8  HCT 43.0 42.3 45.9  MCV 73.5* 72.6* 71.4*  MCH 22.9* 23.0* 23.0*  MCHC 31.2 31.7 32.2  RDW 14.4 14.1 14.4  PLT 204 195 230   Cardiac Enzymes Recent Labs  Lab 07/14/18 0223 07/14/18 0719 07/14/18 1340 07/15/18 2017  TROPONINI <0.03 <0.03 <0.03 <0.03   No results for input(s): TROPIPOC in the last 168 hours.  BNP Recent Labs  Lab 07/14/18 0223  BNP 30.2    DDimer No results for input(s): DDIMER in the last 168 hours.  Radiology/Studies:  Ct Head Wo Contrast  Result Date: 07/18/2018 CLINICAL DATA:  57 y/o M; found on floor. Headache prior to the fall. EXAM: CT HEAD WITHOUT CONTRAST TECHNIQUE: Contiguous axial images were obtained from the base of the skull through the vertex without intravenous contrast. COMPARISON:  11/15/2012 and 01/10/2009 CT of the head. FINDINGS: Brain: Hypoattenuation within the left caudate head and anterior lentiform nucleus. No associated mass effect or hemorrhage. Few nonspecific white matter hypodensities in subcortical and periventricular white matter are compatible with mild chronic microvascular ischemic changes for age. Mild volume loss of the brain. Findings are mildly progressed from prior studies. Vascular: Mild calcific atherosclerosis of carotid siphons. No hyperdense vessel identified. Skull: Normal. Negative for fracture or focal lesion. Sinuses/Orbits: No acute finding. Other: None. IMPRESSION: 1. Hypoattenuation within the left caudate head and anterior lentiform nucleus may represent a late acute to subacute infarction. This can be further characterized with MRI of the head. 2. Progression of mild chronic microvascular ischemic changes and volume loss of the brain from prior studies. These results will be called to the ordering clinician or representative by the Radiologist Assistant, and communication documented in the PACS or zVision Dashboard. Electronically Signed   By: Kristine Garbe M.D.   On: 07/18/2018 22:59   D[facility]  Result Date: 07/15/2018 CLINICAL DATA:  Chest pain EXAM: PORTABLE CHEST 1 VIEW COMPARISON:  07/14/2018, 02/09/2015 FINDINGS: Chronic elevation of the right diaphragm with minimal atelectasis at the right base. No pleural effusion. Stable borderline enlarged cardiomediastinal silhouette. No pneumothorax. IMPRESSION: No active disease. Chronically elevated right diaphragm with subsegmental atelectasis Electronically Signed   By: Donavan Foil M.D.   On: 07/15/2018 18:23    Assessment and Plan:   1. Atypical chest pain: patient reports intermittent pleuritic chest pain. No exertional symptoms. Troponins have been negative on multiple occassions. EKG with non-specific T wave abnormalities seen on previous. Echo with EF 50-55%, G1DD, mild LVH, and no wall motion abnormalities. Work-up prior to arrival to [facility] included a CTA chest which was negative for PE.  - No further cardiac work-up at this time.  - Continue aggressive BP management  2. Hypertensive emergency: BP remains labile. Has not required any IV hydralazine today. Could be contributing to #1.  - Continue amlodipine, hydralazine, and HTCZ  - Consider restarting home losartan if Cr returns to baseline 1.5.  3. Polysubstance abuse: patient smokes ~4 cigarettes a day. Utox +cocaine and marijuana, though patient adamantly denies illicit drug use.  - Continue to encourage tobacco cessation - Continue to encourage abstinence from illicit drugs    For questions or updates, please contact [place] Please consult [email or web address] for contact info under Cardiology/STEMI.   Signed, Abigail Butts, PA-C  07/19/2018 2:20 PM [phone]  I have examined the patient and reviewed assessment and plan and discussed with patient.  Agree with above as stated.  MSK pain in the chest wall, worse with deep breathing.  Control BP, avoid cocaine.  Increase exericse to 150 minutes/week.  No further cardiac testing needed.    Larae Grooms

## 2018-07-19 NOTE — Progress Notes (Deleted)
Subjective: Subjective says he feels much better today.  He thinks is due to the fact that he had a bowel movement.  He states that his headache has improved significantly.  It has gone from an 8/10 to a 7/10.  He is resting comfortably in bed.  He also states that his headache has improved secondary to lowering his blood pressure. No longer getting narcotics and not having any issues.  Exam: Vitals:   07/18/18 2146 07/19/18 0617  BP: (!) 141/92 (!) 142/94  Pulse:  92  Resp:  (!) 2  Temp:  98.5 F (36.9 C)  SpO2:  98%    Physical Exam   HEENT-  Normocephalic, no lesions, without obvious abnormality.  Normal external eye and conjunctiva.   Extremities- Warm, dry and intact Musculoskeletal-no joint tenderness, deformity or swelling Skin-warm and dry, no hyperpigmentation, vitiligo, or suspicious lesions    Neuro:  Mental Status: Alert, oriented, thought content appropriate.  Speech fluent without evidence of aphasia.  Able to follow 3 step commands without difficulty. Cranial Nerves: II:  Visual fields grossly normal,  III,IV, VI: ptosis not present, extra-ocular motions intact bilaterally pupils equal, round, reactive to light and accommodation V,VII: smile symmetric, facial light touch sensation normal bilaterally VIII: hearing normal bilaterally IX,X: uvula rises midline XI: bilateral shoulder shrug XII: midline tongue extension Motor: Right : Upper extremity   5/5    Left:     Upper extremity   5/5  Lower extremity   5/5     Lower extremity   5/5 Tone and bulk:normal tone throughout; no atrophy noted Sensory: Pinprick and light touch intact throughout, bilaterally Deep Tendon Reflexes: 2+ and symmetric throughout Plantars: Right: downgoing   Left: downgoing Cerebellar: normal finger-to-nose, and normal heel-to-shin test     Medications:  Scheduled: . amLODipine  10 mg Oral Daily  . aspirin EC  81 mg Oral Daily  . heparin  5,000 Units Subcutaneous Q8H  .  hydrALAZINE  50 mg Oral Q8H  . hydrochlorothiazide  12.5 mg Oral Daily  . prochlorperazine  10 mg Intravenous Once  . venlafaxine XR  37.5 mg Oral Q breakfast      Pertinent Labs/Diagnostics: -Glucose 122 -BUN 21 -Creatinine 167.  Blood pressure has improved significantly since last night at [place].  Last 3 blood pressures have been 126/96, 141/92, 142/94.        Assessment:  57 year old male with past medical history of hypertension, migraine headaches in the past which can last up to 3 to 7 days.  Upon arrival in the ED patient was suffering from significantly elevated blood pressure along with throbbing headache.  Patient has received multiple abortive medications including magnesium, Depakote, Compazine, Toradol, Benadryl, Fioricet, Decadron, morphine, Phenergan, Imitrex.,  All of which did not seem to abort his headache.  At this point in time patient's blood pressure was lowered to 140s/90s and his headache has seemed to improve significantly.   Impression: Migraine headache   Recommendations: -Continue blood pressure control -Follow-up with headache specialist as an outpatient --Neurology will S/O   Etta Quill PA-C [facility] [phone] 07/19/2018, 10:49 AM

## 2018-07-19 NOTE — Progress Notes (Signed)
Patient resting comfortably during shift report. Denies complaints.  

## 2018-07-19 NOTE — Plan of Care (Signed)
  Problem: Education: Goal: Knowledge of disease or condition will improve Outcome: Progressing Goal: Understanding of discharge needs will improve Outcome: Progressing   Problem: Health Behavior/Discharge Planning: Goal: Ability to identify changes in lifestyle to reduce recurrence of condition will improve Outcome: Progressing Goal: Identification of resources available to assist in meeting health care needs will improve Outcome: Progressing   Problem: Physical Regulation: Goal: Complications related to the disease process, condition or treatment will be avoided or minimized Outcome: Progressing   Problem: Safety: Goal: Ability to remain free from injury will improve Outcome: Progressing   Problem: Education: Goal: Knowledge of General Education information will improve Description: Including pain rating scale, medication(s)/side effects and non-pharmacologic comfort measures Outcome: Progressing   Problem: Health Behavior/Discharge Planning: Goal: Ability to manage health-related needs will improve Outcome: Progressing   Problem: Clinical Measurements: Goal: Ability to maintain clinical measurements within normal limits will improve Outcome: Progressing Goal: Will remain free from infection Outcome: Progressing Goal: Diagnostic test results will improve Outcome: Progressing Goal: Respiratory complications will improve Outcome: Progressing Goal: Cardiovascular complication will be avoided Outcome: Progressing   Problem: Activity: Goal: Risk for activity intolerance will decrease Outcome: Progressing   Problem: Nutrition: Goal: Adequate nutrition will be maintained Outcome: Progressing   Problem: Coping: Goal: Level of anxiety will decrease Outcome: Progressing   Problem: Elimination: Goal: Will not experience complications related to bowel motility Outcome: Progressing Goal: Will not experience complications related to urinary retention Outcome:  Progressing   Problem: Pain Managment: Goal: General experience of comfort will improve Outcome: Progressing   Problem: Safety: Goal: Ability to remain free from injury will improve Outcome: Progressing   Problem: Skin Integrity: Goal: Risk for impaired skin integrity will decrease Outcome: Progressing   

## 2018-07-20 LAB — BASIC METABOLIC PANEL
Anion gap: 9 (ref 5–15)
BUN: 25 mg/dL — AB (ref 6–20)
CHLORIDE: 101 mmol/L (ref 98–111)
CO2: 26 mmol/L (ref 22–32)
Calcium: 9.9 mg/dL (ref 8.9–10.3)
Creatinine, Ser: 1.79 mg/dL — ABNORMAL HIGH (ref 0.61–1.24)
GFR calc Af Amer: 46 mL/min — ABNORMAL LOW (ref >60)
GFR calc non Af Amer: 40 mL/min — ABNORMAL LOW (ref >60)
GLUCOSE: 101 mg/dL — AB (ref 70–99)
POTASSIUM: 3.9 mmol/L (ref 3.5–5.1)
Sodium: 136 mmol/L (ref 135–145)

## 2018-07-20 MED ORDER — HYDRALAZINE HCL 50 MG PO TABS: 75.0000 mg | ORAL_TABLET | Freq: Three times a day (TID) | ORAL | 0 refills | Status: DC

## 2018-07-20 MED ORDER — HYDROCHLOROTHIAZIDE 12.5 MG PO CAPS: 12.5000 mg | ORAL_CAPSULE | Freq: Every day | ORAL | 0 refills | Status: DC

## 2018-07-20 MED ORDER — ASPIRIN 81 MG PO TBEC: 81.0000 mg | DELAYED_RELEASE_TABLET | Freq: Every day | ORAL | 0 refills | Status: DC

## 2018-07-20 MED ORDER — AMLODIPINE BESYLATE 10 MG PO TABS: 10.0000 mg | ORAL_TABLET | Freq: Every day | ORAL | 0 refills | Status: DC

## 2018-07-20 NOTE — Clinical Social Work Note (Signed)
Clinical Social Work Assessment  Patient Details  Name: [name] MRN: [id number] Date of Birth: 1961-05-31  Date of referral:  07/20/18               Reason for consult:  Substance Use/ETOH Abuse                Permission sought to share information with:    Permission granted to share information::  No  Name::        Agency::     Relationship::     Contact Information:     Housing/Transportation Living arrangements for the past 2 months:  Single Family Home Source of Information:  Patient, Medical Team Patient Interpreter Needed:  None Criminal Activity/Legal Involvement Pertinent to Current Situation/Hospitalization:  No - Comment as needed Significant Relationships:  Adult Children, Spouse, Significant Other Lives with:  Spouse Do you feel safe going back to the place where you live?  Yes Need for family participation in patient care:  Yes (Comment)  Care giving concerns:  Substance abuse.   Social Worker assessment / plan:  CSW met with patient. No supports at bedside. CSW introduced role and inquired about interest in substance abuse resources. Patient stated he did not need them and wanted to know why everyone kept bringing that up. Patient is agreeable to counseling resources due to a history of depression. He stated he had promised his daughter he would start going. CSW provided list of treatment centers within 25 miles of his home. Patient also requested list for [facility]. Patient has family/friends in the room. Left resources on front of chart. Will ask RN to give to him when family leaves. No further concerns. CSW signing off. Consult again if any other social work needs arise.  Employment status:  [employer] information:  Other (Comment Required)([employer]) PT Recommendations:  Not assessed at this time Information / Referral to community resources:  Outpatient Psychiatric Care (Comment Required), Outpatient Substance Abuse Treatment  Options  Patient/Family's Response to care:  Patient declined substance abuse resources but agreeable to counseling resources. Patient's family supportive but he does not not want them to be present for discussions regarding care. Patient appreciated social work intervention.  Patient/Family's Understanding of and Emotional Response to Diagnosis, Current Treatment, and Prognosis:  Patient has a good understanding of the reason for admission but stated he does not understand why staff keep mentioning substance abuse. Patient appears happy with hospital care.  Emotional Assessment Appearance:  Appears stated age Attitude/Demeanor/Rapport:  Engaged, Gracious Affect (typically observed):  Accepting, Appropriate, Calm, Pleasant Orientation:  Oriented to Self, Oriented to Place, Oriented to  Time, Oriented to Situation Alcohol / Substance use:  Illicit Drugs Psych involvement (Current and /or in the community):  No (Comment)  Discharge Needs  Concerns to be addressed:  Care Coordination Readmission within the last 30 days:  No Current discharge risk:  Substance Abuse Barriers to Discharge:  Continued Medical Work up   Candie Chroman, LCSW 07/20/2018, 11:44 AM

## 2018-07-20 NOTE — Discharge Summary (Signed)
Physician Discharge Summary  [name] MEQ:[id number] DOB: [birth year]  PCP: Mikey Kirschner, MD  Admit date: 07/14/2018 Discharge date: 07/20/2018  Recommendations for Outpatient Follow-up:  1. Dr. Baltazar Apo, in 5 days with repeat labs (CBC & BMP). 2. Dr. Orie Rout, patient apparently has seen him for his migraine headaches and is advised to follow-up. 3. Patient has also been provided with contact information in case he wishes to find a new PCP like he has expressed.  Home Health: None Equipment/Devices: None  Discharge Condition: Improved and stable CODE STATUS: Full Diet recommendation: Heart healthy diet.  Discharge Diagnoses:  Active Problems:   Hypertensive emergency   Brief Summary: 57 year old male with PMH of HTN, migraines, tobacco abuse, consistently denies other substance abuse such as cocaine or THC which were apparently found on his UDS, transferred from [facility] in [place], [place] at family's request for ongoing care of hypertensive emergency, chest pain, right knee pain and refractory migraine.  He was admitted to OSH 9/25 and transferred to [facility] on 9/27.  Presenting SBP >200 mmHg.  He had reported intermittent chest pain for the week prior to admission and ongoing severe migraine.  Admitted by CCM for hypertensive emergency, migraine and acute kidney injury.  After stabilization, patient's care was transferred to [facility].  Neurology and cardiology were consulted.   Assessment & Plan:  Hypertensive emergency: Initially admitted to ICU under CCM care.  Antihypertensives were titrated during this admission and blood pressures have significantly improved but not optimal yet. He has chronic possibly poorly controlled hypertension and this needs to be gradually lowered to optimal range. Diltiazem and losartan were discontinued.  Patient was started on amlodipine 10 mg daily, HCTZ 12.5 mg daily, hydralazine was initiated and will be increased at discharge to 75  mg 3 times daily.  Recommend close outpatient follow-up and adjust medications as deemed necessary.    Atypical chest pain: Patient reported intermittent left-sided chest pain which was pleuritic nature to but also reported radiating to left upper extremity.  Troponin cycled and negative.  EKG without acute findings.  As per H&P, CTA chest and right lower extremity venous Dopplers 9/25 at OSH: Negative for PE or DVT.  TTE at OSH 9/25: LVEF 55-60%.  TTE done here on 9/30: LVEF 50-55% and grade 1 diastolic dysfunction. Cardiology was consulted and suspect musculoskeletal chest pain, recommend controlling blood pressure, avoid cocaine which patient denies and no further cardiac testing recommended.  Chest pain resolved.  Tobacco abuse/UDS positive for cocaine and THC: Patient volunteers to smoking tobacco but they have medically denies other substance abuse.  He states that a few days prior to admission, he was at a party and shared smoke with someone and does not know if there were drugs in that.  Cessation counseled.  Migraine headache: Neurology was consulted.  In ED, patient had hypertensive urgency with throbbing headache.  He received multiple abortive medications including magnesium, Depakote, Compazine, Toradol, Fioricet, Benadryl, Decadron, morphine, Phenergan and Imitrex all of which did not abort his headache.  However with controlling his blood pressures, headaches have finally resolved.  Neurology recommends continuing to control blood pressures, outpatient follow-up with headache specialist and Neurology signed off 10/2.  CT head 9/25 at OSH: Nonacute study, MRI brain 9/26 at OSH: Small vessel ischemic changes.  No acute abnormalities.  Opioids discontinued.  CT head 10/1 had some abnormalities reported as below.  I reviewed the CT imaging with neuro hospitalist on call today who indicated that these findings  were likely old, old stroke and recommended continuing aspirin for secondary stroke  prophylaxis and stroke risk management as outpatient.  Acute on stage III chronic kidney disease: Noted to have creatinine of 1.54 in August 2018.  Creatinine is stable in the 1.6-1.7 range for the last 5 days.  Avoid nephrotoxic's.  Consider renal ultrasound as outpatient if one has not recently been done. Recommend close follow-up with BMP as outpatient.  Consider outpatient nephrology consultation.  Fall: Patient apparently fell from edge of bed 10/1, possible LOC but not clear history.  CT head 10/1: Report as below but mentions possibility of a late acute to subacute infarct, however recent MRI was negative for acute findings.    Please see discussion above with Neurology.    Consultants:  Neurology Cardiology CCM  Procedures:  None   Discharge Instructions  Discharge Instructions    Call MD for:  difficulty breathing, headache or visual disturbances   Complete by:  As directed    Call MD for:  extreme fatigue   Complete by:  As directed    Call MD for:  persistant dizziness or light-headedness   Complete by:  As directed    Call MD for:  severe uncontrolled pain   Complete by:  As directed    Diet - low sodium heart healthy   Complete by:  As directed    Increase activity slowly   Complete by:  As directed        Medication List    STOP taking these medications   diltiazem 120 MG 24 hr capsule Commonly known as:  CARDIZEM CD   losartan 100 MG tablet Commonly known as:  COZAAR     TAKE these medications   ALPRAZolam 1 MG tablet Commonly known as:  XANAX Take one half to one qhs prn sleep   amLODipine 10 MG tablet Commonly known as:  NORVASC Take 1 tablet (10 mg total) by mouth daily. Start taking on:  07/21/2018   aspirin 81 MG EC tablet Take 1 tablet (81 mg total) by mouth daily. Start taking on:  07/21/2018   FLUoxetine 20 MG tablet Commonly known as:  PROZAC Take 2 tablets (40 mg total) by mouth daily.   hydrALAZINE 50 MG tablet Commonly  known as:  APRESOLINE Take 1.5 tablets (75 mg total) by mouth 3 (three) times daily.   hydrochlorothiazide 12.5 MG capsule Commonly known as:  MICROZIDE Take 1 capsule (12.5 mg total) by mouth daily. Start taking on:  07/21/2018   UNKNOWN TO PATIENT Inject as directed every 3 (three) months. INJECTIONS for MIGRAINES administered by Orie Rout, MD at [place], [place]      Follow-up Information    [facility] Follow up.   Contact information: Please call [phone] for assistance with locating a [facility] physician.  Also, you can contact your insurance company directly and request assistance with locating a PCP within their network       Luking, Grace Bushy, MD. Schedule an appointment as soon as possible for a visit in 5 days.   Specialty:  Family Medicine Why:  To be seen with repeat labs (CBC & BMP). Contact information: [place] [address] [phone]        Schedule an appointment as soon as possible for a visit with Orie Rout, MD.   Specialty:  Specialist Why:  Follow-up regarding your migraine headache management. Contact information: [place] [place] [address] [phone]  Allergies  Allergen Reactions  . Zoloft [Sertraline Hcl]   . Toprol Xl [Metoprolol Tartrate]     sluggish      Procedures/Studies: Ct Head Wo Contrast  Result Date: 07/18/2018 CLINICAL DATA:  57 y/o M; found on floor. Headache prior to the fall. EXAM: CT HEAD WITHOUT CONTRAST TECHNIQUE: Contiguous axial images were obtained from the base of the skull through the vertex without intravenous contrast. COMPARISON:  11/15/2012 and 01/10/2009 CT of the head. FINDINGS: Brain: Hypoattenuation within the left caudate head and anterior lentiform nucleus. No associated mass effect or hemorrhage. Few nonspecific white matter hypodensities in subcortical and periventricular white matter are compatible with  mild chronic microvascular ischemic changes for age. Mild volume loss of the brain. Findings are mildly progressed from prior studies. Vascular: Mild calcific atherosclerosis of carotid siphons. No hyperdense vessel identified. Skull: Normal. Negative for fracture or focal lesion. Sinuses/Orbits: No acute finding. Other: None. IMPRESSION: 1. Hypoattenuation within the left caudate head and anterior lentiform nucleus may represent a late acute to subacute infarction. This can be further characterized with MRI of the head. 2. Progression of mild chronic microvascular ischemic changes and volume loss of the brain from prior studies. These results will be called to the ordering clinician or representative by the Radiologist Assistant, and communication documented in the PACS or zVision Dashboard. Electronically Signed   By: Kristine Garbe M.D.   On: 07/18/2018 22:59   Dg Chest Port 1 View  Result Date: 07/15/2018 CLINICAL DATA:  Chest pain EXAM: PORTABLE CHEST 1 VIEW COMPARISON:  07/14/2018, 02/09/2015 FINDINGS: Chronic elevation of the right diaphragm with minimal atelectasis at the right base. No pleural effusion. Stable borderline enlarged cardiomediastinal silhouette. No pneumothorax. IMPRESSION: No active disease. Chronically elevated right diaphragm with subsegmental atelectasis Electronically Signed   By: Donavan Foil M.D.   On: 07/15/2018 18:23   Dg Chest Port 1 View  Result Date: 07/14/2018 CLINICAL DATA:  Hypertensive emergency EXAM: PORTABLE CHEST 1 VIEW COMPARISON:  02/09/2015 chest radiograph. FINDINGS: Low lung volumes. Stable cardiomediastinal silhouette with normal heart size. No pneumothorax. No pleural effusion. Mild bibasilar atelectasis. No pulmonary edema. IMPRESSION: Low lung volumes with mild bibasilar atelectasis. Electronically Signed   By: Ilona Sorrel M.D.   On: 07/14/2018 02:39      Subjective: Patient denies complaints.  No headache, chest pain, dyspnea, dizziness or  lightheadedness reported.  As per RN, no acute issues noted.  Discharge Exam:  Vitals:   07/20/18 0753 07/20/18 1017 07/20/18 1135 07/20/18 1527  BP: (!) 158/99 (!) 131/107 (!) 154/104 (!) 147/107  Pulse: 99  94 98  Resp: 18  18 18   Temp: 98.7 F (37.1 C)  98.6 F (37 C) 98.7 F (37.1 C)  TempSrc: Oral  Oral Oral  SpO2: 100%  100% 100%  Weight:      Height:        General exam: Pleasant young male, moderately built and nourished sitting up comfortably in bed without distress. Respiratory system: Clear to auscultation. Respiratory effort normal.  No chest wall tenderness today. Cardiovascular system: S1 & S2 heard, RRR. No JVD, murmurs, rubs, gallops or clicks. No pedal edema.    Telemetry personally reviewed: Mostly sinus rhythm.  Occasional sinus bradycardia in the 50s. Gastrointestinal system: Abdomen is nondistended, soft and nontender. No organomegaly or masses felt. Normal bowel sounds heard. Central nervous system: Alert and oriented. No focal neurological deficits. Extremities: Symmetric 5 x 5 power. Skin: No rashes, lesions or ulcers Psychiatry: Judgement and  insight appear normal. Mood & affect appropriate.     The results of significant diagnostics from this hospitalization (including imaging, microbiology, ancillary and laboratory) are listed below for reference.     Microbiology: Recent Results (from the past 240 hour(s))  MRSA PCR Screening     Status: Abnormal   Collection Time: 07/14/18  1:51 AM  Result Value Ref Range Status   MRSA by PCR POSITIVE (A) NEGATIVE Final    Comment:        The GeneXpert MRSA Assay (FDA approved for NASAL specimens only), is one component of a comprehensive MRSA colonization surveillance program. It is not intended to diagnose MRSA infection nor to guide or monitor treatment for MRSA infections. RESULT CALLED TO, READ BACK BY AND VERIFIED WITH: A HAMAD RN 0421 07/14/18 A BROWNING Performed at [facility], [place] [address], [place], [place] [address]      Labs: CBC: Recent Labs  Lab 07/14/18 0223 07/17/18 0545 07/18/18 0440 07/19/18 0438  WBC 8.3 7.4 8.2 13.2*  NEUTROABS 6.2  --   --   --   HGB 14.0 13.4 13.4 14.8  HCT 43.8 43.0 42.3 45.9  MCV 72.4* 73.5* 72.6* 71.4*  PLT 192 204 195 798   Basic Metabolic Panel: Recent Labs  Lab 07/14/18 0223 07/16/18 0504 07/17/18 0545 07/18/18 0440 07/19/18 0438 07/20/18 0549  NA 137 138 138 135 134* 136  K 4.0 4.8 4.1 3.7 4.0 3.9  CL 103 105 102 102 101 101  CO2 23 24 28 29 23 26   GLUCOSE 105* 90 90 91 122* 101*  BUN 13 31* 24* 23* 21* 25*  CREATININE 1.39* 1.78* 1.75* 1.64* 1.67* 1.79*  CALCIUM 9.7 8.9 9.2 8.8* 9.5 9.9  MG 2.0  --   --  2.0 2.2  --   PHOS 4.5  --   --   --   --   --    Liver Function Tests: Recent Labs  Lab 07/14/18 0223  AST 15  ALT 15  ALKPHOS 86  BILITOT 0.7  PROT 7.8  ALBUMIN 4.0   BNP (last 3 results) Recent Labs    07/14/18 0223  BNP 30.2   Cardiac Enzymes: Recent Labs  Lab 07/14/18 0223 07/14/18 0719 07/14/18 1340 07/15/18 2017  TROPONINI <0.03 <0.03 <0.03 <0.03   CBG: Recent Labs  Lab 07/16/18 0815  GLUCAP 80   Urinalysis    Component Value Date/Time   COLORURINE YELLOW 07/17/2018 2115   APPEARANCEUR CLEAR 07/17/2018 2115   LABSPEC 1.014 07/17/2018 2115   PHURINE 5.0 07/17/2018 2115   GLUCOSEU NEGATIVE 07/17/2018 2115   HGBUR NEGATIVE 07/17/2018 2115   HGBUR negative 12/27/2007 0000   BILIRUBINUR NEGATIVE 07/17/2018 2115   KETONESUR NEGATIVE 07/17/2018 2115   PROTEINUR NEGATIVE 07/17/2018 2115   UROBILINOGEN 0.2 02/09/2015 1450   NITRITE NEGATIVE 07/17/2018 2115   LEUKOCYTESUR NEGATIVE 07/17/2018 2115    I discussed in detail with patient's fianc at patient's request.  Updated care and answered all questions.  Time coordinating discharge: 40 minutes  SIGNED:  Vernell Leep, MD, FACP, [facility]. [facility] Pager [phone]  If 7PM-7AM, please contact  night-coverage [email or web address] Password [email or web address] 07/20/2018, 3:32 PM

## 2018-07-20 NOTE — Progress Notes (Signed)
Patient is sitting up in chair, refuse chair alarms, RN educated pt on importance of alarm but patient still refused, say he will call staff if needed.  Call light given to patient.

## 2018-07-20 NOTE — Discharge Instructions (Signed)

## 2018-07-20 NOTE — Progress Notes (Signed)
[facility] will sign off.   Medication Recommendations:/Other recommendations (labs, testing, etc): See consult note Follow up as an outpatient:  PRN

## 2018-07-30 DIAGNOSIS — G4730 Sleep apnea, unspecified: Secondary | ICD-10-CM | POA: Diagnosis not present

## 2018-07-31 ENCOUNTER — Ambulatory Visit: Payer: BLUE CROSS/BLUE SHIELD | Admitting: Family Medicine

## 2018-07-31 ENCOUNTER — Encounter: Payer: Self-pay | Admitting: Family Medicine

## 2018-07-31 VITALS — BP 112/72 | Temp 98.3°F | Ht 72.0 in | Wt 197.2 lb

## 2018-07-31 DIAGNOSIS — I10 Essential (primary) hypertension: Secondary | ICD-10-CM

## 2018-07-31 DIAGNOSIS — Z1329 Encounter for screening for other suspected endocrine disorder: Secondary | ICD-10-CM | POA: Diagnosis not present

## 2018-07-31 DIAGNOSIS — F5101 Primary insomnia: Secondary | ICD-10-CM

## 2018-07-31 DIAGNOSIS — R5383 Other fatigue: Secondary | ICD-10-CM

## 2018-07-31 DIAGNOSIS — E785 Hyperlipidemia, unspecified: Secondary | ICD-10-CM | POA: Diagnosis not present

## 2018-07-31 MED ORDER — HYDRALAZINE HCL 50 MG PO TABS: ORAL_TABLET | ORAL | 0 refills | Status: DC

## 2018-07-31 NOTE — Progress Notes (Signed)
   Subjective:    Patient ID: [name], male    DOB: [birth year], 57 y.o.   MRN: [id number]  HPI Pt here for hospital follow up. Pt went to [facility] on 07/12/2018 then went to [facility] in [place]. Pt went in for migraines, then his BP was high. Pt would like dose changed to help him not be SOB; frequent chills followed by burning up for about 2 days; chest pain when pt gets excited; tightness in chest when laughing;   wife wants to know if any signs of pneumonia or acid reflux.  No cough feeling chills  Complete hospital record reviewed in presence of patient.  The patient had challenges with declaring depression at first hospitalization in [place], actually claims he has 0 depression at this time.  In fact he is adamant that he will not take any antidepressant medication.  In addition his fiance states he absolutely does not need them.  Patient was asked about cocaine and marijuana in his drug screen claims that the marijuana came in through events from another apartment and the cocaine must have been a contaminant  Notes lightheadedness when he stands and feels hydralazine as it too strong of the dose   Review of Systems No headache, no major weight loss or weight gain, no chest pain no back pain abdominal pain no change in bowel habits complete ROS otherwise negative     Objective:   Physical Exam  Alert and oriented, somewhat anxious appearing, O2 sat 98%, vitals reviewed and stable, NAD ENT-TM's and ext canals WNL bilat via otoscopic exam Soft palate, tonsils and post pharynx WNL via oropharyngeal exam Neck-symmetric, no masses; thyroid nonpalpable and nontender Pulmonary-no tachypnea or accessory muscle use; Clear without wheezes via auscultation Card--no abnrml murmurs, rhythm reg and rate WNL Carotid pulses symmetric, without bruits       Assessment & Plan:  Impression status post hospitalization for headache and chest pain and hypertension  out-of-control, along with elements of depression and potential drug abuse.  Patient adamantly claims no difficulty with drug abuse or depression.  He feels his hydralazine is too strong and he may be right.  We will back off on the dose of this.  Follow-up as scheduled.  Blood work to follow-up as scheduled also

## 2018-08-01 LAB — CBC WITH DIFFERENTIAL/PLATELET
BASOS: 0 %
Basophils Absolute: 0 10*3/uL (ref 0.0–0.2)
EOS (ABSOLUTE): 0.3 10*3/uL (ref 0.0–0.4)
Eos: 4 %
HEMOGLOBIN: 13.4 g/dL (ref 13.0–17.7)
Hematocrit: 40 % (ref 37.5–51.0)
IMMATURE GRANS (ABS): 0 10*3/uL (ref 0.0–0.1)
Immature Granulocytes: 1 %
Lymphocytes Absolute: 0.9 10*3/uL (ref 0.7–3.1)
Lymphs: 14 %
MCH: 22.7 pg — AB (ref 26.6–33.0)
MCHC: 33.5 g/dL (ref 31.5–35.7)
MCV: 68 fL — AB (ref 79–97)
MONOCYTES: 7 %
Monocytes Absolute: 0.5 10*3/uL (ref 0.1–0.9)
NEUTROS ABS: 5.2 10*3/uL (ref 1.4–7.0)
Neutrophils: 74 %
Platelets: 214 10*3/uL (ref 150–450)
RBC: 5.91 x10E6/uL — ABNORMAL HIGH (ref 4.14–5.80)
RDW: 14.7 % (ref 12.3–15.4)
WBC: 6.9 10*3/uL (ref 3.4–10.8)

## 2018-08-01 LAB — BASIC METABOLIC PANEL
BUN/Creatinine Ratio: 8 — ABNORMAL LOW (ref 9–20)
BUN: 17 mg/dL (ref 6–24)
CO2: 25 mmol/L (ref 20–29)
CREATININE: 2.09 mg/dL — AB (ref 0.76–1.27)
Calcium: 9.8 mg/dL (ref 8.7–10.2)
Chloride: 100 mmol/L (ref 96–106)
GFR, EST AFRICAN AMERICAN: 39 mL/min/{1.73_m2} — AB (ref >59)
GFR, EST NON AFRICAN AMERICAN: 34 mL/min/{1.73_m2} — AB (ref >59)
Glucose: 103 mg/dL — ABNORMAL HIGH (ref 65–99)
Potassium: 3.7 mmol/L (ref 3.5–5.2)
Sodium: 142 mmol/L (ref 134–144)

## 2018-08-01 LAB — TSH: TSH: 0.988 u[IU]/mL (ref 0.450–4.500)

## 2018-08-03 ENCOUNTER — Other Ambulatory Visit: Payer: Self-pay | Admitting: Family Medicine

## 2018-08-03 DIAGNOSIS — Z125 Encounter for screening for malignant neoplasm of prostate: Secondary | ICD-10-CM | POA: Diagnosis not present

## 2018-08-03 DIAGNOSIS — I10 Essential (primary) hypertension: Secondary | ICD-10-CM

## 2018-08-04 LAB — BASIC METABOLIC PANEL
BUN / CREAT RATIO: 14 (ref 9–20)
BUN: 21 mg/dL (ref 6–24)
CO2: 22 mmol/L (ref 20–29)
Calcium: 9.6 mg/dL (ref 8.7–10.2)
Chloride: 101 mmol/L (ref 96–106)
Creatinine, Ser: 1.47 mg/dL — ABNORMAL HIGH (ref 0.76–1.27)
GFR calc Af Amer: 60 mL/min/{1.73_m2} (ref >59)
GFR, EST NON AFRICAN AMERICAN: 52 mL/min/{1.73_m2} — AB (ref >59)
GLUCOSE: 102 mg/dL — AB (ref 65–99)
POTASSIUM: 4.4 mmol/L (ref 3.5–5.2)
SODIUM: 140 mmol/L (ref 134–144)

## 2018-08-04 LAB — PSA: Prostate Specific Ag, Serum: 13.6 ng/mL — ABNORMAL HIGH (ref 0.0–4.0)

## 2018-08-08 DIAGNOSIS — Z029 Encounter for administrative examinations, unspecified: Secondary | ICD-10-CM

## 2018-08-14 ENCOUNTER — Telehealth: Payer: Self-pay | Admitting: Family Medicine

## 2018-08-14 NOTE — Telephone Encounter (Signed)
[facility] with [facility] requesting pts last 3 PSA levels and office notes of last visit. Pt has an appt with them 08/15/18. Fax number [phone].

## 2018-08-15 ENCOUNTER — Ambulatory Visit: Payer: BLUE CROSS/BLUE SHIELD | Admitting: Urology

## 2018-08-15 ENCOUNTER — Other Ambulatory Visit (HOSPITAL_COMMUNITY)
Admission: RE | Admit: 2018-08-15 | Discharge: 2018-08-15 | Disposition: A | Discharge status: Home / Self Care | Payer: BLUE CROSS/BLUE SHIELD | Source: Other Acute Inpatient Hospital | Attending: Urology | Admitting: Urology

## 2018-08-15 DIAGNOSIS — R972 Elevated prostate specific antigen [PSA]: Secondary | ICD-10-CM

## 2018-08-15 DIAGNOSIS — N400 Enlarged prostate without lower urinary tract symptoms: Secondary | ICD-10-CM

## 2018-08-16 ENCOUNTER — Telehealth: Payer: Self-pay | Admitting: Family Medicine

## 2018-08-16 ENCOUNTER — Ambulatory Visit (INDEPENDENT_AMBULATORY_CARE_PROVIDER_SITE_OTHER): Payer: BLUE CROSS/BLUE SHIELD | Admitting: Family Medicine

## 2018-08-16 ENCOUNTER — Encounter: Payer: Self-pay | Admitting: Family Medicine

## 2018-08-16 VITALS — BP 158/90 | Ht 72.0 in | Wt 199.0 lb

## 2018-08-16 DIAGNOSIS — I10 Essential (primary) hypertension: Secondary | ICD-10-CM

## 2018-08-16 DIAGNOSIS — N189 Chronic kidney disease, unspecified: Secondary | ICD-10-CM

## 2018-08-16 MED ORDER — ATENOLOL 25 MG PO TABS: ORAL_TABLET | ORAL | 2 refills | Status: DC

## 2018-08-16 NOTE — Progress Notes (Signed)
   Subjective:    Patient ID: [name], male    DOB: Aug 29, 1961, 57 y.o.   MRN: [id number]  HPI   Patient is here today to follow up on his bp. He state he had a recent hospitalization due to unstable blood pressure. He states he feels some what better.   He states he came off of the hydralazine due to cold chills.Since stopping cold chills stopped patient states he cannot take hydralazine so he has stopped it.  . Patient states he dropped off the FMLA forms two weeks ago.  Patient went to see urologist.  Being worked up for very elevated PSA.  Patient's urologist told him he needs to start seeing a nephrologist.  Of note we did follow the patient's creatinine.  Was blood work is improved considerably.  Patient states his mood is stable.  In the process of moving to another location with his current girlfriend     Review of Systems No headache, no major weight loss or weight gain, no chest pain no back pain abdominal pain no change in bowel habits complete ROS otherwise negative     Objective:   Physical Exam   Alert and oriented, vitals reviewed and stable, NAD ENT-TM's and ext canals WNL bilat via otoscopic exam Soft palate, tonsils and post pharynx WNL via oropharyngeal exam Neck-symmetric, no masses; thyroid nonpalpable and nontender Pulmonary-no tachypnea or accessory muscle use; Clear without wheezes via auscultation Card--no abnrml murmurs, rhythm reg and rate WNL Carotid pulses symmetric, without bruits      Assessment & Plan:  Impression hypertension.  Suboptimum control.  Perceived side effects.  So therefore stopped hydralazine.  Will start low-dose atenolol rationale discussed with patient  2.  History depression patient claims stable  3.  Chronic renal disease.  Patient's urologist is advised nephrologist.  Will honor this recommendation, though patient's GFR is higher than my usual threshold for referral discussed with patient  Follow-up as  scheduled  Greater than 50% of this 25 minute face to face visit was spent in counseling and discussion and coordination of care regarding the above diagnosis/diagnosies

## 2018-08-16 NOTE — Telephone Encounter (Signed)
In your office.

## 2018-08-16 NOTE — Telephone Encounter (Signed)
Patient dropped off FMLA 08/02/18 but had to wait on follow up visit to complete paper work. Please review form,date,sign must be faxed by 10/31. In your yellow folder for review and signature.

## 2018-08-19 LAB — MISC LABCORP TEST (SEND OUT): Labcorp test code: 489160

## 2018-08-22 ENCOUNTER — Encounter: Payer: Self-pay | Admitting: Family Medicine

## 2018-08-30 DIAGNOSIS — G4730 Sleep apnea, unspecified: Secondary | ICD-10-CM | POA: Diagnosis not present

## 2018-09-29 DIAGNOSIS — J449 Chronic obstructive pulmonary disease, unspecified: Secondary | ICD-10-CM | POA: Diagnosis not present

## 2018-10-05 DIAGNOSIS — M791 Myalgia, unspecified site: Secondary | ICD-10-CM | POA: Diagnosis not present

## 2018-10-05 DIAGNOSIS — G43719 Chronic migraine without aura, intractable, without status migrainosus: Secondary | ICD-10-CM | POA: Diagnosis not present

## 2018-10-05 DIAGNOSIS — G518 Other disorders of facial nerve: Secondary | ICD-10-CM | POA: Diagnosis not present

## 2018-10-05 DIAGNOSIS — R51 Headache: Secondary | ICD-10-CM | POA: Diagnosis not present

## 2018-10-05 DIAGNOSIS — M542 Cervicalgia: Secondary | ICD-10-CM | POA: Diagnosis not present

## 2018-10-30 DIAGNOSIS — G4730 Sleep apnea, unspecified: Secondary | ICD-10-CM | POA: Diagnosis not present

## 2018-11-13 ENCOUNTER — Encounter: Payer: Self-pay | Admitting: Family Medicine

## 2018-11-13 ENCOUNTER — Ambulatory Visit: Payer: BLUE CROSS/BLUE SHIELD | Admitting: Family Medicine

## 2018-11-13 VITALS — BP 168/100 | Ht 72.0 in | Wt 207.8 lb

## 2018-11-13 DIAGNOSIS — F321 Major depressive disorder, single episode, moderate: Secondary | ICD-10-CM

## 2018-11-13 DIAGNOSIS — I10 Essential (primary) hypertension: Secondary | ICD-10-CM

## 2018-11-13 DIAGNOSIS — I120 Hypertensive chronic kidney disease with stage 5 chronic kidney disease or end stage renal disease: Secondary | ICD-10-CM | POA: Insufficient documentation

## 2018-11-13 DIAGNOSIS — N185 Chronic kidney disease, stage 5: Secondary | ICD-10-CM

## 2018-11-13 MED ORDER — AMLODIPINE BESYLATE 5 MG PO TABS: 5.0000 mg | ORAL_TABLET | Freq: Every day | ORAL | 1 refills | Status: DC

## 2018-11-13 MED ORDER — ATENOLOL 25 MG PO TABS: ORAL_TABLET | ORAL | 5 refills | Status: DC

## 2018-11-13 MED ORDER — HYDRALAZINE HCL 50 MG PO TABS: ORAL_TABLET | ORAL | 5 refills | Status: DC

## 2018-11-13 MED ORDER — ALPRAZOLAM 1 MG PO TABS: ORAL_TABLET | ORAL | 5 refills | Status: DC

## 2018-11-13 MED ORDER — FLUOXETINE HCL 20 MG PO TABS: 40.0000 mg | ORAL_TABLET | Freq: Every day | ORAL | 5 refills | Status: DC

## 2018-11-13 NOTE — Progress Notes (Signed)
   Subjective:    Patient ID: [name], male    DOB: May 28, 1961, 58 y.o.   MRN: [id number] Patient presents with numerous concerns Hypertension  This is a chronic problem. The current episode started more than 1 year ago. Risk factors for coronary artery disease include male gender. Treatments tried: norvasc and atenolol. There are no compliance problems.  Hypertensive end-organ damage includes kidney disease.    Blood pressure medicine and blood pressure levels reviewed today with patient. Compliant with blood pressure medicine. States does not miss a dose. No obvious side effects. Blood pressure generally good when checked elsewhere. Watching salt intake.  Patient did well with a couple blood pressure medications.  He states because there were no refills.  However there are some inconsistencies in his history.  For one the last visit he presented stating he had stopped hydralazine because of feeling chilled.  Patient is now back on hydralazine  Has not seen kidney doc yet as requested  pts psa rising. Aw the urologist, they recno biopsy for now, they have done oe in the past   Kid doc due to see in feb  Patient notes ongoing compliance with antidepressant medication. No obvious side effects. Reports does not miss a dose. Overall continues to help depression substantially. No thoughts of homicide or suicide. Would like to maintain medication.   Review of Systems No headache, no major weight loss or weight gain, no chest pain no back pain abdominal pain no change in bowel habits complete ROS otherwise negative     Objective:   Physical Exam  Alert and oriented, vitals reviewed and stable, NAD ENT-TM's and ext canals WNL bilat via otoscopic exam Soft palate, tonsils and post pharynx WNL via oropharyngeal exam Neck-symmetric, no masses; thyroid nonpalpable and nontender Pulmonary-no tachypnea or accessory muscle use; Clear without wheezes via auscultation Card--no abnrml  murmurs, rhythm reg and rate WNL Carotid pulses symmetric, without bruits       Assessment & Plan:  Impression hypertension.  Suboptimum.  With noncompliance complicating things.  See above.  Patient asked to maintain the hydralazine.  He stated he was only taking twice a day.  Encouraged to go back up to 1 3:30 times per day.  Also add nightly Norvasc 5 mg.  Stop atenolol  2.  Elevated PSA.  Did get back to see urologist claimed the urologist had no recommendations as far as follow-up.  Relatively stable with no biopsy at this time.  Encourage patient seen urologist regularly going forward  3.  Chronic kidney disease.  Likely blood pressure related.  Has not followed up with nephrologist yet as requested.  This is pending for February.  Once again of asked him to have them maintain his blood pressure medications ideally  4.  Chronic depression and anxiety with ongoing need for meds meds refilled  Follow-up in 6 months diet exercise discussed

## 2018-11-30 DIAGNOSIS — G4730 Sleep apnea, unspecified: Secondary | ICD-10-CM | POA: Diagnosis not present

## 2018-12-29 DIAGNOSIS — G4730 Sleep apnea, unspecified: Secondary | ICD-10-CM | POA: Diagnosis not present

## 2019-01-05 DIAGNOSIS — M542 Cervicalgia: Secondary | ICD-10-CM | POA: Diagnosis not present

## 2019-01-05 DIAGNOSIS — G518 Other disorders of facial nerve: Secondary | ICD-10-CM | POA: Diagnosis not present

## 2019-01-05 DIAGNOSIS — G43719 Chronic migraine without aura, intractable, without status migrainosus: Secondary | ICD-10-CM | POA: Diagnosis not present

## 2019-01-05 DIAGNOSIS — M791 Myalgia, unspecified site: Secondary | ICD-10-CM | POA: Diagnosis not present

## 2019-01-29 DIAGNOSIS — G4730 Sleep apnea, unspecified: Secondary | ICD-10-CM | POA: Diagnosis not present

## 2019-02-28 DIAGNOSIS — G4730 Sleep apnea, unspecified: Secondary | ICD-10-CM | POA: Diagnosis not present

## 2019-03-31 DIAGNOSIS — G4730 Sleep apnea, unspecified: Secondary | ICD-10-CM | POA: Diagnosis not present

## 2019-04-30 DIAGNOSIS — G4730 Sleep apnea, unspecified: Secondary | ICD-10-CM | POA: Diagnosis not present

## 2019-05-25 ENCOUNTER — Ambulatory Visit (INDEPENDENT_AMBULATORY_CARE_PROVIDER_SITE_OTHER): Payer: BC Managed Care – PPO | Admitting: Family Medicine

## 2019-05-25 ENCOUNTER — Other Ambulatory Visit: Payer: Self-pay

## 2019-05-25 DIAGNOSIS — R972 Elevated prostate specific antigen [PSA]: Secondary | ICD-10-CM | POA: Diagnosis not present

## 2019-05-25 DIAGNOSIS — G43719 Chronic migraine without aura, intractable, without status migrainosus: Secondary | ICD-10-CM | POA: Diagnosis not present

## 2019-05-25 DIAGNOSIS — N189 Chronic kidney disease, unspecified: Secondary | ICD-10-CM | POA: Diagnosis not present

## 2019-05-25 DIAGNOSIS — I10 Essential (primary) hypertension: Secondary | ICD-10-CM | POA: Diagnosis not present

## 2019-05-25 DIAGNOSIS — M542 Cervicalgia: Secondary | ICD-10-CM | POA: Diagnosis not present

## 2019-05-25 DIAGNOSIS — F321 Major depressive disorder, single episode, moderate: Secondary | ICD-10-CM | POA: Diagnosis not present

## 2019-05-25 DIAGNOSIS — M791 Myalgia, unspecified site: Secondary | ICD-10-CM | POA: Diagnosis not present

## 2019-05-25 DIAGNOSIS — G518 Other disorders of facial nerve: Secondary | ICD-10-CM | POA: Diagnosis not present

## 2019-05-25 MED ORDER — AMLODIPINE BESYLATE 5 MG PO TABS: 5.0000 mg | ORAL_TABLET | Freq: Every day | ORAL | 1 refills | Status: DC

## 2019-05-25 MED ORDER — HYDRALAZINE HCL 50 MG PO TABS: ORAL_TABLET | ORAL | 1 refills | Status: DC

## 2019-05-25 MED ORDER — FLUOXETINE HCL 20 MG PO TABS: 40.0000 mg | ORAL_TABLET | Freq: Every day | ORAL | 1 refills | Status: DC

## 2019-05-25 MED ORDER — ALPRAZOLAM 1 MG PO TABS: ORAL_TABLET | ORAL | 5 refills | Status: DC

## 2019-05-25 NOTE — Progress Notes (Signed)
   Subjective:    Patient ID: [name], male    DOB: Mar 07, 1961, 58 y.o.   MRN: [id number]  Hypertension This is a chronic problem. There are no compliance problems.    Pt states he is confused about what meds to be taking. Pt states is amlodipine was stopped   Pt states he is out of Prozac because he was not getting it.  Virtual Visit via Video Note  I connected with [name] on 05/25/19 at  8:30 AM EDT by a video enabled telemedicine application and verified that I am speaking with the correct person using two identifiers.  Location: Patient: home Provider: office   I discussed the limitations of evaluation and management by telemedicine and the availability of in person appointments. The patient expressed understanding and agreed to proceed.  History of Present Illness:    Observations/Objective:   Assessment and Plan:   Follow Up Instructions:    I discussed the assessment and treatment plan with the patient. The patient was provided an opportunity to ask questions and all were answered. The patient agreed with the plan and demonstrated an understanding of the instructions.   The patient was advised to call back or seek an in-person evaluation if the symptoms worsen or if the condition fails to improve as anticipated.  I provided25 minutes of non-face-to-face time during this encounter.  Blood pressure medicine and blood pressure levels reviewed today with patient. Compliant with blood pressure medicine. States does not miss a dose. No obvious side effects. Blood pressure generally good when checked elsewhere. Watching salt intake.   Patient notes ongoing compliance with antidepressant medication. No obvious side effects. Reports does not miss a dose. Overall continues to help depression substantially. No thoughts of homicide or suicide. Would like to maintain medication.   Vicente Males, LPN    Review of Systems No headache, no major weight loss or  weight gain, no chest pain no back pain abdominal pain no change in bowel habits complete ROS otherwise negative     Objective:   Physical Exam   Virtual     Assessment & Plan:  Impression 1 hypertension.  Nonintentional noncompliance.  Patient a bit confused about medication.  I read to him the notes regarding our last visit.  Medication clarified and updated in the medication section.  2.  Depression.  Patient states her Prozac was definitely helping.  Got off of it would like to resume  3.  Coronavirus questions patient has numerous discussed.  Concerned about the setting he is working and currently understandably  4.  Elevated PSA once again patient has not followed up with his urologist.  This is strongly recommended  5.  Chronic renal disease.  Patient has not seen his nephrologist.  Since his creatinine at this time is stable and since he has apparent enormous difficulty following up with specialists advised patient we will keep an eye on his creatinine importance of good blood pressure control discussed once again

## 2019-05-27 ENCOUNTER — Encounter: Payer: Self-pay | Admitting: Family Medicine

## 2019-05-30 ENCOUNTER — Ambulatory Visit: Payer: BLUE CROSS/BLUE SHIELD | Admitting: Family Medicine

## 2019-05-31 DIAGNOSIS — G4730 Sleep apnea, unspecified: Secondary | ICD-10-CM | POA: Diagnosis not present

## 2019-06-09 ENCOUNTER — Other Ambulatory Visit: Payer: Self-pay

## 2019-06-09 DIAGNOSIS — Z20822 Contact with and (suspected) exposure to covid-19: Secondary | ICD-10-CM

## 2019-06-10 LAB — NOVEL CORONAVIRUS, NAA: SARS-CoV-2, NAA: NOT DETECTED

## 2019-06-11 ENCOUNTER — Telehealth: Payer: Self-pay | Admitting: Family Medicine

## 2019-06-11 NOTE — Telephone Encounter (Signed)
Patient is calling to receive his negative COVID results. Patient expressed understanding. °

## 2019-07-01 DIAGNOSIS — G4730 Sleep apnea, unspecified: Secondary | ICD-10-CM | POA: Diagnosis not present

## 2019-07-31 DIAGNOSIS — G4730 Sleep apnea, unspecified: Secondary | ICD-10-CM | POA: Diagnosis not present

## 2019-08-31 DIAGNOSIS — G4730 Sleep apnea, unspecified: Secondary | ICD-10-CM | POA: Diagnosis not present

## 2019-09-19 ENCOUNTER — Encounter: Payer: Self-pay | Admitting: Family Medicine

## 2019-09-19 ENCOUNTER — Ambulatory Visit (INDEPENDENT_AMBULATORY_CARE_PROVIDER_SITE_OTHER): Payer: BC Managed Care – PPO | Admitting: Family Medicine

## 2019-09-19 ENCOUNTER — Other Ambulatory Visit: Payer: Self-pay

## 2019-09-19 DIAGNOSIS — S39012A Strain of muscle, fascia and tendon of lower back, initial encounter: Secondary | ICD-10-CM

## 2019-09-19 DIAGNOSIS — M545 Low back pain: Secondary | ICD-10-CM | POA: Diagnosis not present

## 2019-09-19 MED ORDER — CHLORZOXAZONE 500 MG PO TABS: 500.0000 mg | ORAL_TABLET | Freq: Three times a day (TID) | ORAL | 0 refills | Status: DC | PRN

## 2019-09-19 NOTE — Progress Notes (Signed)
   Subjective:  Audio only  Patient ID: [name], male    DOB: 25-Sep-1961, 58 y.o.   MRN: [id number]  HPI  Patient calls with back pain since this weekend. Patient states he moved this weekend and pilled his back and he has been unable to work last several days.  Virtual Visit via Video Note  I connected with [name] on 09/19/19 at  2:30 PM EST by a video enabled telemedicine application and verified that I am speaking with the correct person using two identifiers.  Location: Patient: home Provider: office   I discussed the limitations of evaluation and management by telemedicine and the availability of in person appointments. The patient expressed understanding and agreed to proceed.  History of Present Illness:    Observations/Objective:   Assessment and Plan:   Follow Up Instructions:    I discussed the assessment and treatment plan with the patient. The patient was provided an opportunity to ask questions and all were answered. The patient agreed with the plan and demonstrated an understanding of the instructions.   The patient was advised to call back or seek an in-person evaluation if the symptoms worsen or if the condition fails to improve as anticipated.  I provided minutes of non-face-to-face time during this encounter.  Did a lot of moving  Messes up back  Has had pain coming and going last few yrs   Has had rough pain, low back  Saw the nurse practitioner  Was given a steroid shot and per pt also a dilaudid shot  More on left side near hip  Rad to the thigh, worse with twisting or movements    Review of Systems No cough no fever no congestion    Objective:   Physical Exam  Virtual      Assessment & Plan:  Impression severe lumbar strain.  With element of spasm.  And even potential nerve root irritation.  Uses ibuprofen with fair success.  Had a steroid shot at work.  Will add chlorzoxazone up to 3 times daily.  Local measures  discussed.  Work excuse through Sunday May need to do FMLA

## 2019-09-23 IMAGING — CT CT HEAD W/O CM
3 series · 15 of 47 positions shown, 18 images · non-contrast
Comparison: 11/15/2012 and 01/10/2009 CT of the head.

CLINICAL DATA: 57 y/o M; found on floor. Headache prior to the
fall.

EXAM:
CT HEAD WITHOUT CONTRAST
TECHNIQUE: Contiguous axial images were obtained from the base of the skull
through the vertex without intravenous contrast.

[Series 3: head 5.0 h30s · axial · 0.46mm/px · z∈[-108,+37]mm · 9 of 35 slices shown, 12 images]
[im 3/35  brain]
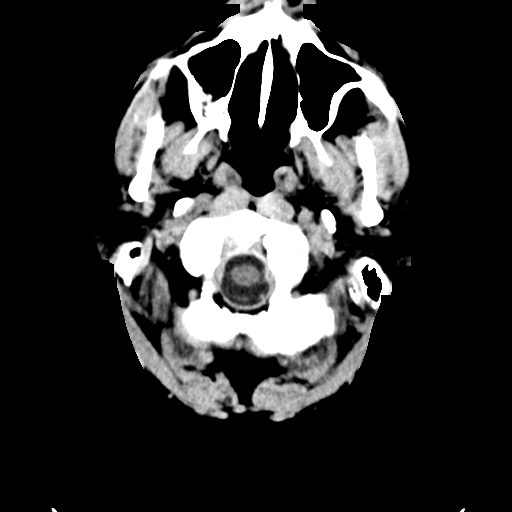
[im 3/35  bone]
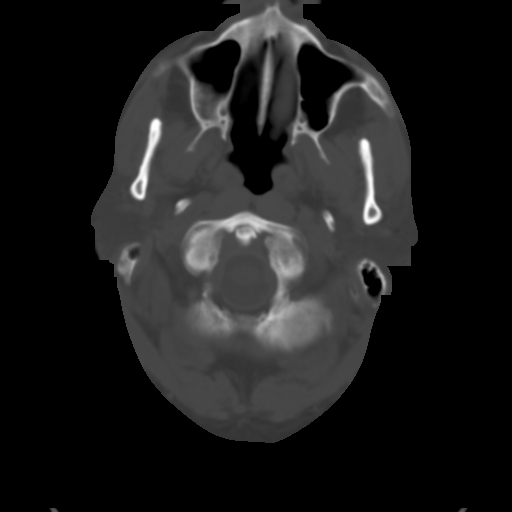
[im 6/35  brain]
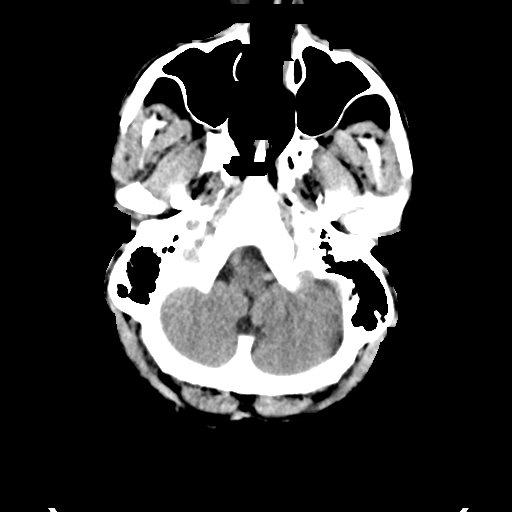
[im 10/35  brain]
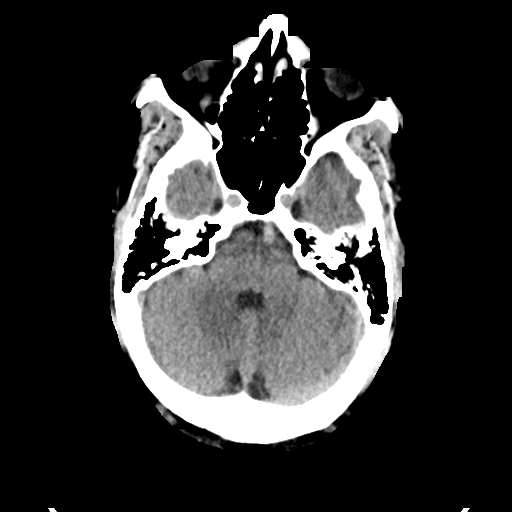
[im 13/35  brain]
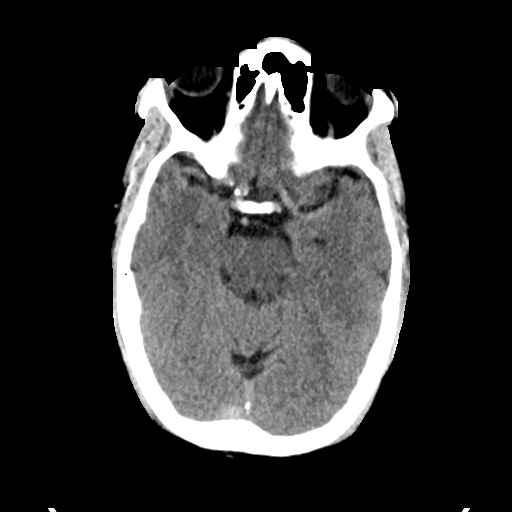
[im 18/35  brain]
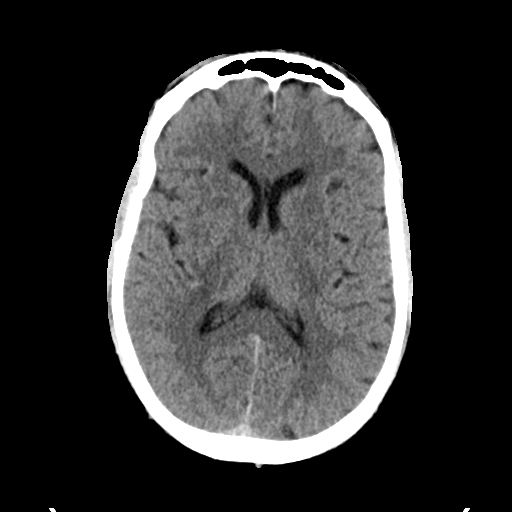
[im 18/35  bone]
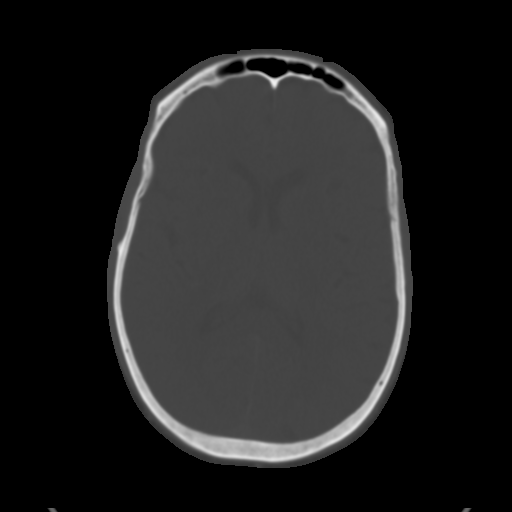
[im 22/35  brain]
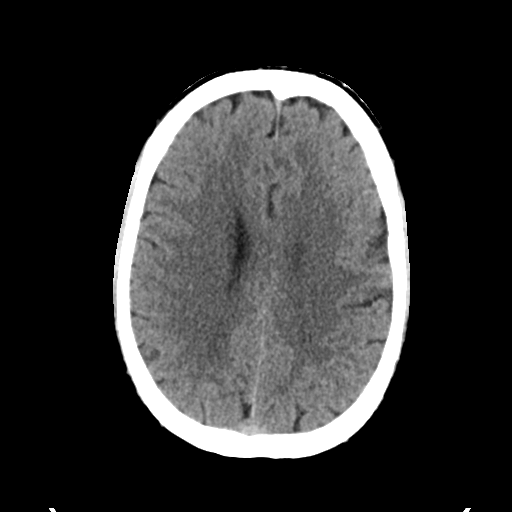
[im 25/35  brain]
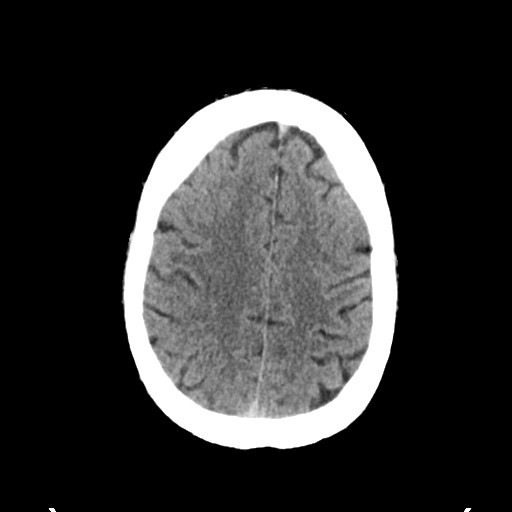
[im 29/35  brain]
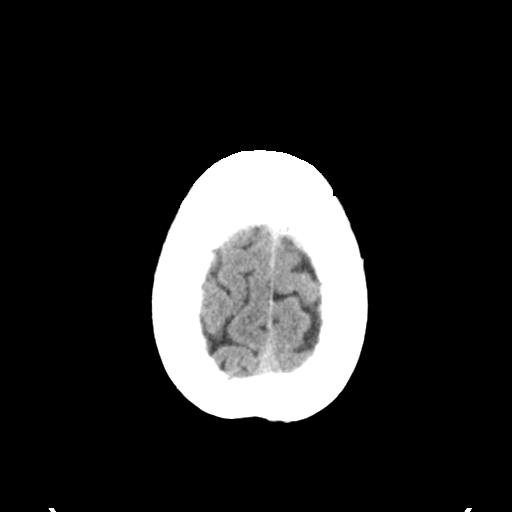
[im 32/35  brain]
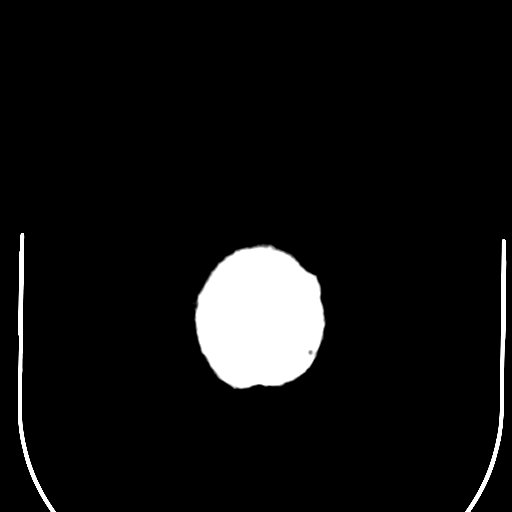
[im 32/35  bone]
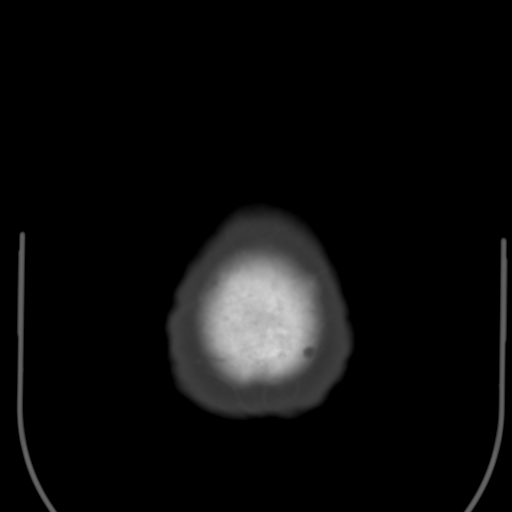

[Series 5: head 3.0 mpr cor · coronal · 0.34mm/px · 3 of 79 slices shown]
[im 27/79  brain]
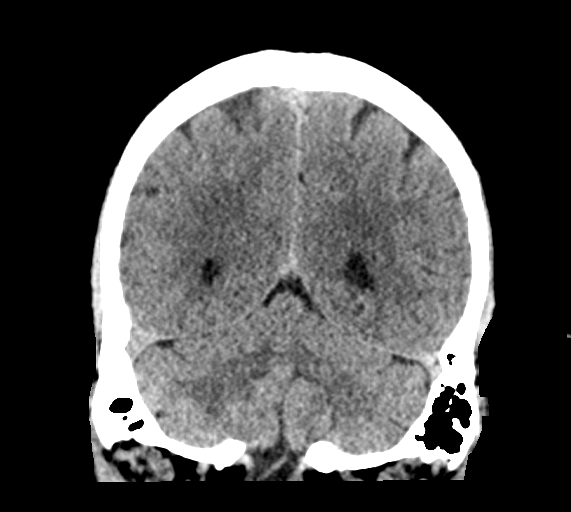
[im 35/79  brain]
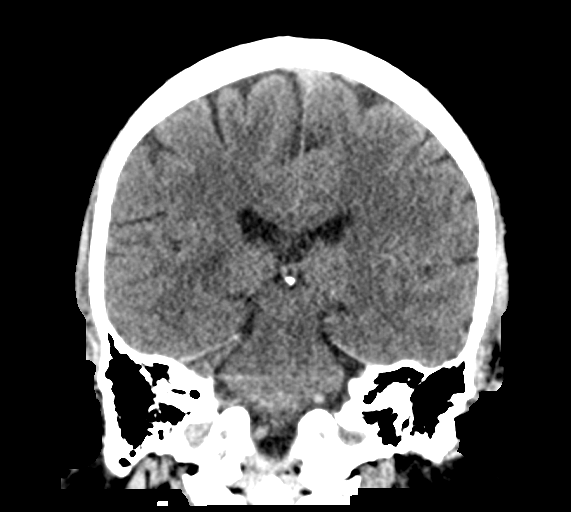
[im 44/79  brain]
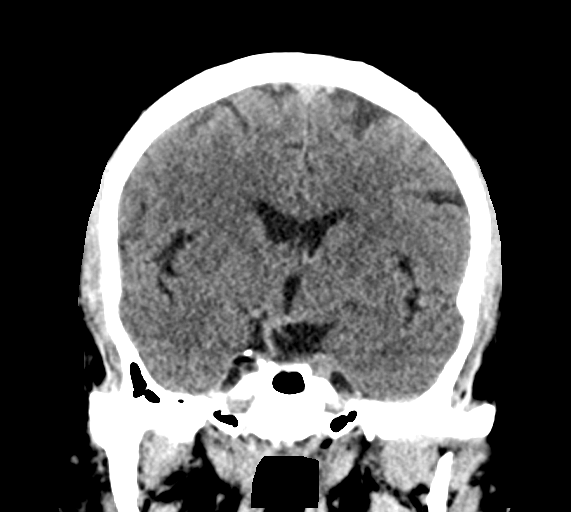

[Series 6: head 3.0 mpr sag · sagittal · 0.34mm/px · 3 of 67 slices shown]
[im 23/67  brain]
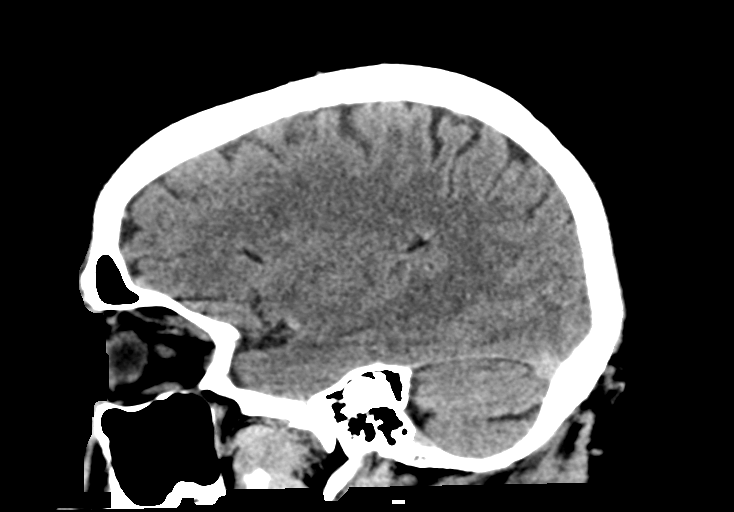
[im 34/67  brain]
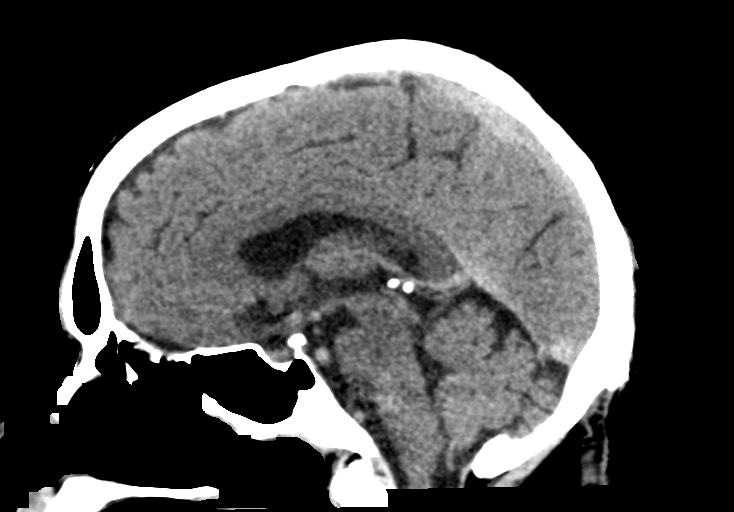
[im 45/67  brain]
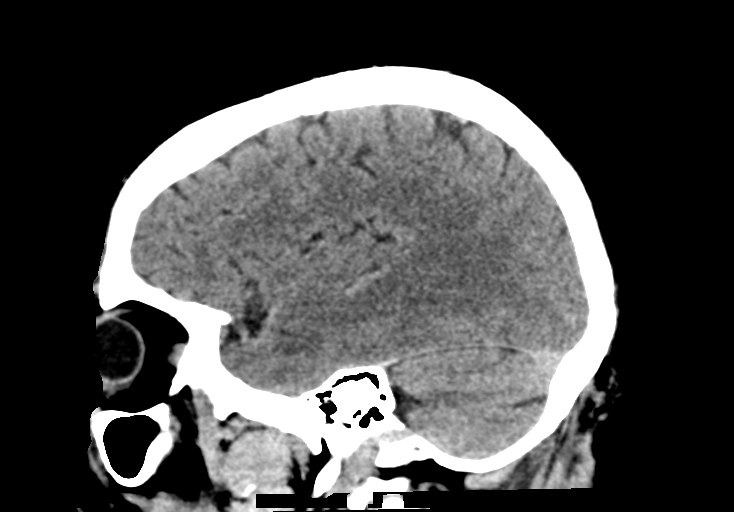

[15 of 47 positions shown; findings below may reference images not displayed]

FINDINGS: Brain: Hypoattenuation within the left caudate head and anterior
lentiform nucleus. No associated mass effect or hemorrhage. Few
nonspecific white matter hypodensities in subcortical and
periventricular white matter are compatible with mild chronic
microvascular ischemic changes for age. Mild volume loss of the
brain. Findings are mildly progressed from prior studies.

Vascular: Mild calcific atherosclerosis of carotid siphons. No
hyperdense vessel identified.

Skull: Normal. Negative for fracture or focal lesion.

Sinuses/Orbits: No acute finding.

Other: None.
IMPRESSION: 1. Hypoattenuation within the left caudate head and anterior
lentiform nucleus may represent a late acute to subacute infarction.
This can be further characterized with MRI of the head.
2. Progression of mild chronic microvascular ischemic changes and
volume loss of the brain from prior studies.

These results will be called to the ordering clinician or
representative by the Radiologist Assistant, and communication
documented in the PACS or zVision Dashboard.

By: Adalid Mandel M.D.

## 2019-09-24 ENCOUNTER — Other Ambulatory Visit: Payer: Self-pay

## 2019-09-24 ENCOUNTER — Encounter: Payer: Self-pay | Admitting: Family Medicine

## 2019-09-24 ENCOUNTER — Ambulatory Visit (INDEPENDENT_AMBULATORY_CARE_PROVIDER_SITE_OTHER): Payer: BC Managed Care – PPO | Admitting: Family Medicine

## 2019-09-24 DIAGNOSIS — M545 Low back pain: Secondary | ICD-10-CM

## 2019-09-24 DIAGNOSIS — S39012A Strain of muscle, fascia and tendon of lower back, initial encounter: Secondary | ICD-10-CM

## 2019-09-24 MED ORDER — PREDNISONE 20 MG PO TABS: ORAL_TABLET | ORAL | 0 refills | Status: DC

## 2019-09-24 NOTE — Progress Notes (Signed)
   Subjective:  Audio only  Patient ID: [name], male    DOB: 22-Dec-1960, 58 y.o.   MRN: [id number]  Back Pain This is a new problem. Episode onset: since Thanksgiving  Pain location: lower back left side. Radiates to: buttock area. The symptoms are aggravated by bending and twisting (walking, going up steps). He has tried ice and heat for the symptoms. The treatment provided mild relief.   Pt also had stated that he has missed some days of work due to back pain and will need FMLA papers filled out.  Virtual Visit via Telephone Note  I connected with [name] on 09/24/19 at  2:00 PM EST by telephone and verified that I am speaking with the correct person using two identifiers.  Location: Patient: home Provider: office   I discussed the limitations, risks, security and privacy concerns of performing an evaluation and management service by telephone and the availability of in person appointments. I also discussed with the patient that there may be a patient responsible charge related to this service. The patient expressed understanding and agreed to proceed.   History of Present Illness:    Observations/Objective:   Assessment and Plan:   Follow Up Instructions:    I discussed the assessment and treatment plan with the patient. The patient was provided an opportunity to ask questions and all were answered. The patient agreed with the plan and demonstrated an understanding of the instructions.   The patient was advised to call back or seek an in-person evaluation if the symptoms worsen or if the condition fails to improve as anticipated.  I provided 33minutes of non-face-to-face time during this encounter.  Back pain got better a bit, then got worse   Didn't take chlorzoxazone   Works physical at work  Thinking about returning to work   Vicente Males, LPN    Review of Systems  Musculoskeletal: Positive for back pain.       Objective:   Physical  Exam   Virtual     Assessment & Plan:  Impression severe lumbar pain with some radiation into the thigh.  Some nerve root irritation evident.  Ibuprofen plus chlorzoxazone not working well.  Add prednisone taper.  Couple weeks work excuse written local measures discussed follow-up in 2 weeks

## 2019-09-30 DIAGNOSIS — G4730 Sleep apnea, unspecified: Secondary | ICD-10-CM | POA: Diagnosis not present

## 2019-10-08 ENCOUNTER — Other Ambulatory Visit: Payer: Self-pay

## 2019-10-08 ENCOUNTER — Ambulatory Visit (INDEPENDENT_AMBULATORY_CARE_PROVIDER_SITE_OTHER): Payer: BC Managed Care – PPO | Admitting: Family Medicine

## 2019-10-08 ENCOUNTER — Encounter: Payer: Self-pay | Admitting: Family Medicine

## 2019-10-08 DIAGNOSIS — M545 Low back pain: Secondary | ICD-10-CM

## 2019-10-08 DIAGNOSIS — S39012A Strain of muscle, fascia and tendon of lower back, initial encounter: Secondary | ICD-10-CM

## 2019-10-08 MED ORDER — ETODOLAC 400 MG PO TABS: 400.0000 mg | ORAL_TABLET | Freq: Two times a day (BID) | ORAL | 0 refills | Status: DC

## 2019-10-08 NOTE — Progress Notes (Signed)
   Subjective:    Patient ID: [name], male    DOB: 04/27/1961, 58 y.o.   MRN: [id number]  HPI  Patient calls to discuss FMLA for his back pain. Patient states his back is still bothering him.  Virtual Visit via Video Note  I connected with [name] on 10/08/19 at  1:10 PM EST by a video enabled telemedicine application and verified that I am speaking with the correct person using two identifiers.  Location: Patient: home Provider: office   I discussed the limitations of evaluation and management by telemedicine and the availability of in person appointments. The patient expressed understanding and agreed to proceed.  History of Present Illness:    Observations/Objective:   Assessment and Plan:   Follow Up Instructions:    I discussed the assessment and treatment plan with the patient. The patient was provided an opportunity to ask questions and all were answered. The patient agreed with the plan and demonstrated an understanding of the instructions.   The patient was advised to call back or seek an in-person evaluation if the symptoms worsen or if the condition fails to improve as anticipated. 20inutes of non-face-to-face time during this encounter.  Patient still experiencing fairly severe lumbar pain.  Some radiation into the thigh.  Very stiff when he first wakes up in the morning.  Difficulty moving around.  States absolutely unable to perform his job.  Cannot even stand for prolonged periods of time.  Pain also severe at times despite current medications      Review of Systems No chest pain no abdominal pain no change in bowel habits no blood in stools    Objective:   Physical Exam Virtual       Assessment & Plan:  Impression subacute lumbar pain.  Recommend lumbar x-ray.  Will prescribe pain medication primarily to use at night discussed.  Physical therapy referral.  Stretch out work excuse several weeks.  Patient has a very physical job and  unable to perform is even regular activities at this time.

## 2019-10-13 ENCOUNTER — Encounter: Payer: Self-pay | Admitting: Family Medicine

## 2019-10-13 MED ORDER — HYDROCODONE-ACETAMINOPHEN 5-325 MG PO TABS: ORAL_TABLET | ORAL | 0 refills | Status: DC

## 2019-10-25 ENCOUNTER — Telehealth: Payer: Self-pay | Admitting: Family Medicine

## 2019-10-25 NOTE — Telephone Encounter (Signed)
Tell pt we have found and will fax Monday to them

## 2019-10-25 NOTE — Telephone Encounter (Signed)
Patient is checking on FMLA from 10/08/19  he had faxed over when he had his phone visit.He was requesting dates from 09/14/2019-11/04/2019. I checked with Autumn she states gave you the paper work day of patient appointment.Please advise

## 2019-10-29 ENCOUNTER — Ambulatory Visit (HOSPITAL_COMMUNITY): Payer: BC Managed Care – PPO | Attending: Family Medicine | Admitting: Physical Therapy

## 2019-10-29 ENCOUNTER — Encounter (HOSPITAL_COMMUNITY): Payer: Self-pay | Admitting: Physical Therapy

## 2019-10-29 ENCOUNTER — Other Ambulatory Visit: Payer: Self-pay

## 2019-10-29 ENCOUNTER — Telehealth: Payer: Self-pay | Admitting: Family Medicine

## 2019-10-29 ENCOUNTER — Other Ambulatory Visit: Payer: Self-pay | Admitting: *Deleted

## 2019-10-29 DIAGNOSIS — M6281 Muscle weakness (generalized): Secondary | ICD-10-CM | POA: Diagnosis not present

## 2019-10-29 DIAGNOSIS — M545 Low back pain, unspecified: Secondary | ICD-10-CM

## 2019-10-29 DIAGNOSIS — Z029 Encounter for administrative examinations, unspecified: Secondary | ICD-10-CM

## 2019-10-29 DIAGNOSIS — R2689 Other abnormalities of gait and mobility: Secondary | ICD-10-CM | POA: Diagnosis not present

## 2019-10-29 MED ORDER — AMLODIPINE BESYLATE 5 MG PO TABS: 5.0000 mg | ORAL_TABLET | Freq: Every day | ORAL | 0 refills | Status: DC

## 2019-10-29 NOTE — Telephone Encounter (Signed)
One refill sent in per dr Richardson Landry and pt was notified. Needs bp check up in February and he scheduled while I had him on the phone

## 2019-10-29 NOTE — Telephone Encounter (Signed)
Patient is requesting refill on amlodipine 5mg   States completely out. [place]

## 2019-10-29 NOTE — Therapy (Signed)
[place], [place], [address] Phone: [phone]   Fax:  [phone]  Physical Therapy Evaluation  Patient Details  Name: [name] MRN: [id number] Date of Birth: 1961/04/11 Referring Provider (PT): Genia Harold MD   Encounter Date: 10/29/2019  PT End of Session - 10/29/19 1210    Visit Number  1    Number of Visits  16    Date for PT Re-Evaluation  12/28/19   Reassess on 11/30/19   Authorization Type  [employer] (No auth req)    Authorization Time Period  10/29/19-12/28/19    PT Start Time  1120    PT Stop Time  1200    PT Time Calculation (min)  40 min    Activity Tolerance  Patient limited by pain    Behavior During Therapy  Restless       Past Medical History:  Diagnosis Date  . Depression   . H/O blood clots 2008  . Hypertension   . Migraine   . Sleep apnea     Past Surgical History:  Procedure Laterality Date  . HERNIA REPAIR    . KNEE SURGERY      There were no vitals filed for this visit.   Subjective Assessment - 10/29/19 1128    Subjective  Patient presents to physical therapy with complaint of low back pain beginning on 09/13/19. Patient says he was moving into a new house and did a lot of lifting which caused his back pain. Patient says he has had some ongoing low back pain prior, ongoing several months, not exactly sure how long, but notes that this increased his pain. Patient denies having imaging. Patient says he was prescribed some pain medication but notes that it was not really helpful. Patient describes pain on LT side of low back into LT buttock area. Patient notes he has been out of work since this injury and that he feels he "cannot work like this".    Limitations  Lifting;Standing;Walking;House hold activities    How long can you sit comfortably?  30 minutes    How long can you stand comfortably?  15-20 minutes    How long can you walk comfortably?  15 minutes    Patient Stated  Goals  No pain    Currently in Pain?  Yes    Pain Score  7     Pain Location  Back    Pain Orientation  Left;Posterior    Pain Descriptors / Indicators  Aching;Dull;Sharp;Shooting    Pain Radiating Towards  Lt buttock    Pain Onset  More than a month ago    Pain Frequency  Constant    Aggravating Factors   bending, lifitng, squatting    Pain Relieving Factors  heat, meds    Effect of Pain on Daily Activities  Limits         [facility] PT Assessment - 10/29/19 0001      Assessment   Medical Diagnosis  Low back pain     Referring Provider (PT)  Genia Harold MD    Onset Date/Surgical Date  09/13/19    Next MD Visit  11/05/2019    Prior Therapy  yes      Precautions   Precautions  None      Restrictions   Weight Bearing Restrictions  No      Balance Screen   Has the patient fallen in the past 6 months  No  Home Environment   Living Environment  Private residence    Living Arrangements  Alone      Prior Function   Level of [place] on leave from [employer] Requirements  bending, lifting, squatting      Cognition   Overall Cognitive Status  Within Functional Limits for tasks assessed      Observation/Other Assessments   Observations  Difficulty staying seated, patient frequently weight shifitng and standing during evalulation    Focus on Therapeutic Outcomes (FOTO)   72% limited      Sensation   Light Touch  Appears Intact      Posture/Postural Control   Posture/Postural Control  Postural limitations    Postural Limitations  Flexed trunk;Weight shift right      ROM / Strength   AROM / PROM / Strength  AROM;Strength      AROM   AROM Assessment Site  Lumbar    Lumbar Flexion  80% limited    Lumbar Extension  100% limited    Lumbar - Right Side Bend  50% limited    Lumbar - Left Side Bend  75% limited      Strength   Strength Assessment Site  Hip;Knee;Ankle    Right/Left Hip  Right;Left    Right  Hip Flexion  4/5    Right Hip Extension  4-/5    Right Hip ABduction  4-/5    Left Hip Flexion  3+/5    Left Hip Extension  3-/5    Left Hip ABduction  3-/5    Right/Left Knee  Right;Left    Right Knee Flexion  4/5    Right Knee Extension  4+/5    Left Knee Flexion  3+/5    Left Knee Extension  4-/5    Right/Left Ankle  Right;Left    Right Ankle Dorsiflexion  4+/5    Left Ankle Dorsiflexion  4/5      Palpation   Palpation comment  Moderate tenderness to palpation about LT low lumbar       Special Tests    Special Tests  --   (+) SLR on LT; (+) sacral compression      Transfers   Transfers  Sit to Stand    Sit to Stand  6: Modified independent (Device/Increase time)    Comments  Slow, labored movment, painful, leans toward RT      Ambulation/Gait   Ambulation/Gait  Yes    Ambulation/Gait Assistance  5: Supervision    Assistive device  4-wheeled walker;None    Gait Pattern  Decreased stride length;Antalgic;Trunk flexed;Decreased weight shift to left;Decreased stance time - left;Decreased step length - left    Ambulation Surface  Level;Indoor                Objective measurements completed on examination: See above findings.              PT Education - 10/29/19 1134    Education Details  Pateitn educated on evaluation findings, POC and HEP    Person(s) Educated  Patient    Methods  Explanation    Comprehension  Verbalized understanding       PT Short Term Goals - 10/29/19 1215      PT SHORT TERM GOAL #1   Title  Patient will be independent with initial HEP to improve functional outcomes    Time  4    Period  Weeks    Status  New    Target Date  11/30/19      PT SHORT TERM GOAL #2   Title  Patient will improve FOTO score to <65 to indicate improvement in functional outcomes    Time  4    Period  Weeks    Status  New    Target Date  11/30/19        PT Long Term Goals - 10/29/19 1216      PT LONG TERM GOAL #1   Title  Patient will  improve FOTO score to <50% to indicate improvement in functional outcomes    Time  8    Period  Weeks    Status  New    Target Date  12/28/19      PT LONG TERM GOAL #2   Title  Patient will report at least 75% overall improvement in subjective complaint to indicate improvement in ability to perform ADLs.    Time  8    Period  Weeks    Status  New    Target Date  12/28/19      PT LONG TERM GOAL #3   Title  Patient will have equal to or > 4+/5 MMT throughout BLE to improve ability to perform functional mobility, stair ambulation, ADLs and work tasks    Time  8    Period  Weeks    Status  New    Target Date  12/28/19      PT LONG TERM GOAL #4   Title  Patient will be able to ambulate at least 450 feet during 2MWT with LRAD to demonstrate improved ability to perform functional mobility and associated tasks.    Time  8    Period  Weeks    Status  New    Target Date  12/28/19             Plan - 10/29/19 1211    Clinical Impression Statement  Patient is a 59 y.o. male who presents to physical therapy with complaint of low back pain. Patient demonstrates decreased strength, ROM restriction, increased tenderness to palpation and gait abnormalities which are likely contributing to symptoms of pain and are negatively impacting patient ability to perform ADLs and functional mobility tasks. Patient will benefit from skilled physical therapy services to address these deficits to reduce pain, improve level of function with ADLs,  and functional mobility tasks.    Examination-Activity Limitations  Bathing;Sit;Sleep;Bed Mobility;Bend;Squat;Stairs;Stand;Carry;Caring for Others;Toileting;Dressing;Transfers;Lift;Locomotion Level    Examination-Participation Restrictions  Other;Yard Work;Community Activity;Driving;Laundry    Stability/Clinical Decision Making  Stable/Uncomplicated    Clinical Decision Making  Low    Rehab Potential  Fair    PT Frequency  2x / week    PT Duration  8 weeks    PT  Treatment/Interventions  ADLs/Self Care Home Management;Biofeedback;Electrical Stimulation;Moist Heat;Balance training;Manual techniques;Therapeutic exercise;Therapeutic activities;Functional mobility training;Stair training;Gait training;DME Instruction;Patient/family education;Orthotic Fit/Training;Neuromuscular re-education;Passive range of motion;Spinal Manipulations;Joint Manipulations;Splinting;Taping    PT Next Visit Plan  Initiate treatment. Review goals and HEP. Add gentle manual treatment to address lumbar pain and restriciton. Progress table exercise as tolerated. Add ab set, glute squeeze, heel slides, SKTC if able.    PT Home Exercise Plan  10/29/19: posterior pelvic tilts    Consulted and Agree with Plan of Care  Patient       Patient will benefit from skilled therapeutic intervention in order to improve the following deficits and impairments:  Abnormal gait, Hypomobility, Decreased activity tolerance,  Decreased strength, Pain, Decreased mobility, Difficulty walking, Decreased range of motion, Improper body mechanics, Impaired flexibility, Postural dysfunction  Visit Diagnosis: Left-sided low back pain without sciatica, unspecified chronicity  Muscle weakness (generalized)  Other abnormalities of gait and mobility     Problem List Patient Active Problem List   Diagnosis Date Noted  . Hypertensive kidney disease with CKD (chronic kidney disease) stage V ([place]) 11/13/2018  . Hypertensive emergency 07/14/2018  . SYNCOPE 01/09/2009  . SMOKER 08/28/2008  . DEEP VENOUS THROMBOPHLEBITIS-RIGHT UPPER EXTREMITY 08/28/2008  . MICROCYTOSIS 12/29/2007  . Hyperlipidemia 05/03/2007  . DEPRESSION, MAJOR 05/03/2007  . SLEEP APNEA, OBSTRUCTIVE, MILD 01/18/2007  . OBESITY NOS 09/22/2006  . DEPRESSION 09/22/2006  . Essential hypertension 09/22/2006  . DVT, HX OF 09/22/2006   12:20 PM, 10/29/19 Josue Hector PT DPT  Physical Therapist with [facility]  [phone]  [facility] [address] [place], [place], [address] Phone: [phone]   Fax:  [phone]  Name: [name] MRN: [id number] Date of Birth: 12-29-1960

## 2019-10-31 DIAGNOSIS — G4730 Sleep apnea, unspecified: Secondary | ICD-10-CM | POA: Diagnosis not present

## 2019-11-01 ENCOUNTER — Telehealth (HOSPITAL_COMMUNITY): Payer: Self-pay | Admitting: Physical Therapy

## 2019-11-01 ENCOUNTER — Encounter (HOSPITAL_COMMUNITY): Payer: BC Managed Care – PPO | Admitting: Physical Therapy

## 2019-11-01 NOTE — Telephone Encounter (Signed)
pt called to cancel due to he is in a lot of pain.

## 2019-11-04 ENCOUNTER — Other Ambulatory Visit: Payer: Self-pay | Admitting: Family Medicine

## 2019-11-05 ENCOUNTER — Encounter: Payer: Self-pay | Admitting: Family Medicine

## 2019-11-05 ENCOUNTER — Other Ambulatory Visit: Payer: Self-pay

## 2019-11-05 ENCOUNTER — Ambulatory Visit (INDEPENDENT_AMBULATORY_CARE_PROVIDER_SITE_OTHER): Payer: BC Managed Care – PPO | Admitting: Family Medicine

## 2019-11-05 DIAGNOSIS — M549 Dorsalgia, unspecified: Secondary | ICD-10-CM | POA: Diagnosis not present

## 2019-11-05 MED ORDER — ETODOLAC 400 MG PO TABS: 400.0000 mg | ORAL_TABLET | Freq: Two times a day (BID) | ORAL | 2 refills | Status: DC

## 2019-11-05 MED ORDER — HYDROCODONE-ACETAMINOPHEN 5-325 MG PO TABS: ORAL_TABLET | ORAL | 0 refills | Status: DC

## 2019-11-05 NOTE — Progress Notes (Signed)
   Subjective:  Audio only  Patient ID: [name], male    DOB: 1961/09/01, 59 y.o.   MRN: [id number]  Back Pain This is a recurrent problem. Episode onset: Late November  The problem has been gradually worsening since onset. The quality of the pain is described as aching (sharp, dull). Radiates to: buttock area  The pain is moderate. The symptoms are aggravated by bending (any kind of movement). He has tried muscle relaxant and heat for the symptoms. The treatment provided mild relief.   Pt has began PT but states he was only able to go one day due to being in pain.   Virtual Visit via Telephone Note  I connected with [name] on 11/05/19 at 11:00 AM EST by telephone and verified that I am speaking with the correct person using two identifiers.  Location: Patient: home Provider: office   I discussed the limitations, risks, security and privacy concerns of performing an evaluation and management service by telephone and the availability of in person appointments. I also discussed with the patient that there may be a patient responsible charge related to this service. The patient expressed understanding and agreed to proceed.   History of Present Illness:    Observations/Objective:   Assessment and Plan:   Follow Up Instructions:    I discussed the assessment and treatment plan with the patient. The patient was provided an opportunity to ask questions and all were answered. The patient agreed with the plan and demonstrated an understanding of the instructions.   The patient was advised to call back or seek an in-person evaluation if the symptoms worsen or if the condition fails to improve as anticipated.  I provided 20 inutes of non-face-to-face time during this encounter.   Patient notes ongoing severe low back pain.  Primarily left lower lumbar region.  Some radiation into that thigh.  Not below the knee.  Has had to cancel some of his physical therapy due to pain.   Did not get the x-rays ordered.  This is discussed he will get one today.  Uses pain medicine sporadically.  Ongoing severe pain.  States definitely unable to work    Review of Systems  Musculoskeletal: Positive for back pain.       Objective:   Physical Exam   Virtual    Assessment & Plan:  Impression subacute low back pain left lumbar region.  Now duration greater than 6 weeks.  Was stated inability to work due to severity of pain.  Continue physical therapy.  Referral to back specialist specifically orthopedic with back expertise.  Hydrocodone refilled symptom care discussed

## 2019-11-06 ENCOUNTER — Encounter: Payer: Self-pay | Admitting: Family Medicine

## 2019-11-06 ENCOUNTER — Ambulatory Visit (HOSPITAL_COMMUNITY): Payer: BC Managed Care – PPO | Admitting: Physical Therapy

## 2019-11-06 ENCOUNTER — Telehealth (HOSPITAL_COMMUNITY): Payer: Self-pay | Admitting: Physical Therapy

## 2019-11-06 NOTE — Telephone Encounter (Signed)
pt had to cancel his appt this week because his daughter had a seizure

## 2019-11-08 ENCOUNTER — Encounter (HOSPITAL_COMMUNITY): Payer: Self-pay

## 2019-11-08 ENCOUNTER — Other Ambulatory Visit: Payer: Self-pay

## 2019-11-08 ENCOUNTER — Ambulatory Visit (HOSPITAL_COMMUNITY): Payer: BC Managed Care – PPO | Admitting: Physical Therapy

## 2019-11-08 ENCOUNTER — Ambulatory Visit (HOSPITAL_COMMUNITY)
Admission: RE | Admit: 2019-11-08 | Discharge: 2019-11-08 | Disposition: A | Discharge status: Home / Self Care | Payer: BC Managed Care – PPO | Source: Ambulatory Visit | Attending: Family Medicine | Admitting: Family Medicine

## 2019-11-08 DIAGNOSIS — M545 Low back pain, unspecified: Secondary | ICD-10-CM

## 2019-11-08 DIAGNOSIS — R2689 Other abnormalities of gait and mobility: Secondary | ICD-10-CM

## 2019-11-08 DIAGNOSIS — S39012A Strain of muscle, fascia and tendon of lower back, initial encounter: Secondary | ICD-10-CM | POA: Insufficient documentation

## 2019-11-08 DIAGNOSIS — M6281 Muscle weakness (generalized): Secondary | ICD-10-CM | POA: Diagnosis not present

## 2019-11-08 NOTE — Therapy (Signed)
[place], [place], [address] Phone: [phone]   Fax:  [phone]  Physical Therapy Treatment  Patient Details  Name: [name] MRN: [id number] Date of Birth: 09-Apr-1961 Referring Provider (PT): Genia Harold MD   Encounter Date: 11/08/2019  PT End of Session - 11/08/19 1211    Visit Number  2    Number of Visits  16    Date for PT Re-Evaluation  12/28/19   Reassess on 11/30/19   Authorization Type  BCBS (No auth req)    Authorization Time Period  10/29/19-12/28/19    PT Start Time  1140    PT Stop Time  1218    PT Time Calculation (min)  38 min    Activity Tolerance  Patient limited by pain    Behavior During Therapy  Restless       Past Medical History:  Diagnosis Date  . Depression   . H/O blood clots 2008  . Hypertension   . Migraine   . Sleep apnea     Past Surgical History:  Procedure Laterality Date  . HERNIA REPAIR    . KNEE SURGERY      There were no vitals filed for this visit.  Subjective Assessment - 11/08/19 1143    Subjective  pt reports 8/10 pain in lower back down Lt LE.  STates he's been doing his HEP but feels its making it worse.  Pt has cancelled his last 2 appts due to intense LBP and one for family issue.    Currently in Pain?  Yes    Pain Score  8     Pain Location  Back    Pain Orientation  Left;Lower;Medial    Pain Descriptors / Indicators  Aching;Radiating;Sharp;Shooting    Pain Radiating Towards  lt buttock into upper posterior thigh, bending makes it worse.                       [facility] Adult PT Treatment/Exercise - 11/08/19 0001      Lumbar Exercises: Stretches   Active Hamstring Stretch  Right;Left;2 reps;20 seconds    Active Hamstring Stretch Limitations  supine with towel    Single Knee to Chest Stretch  Right;Left;2 reps;20 seconds    Lower Trunk Rotation  5 reps    Pelvic Tilt  10 reps    Prone on Elbows Stretch  3 reps;30 seconds    Other Lumbar Stretch Exercise  prone lying 2 minutes    Other Lumbar Stretch Exercise  decompression 1-5 5 reps each             PT Education - 11/08/19 1210    Education Details  reviewed goals, HEP and POC moving forward    Person(s) Educated  Patient    Methods  Explanation;Demonstration;Tactile cues;Verbal cues    Comprehension  Verbalized understanding;Returned demonstration;Verbal cues required;Tactile cues required;Need further instruction       PT Short Term Goals - 11/08/19 1208      PT SHORT TERM GOAL #1   Title  Patient will be independent with initial HEP to improve functional outcomes    Time  4    Period  Weeks    Status  On-going    Target Date  11/30/19      PT SHORT TERM GOAL #2   Title  Patient will improve FOTO score to <65 to indicate improvement in functional outcomes    Time  4  Period  Weeks    Status  On-going    Target Date  11/30/19        PT Long Term Goals - 11/08/19 1209      PT LONG TERM GOAL #1   Title  Patient will improve FOTO score to <50% to indicate improvement in functional outcomes    Time  8    Period  Weeks    Status  On-going      PT LONG TERM GOAL #2   Title  Patient will report at least 75% overall improvement in subjective complaint to indicate improvement in ability to perform ADLs.    Time  8    Period  Weeks    Status  On-going      PT LONG TERM GOAL #3   Title  Patient will have equal to or > 4+/5 MMT throughout BLE to improve ability to perform functional mobility, stair ambulation, ADLs and work tasks    Time  8    Period  Weeks    Status  On-going      PT LONG TERM GOAL #4   Title  Patient will be able to ambulate at least 450 feet during 2MWT with LRAD to demonstrate improved ability to perform functional mobility and associated tasks.    Time  8    Period  Weeks    Status  On-going            Plan - 11/08/19 1211    Clinical Impression Statement  Reviewed HEP, goals and POC moving foward.   Pt with more pain with flexion so focused more on extension base exercises.  Pt with heavy breathing, antalgia and pain maganification with all actvities, however able to complete through full range with all on bilaterally and equal in motion form Lt to Rt.   Began with decompression to help reduce pressure and pain through spine.  pt required cues for breathing and using leg mm rather than relying on UE's to pick up legs.  Pt also sat straght up from supine and educated on logroll techique moving forward.    Examination-Activity Limitations  Bathing;Sit;Sleep;Bed Mobility;Bend;Squat;Stairs;Stand;Carry;Caring for Others;Toileting;Dressing;Transfers;Lift;Locomotion Level    Examination-Participation Restrictions  Other;Yard Work;Community Activity;Driving;Laundry    Stability/Clinical Decision Making  Stable/Uncomplicated    Rehab Potential  Fair    PT Frequency  2x / week    PT Duration  8 weeks    PT Treatment/Interventions  ADLs/Self Care Home Management;Biofeedback;Electrical Stimulation;Moist Heat;Balance training;Manual techniques;Therapeutic exercise;Therapeutic activities;Functional mobility training;Stair training;Gait training;DME Instruction;Patient/family education;Orthotic Fit/Training;Neuromuscular re-education;Passive range of motion;Spinal Manipulations;Joint Manipulations;Splinting;Taping    PT Next Visit Plan  Progress table exercise as tolerated and work on bed mobility (log roll).  Add ab set, glute squeeze, heel slides if able tnext session.  REveiw decompression exercises and stretches.    PT Home Exercise Plan  10/29/19: posterior pelvic tilts    Consulted and Agree with Plan of Care  Patient       Patient will benefit from skilled therapeutic intervention in order to improve the following deficits and impairments:  Abnormal gait, Hypomobility, Decreased activity tolerance, Decreased strength, Pain, Decreased mobility, Difficulty walking, Decreased range of motion, Improper body  mechanics, Impaired flexibility, Postural dysfunction  Visit Diagnosis: Muscle weakness (generalized)  Other abnormalities of gait and mobility  Left-sided low back pain without sciatica, unspecified chronicity     Problem List Patient Active Problem List   Diagnosis Date Noted  . Hypertensive kidney disease with CKD (chronic kidney disease) stage  V ([place]) 11/13/2018  . Hypertensive emergency 07/14/2018  . SYNCOPE 01/09/2009  . SMOKER 08/28/2008  . DEEP VENOUS THROMBOPHLEBITIS-RIGHT UPPER EXTREMITY 08/28/2008  . MICROCYTOSIS 12/29/2007  . Hyperlipidemia 05/03/2007  . DEPRESSION, MAJOR 05/03/2007  . SLEEP APNEA, OBSTRUCTIVE, MILD 01/18/2007  . OBESITY NOS 09/22/2006  . DEPRESSION 09/22/2006  . Essential hypertension 09/22/2006  . DVT, HX OF 09/22/2006   Teena Irani, PTA/CLT [phone]  Teena Irani 11/08/2019, 12:25 PM  [facility] [address] [place], [place], [address] Phone: [phone]   Fax:  [phone]  Name: [name] MRN: [id number] Date of Birth: 1960-10-31

## 2019-11-13 ENCOUNTER — Ambulatory Visit (HOSPITAL_COMMUNITY): Payer: BC Managed Care – PPO | Admitting: Physical Therapy

## 2019-11-13 ENCOUNTER — Other Ambulatory Visit: Payer: Self-pay

## 2019-11-13 ENCOUNTER — Encounter (HOSPITAL_COMMUNITY): Payer: Self-pay | Admitting: Physical Therapy

## 2019-11-13 DIAGNOSIS — M6281 Muscle weakness (generalized): Secondary | ICD-10-CM | POA: Diagnosis not present

## 2019-11-13 DIAGNOSIS — M545 Low back pain, unspecified: Secondary | ICD-10-CM

## 2019-11-13 DIAGNOSIS — R2689 Other abnormalities of gait and mobility: Secondary | ICD-10-CM

## 2019-11-13 DIAGNOSIS — M5116 Intervertebral disc disorders with radiculopathy, lumbar region: Secondary | ICD-10-CM | POA: Diagnosis not present

## 2019-11-13 DIAGNOSIS — M4310 Spondylolisthesis, site unspecified: Secondary | ICD-10-CM | POA: Diagnosis not present

## 2019-11-13 NOTE — Therapy (Signed)
[place], [place], [address] Phone: [phone]   Fax:  [phone]  Physical Therapy Treatment  Patient Details  Name: [name] MRN: [id number] Date of Birth: 11/30/1960 Referring Provider (PT): Genia Harold MD   Encounter Date: 11/13/2019  PT End of Session - 11/13/19 1125    Visit Number  3    Number of Visits  16    Date for PT Re-Evaluation  12/28/19   Reassess on 11/30/19   Authorization Type  BCBS (No auth req)    Authorization Time Period  10/29/19-12/28/19    PT Start Time  1120    PT Stop Time  1200    PT Time Calculation (min)  40 min    Activity Tolerance  Patient limited by pain    Behavior During Therapy  Restless       Past Medical History:  Diagnosis Date  . Depression   . H/O blood clots 2008  . Hypertension   . Migraine   . Sleep apnea     Past Surgical History:  Procedure Laterality Date  . HERNIA REPAIR    . KNEE SURGERY      There were no vitals filed for this visit.  Subjective Assessment - 11/13/19 1124    Subjective  Patient says his back feels the same and feels that exercises are making his pain worse. Patient says he has tried doing his exercise at home but they hurt. Patient says he had xray last week and showed disc problems, says he has consult with ortho surgeon today. Rates current pain at 7-8/10.    Currently in Pain?  Yes    Pain Score  8     Pain Location  Back    Pain Orientation  Left;Lower    Pain Descriptors / Indicators  Aching;Sharp;Shooting    Pain Onset  More than a month ago    Pain Frequency  Constant                       [facility] Adult PT Treatment/Exercise - 11/13/19 0001      Lumbar Exercises: Stretches   Lower Trunk Rotation  --    Pelvic Tilt  10 reps    Pelvic Tilt Limitations  5 sec holds    Other Lumbar Stretch Exercise  prone lying 1 minute, POE 1 minute, prone press up x10 (through available range)    Other Lumbar  Stretch Exercise  decompression 1 (5 deep breaths) 2-5 (10x 5sec)      Lumbar Exercises: Supine   Ab Set  10 reps;5 seconds               PT Short Term Goals - 11/08/19 1208      PT SHORT TERM GOAL #1   Title  Patient will be independent with initial HEP to improve functional outcomes    Time  4    Period  Weeks    Status  On-going    Target Date  11/30/19      PT SHORT TERM GOAL #2   Title  Patient will improve FOTO score to <65 to indicate improvement in functional outcomes    Time  4    Period  Weeks    Status  On-going    Target Date  11/30/19        PT Long Term Goals - 11/08/19 1209      PT LONG TERM GOAL #  1   Title  Patient will improve FOTO score to <50% to indicate improvement in functional outcomes    Time  8    Period  Weeks    Status  On-going      PT LONG TERM GOAL #2   Title  Patient will report at least 75% overall improvement in subjective complaint to indicate improvement in ability to perform ADLs.    Time  8    Period  Weeks    Status  On-going      PT LONG TERM GOAL #3   Title  Patient will have equal to or > 4+/5 MMT throughout BLE to improve ability to perform functional mobility, stair ambulation, ADLs and work tasks    Time  8    Period  Weeks    Status  On-going      PT LONG TERM GOAL #4   Title  Patient will be able to ambulate at least 450 feet during 2MWT with LRAD to demonstrate improved ability to perform functional mobility and associated tasks.    Time  8    Period  Weeks    Status  On-going            Plan - 11/13/19 1256    Clinical Impression Statement  Patient tolerated session somewhat better today. Patient required cueing for pain free ROM with supine pelvic tilts to reduce excess lumbar strain. Was cued on relaxation and breathing technique to avoid muscle guarding. Patient noted increased LT low back pain with decompression # 4 exercise on RLE. Pain decreased some with extension of LLE, but did not fully  resolve. Patient appears to have increased pain with flexion-based movements, so had patient perform prone press up progressions from prone lying, POE, prone press up through available range. Patient reported no effect, remains at 7-8/10 low back/ left hip pain. Patient educated on performing decompression exercise to tolerance at home, and with mindful breathing to avoid muscle guarding.    Examination-Activity Limitations  Bathing;Sit;Sleep;Bed Mobility;Bend;Squat;Stairs;Stand;Carry;Caring for Others;Toileting;Dressing;Transfers;Lift;Locomotion Level    Examination-Participation Restrictions  Other;Yard Work;Community Activity;Driving;Laundry    Stability/Clinical Decision Making  Stable/Uncomplicated    Rehab Potential  Fair    PT Frequency  2x / week    PT Duration  8 weeks    PT Treatment/Interventions  ADLs/Self Care Home Management;Biofeedback;Electrical Stimulation;Moist Heat;Balance training;Manual techniques;Therapeutic exercise;Therapeutic activities;Functional mobility training;Stair training;Gait training;DME Instruction;Patient/family education;Orthotic Fit/Training;Neuromuscular re-education;Passive range of motion;Spinal Manipulations;Joint Manipulations;Splinting;Taping    PT Next Visit Plan  Continue to progress table core exercise as tolerated, progress to satnding if able. Follow up with patient about ortho surgeon consult.    PT Home Exercise Plan  10/29/19: posterior pelvic tilts    Consulted and Agree with Plan of Care  Patient       Patient will benefit from skilled therapeutic intervention in order to improve the following deficits and impairments:  Abnormal gait, Hypomobility, Decreased activity tolerance, Decreased strength, Pain, Decreased mobility, Difficulty walking, Decreased range of motion, Improper body mechanics, Impaired flexibility, Postural dysfunction  Visit Diagnosis: Muscle weakness (generalized)  Other abnormalities of gait and mobility  Left-sided low  back pain without sciatica, unspecified chronicity     Problem List Patient Active Problem List   Diagnosis Date Noted  . Hypertensive kidney disease with CKD (chronic kidney disease) stage V ([place]) 11/13/2018  . Hypertensive emergency 07/14/2018  . SYNCOPE 01/09/2009  . SMOKER 08/28/2008  . DEEP VENOUS THROMBOPHLEBITIS-RIGHT UPPER EXTREMITY 08/28/2008  . MICROCYTOSIS 12/29/2007  . Hyperlipidemia  05/03/2007  . DEPRESSION, MAJOR 05/03/2007  . SLEEP APNEA, OBSTRUCTIVE, MILD 01/18/2007  . OBESITY NOS 09/22/2006  . DEPRESSION 09/22/2006  . Essential hypertension 09/22/2006  . DVT, HX OF 09/22/2006   1:02 PM, 11/13/19 Josue Hector PT DPT  Physical Therapist with [facility]  [phone] [place] [address] [place], [place], [address] Phone: [phone]   Fax:  [phone]  Name: SHAKEEM SKAGGS MRN: [id number] Date of Birth: 06/13/1961

## 2019-11-15 ENCOUNTER — Ambulatory Visit (HOSPITAL_COMMUNITY): Payer: BC Managed Care – PPO | Admitting: Physical Therapy

## 2019-11-15 ENCOUNTER — Telehealth (HOSPITAL_COMMUNITY): Payer: Self-pay | Admitting: Physical Therapy

## 2019-11-15 NOTE — Telephone Encounter (Signed)
Called patient about missed appt today. Left message. Reminded patient of next visit on 11/20/19.  11:46 AM, 11/15/19 Josue Hector PT DPT  Physical Therapist with [facility], [place]  [phone]

## 2019-11-20 ENCOUNTER — Ambulatory Visit (HOSPITAL_COMMUNITY): Payer: BC Managed Care – PPO | Attending: Family Medicine | Admitting: Physical Therapy

## 2019-11-21 ENCOUNTER — Encounter: Payer: Self-pay | Admitting: Family Medicine

## 2019-11-22 ENCOUNTER — Telehealth (HOSPITAL_COMMUNITY): Payer: Self-pay | Admitting: Physical Therapy

## 2019-11-22 ENCOUNTER — Telehealth: Payer: Self-pay | Admitting: Family Medicine

## 2019-11-22 ENCOUNTER — Other Ambulatory Visit: Payer: Self-pay | Admitting: Family Medicine

## 2019-11-22 ENCOUNTER — Ambulatory Visit (HOSPITAL_COMMUNITY): Payer: BC Managed Care – PPO | Admitting: Physical Therapy

## 2019-11-22 NOTE — Telephone Encounter (Signed)
[employer] short-term disability faxed over forms to be completed. I fill in what I could please review and complete highlighted areas,date and sign. Form is in your yellow folder.

## 2019-11-22 NOTE — Telephone Encounter (Signed)
Pt did not show for appointment and today was is 3rd consecutive NS (1/28, 2/2 and today).  Explained our NS policy and he would be discharged from therapy at this time.  Left Clinic phone number to call and discuss if questions and instructed he must return to MD for a new order if he wishes to return to therapy.  Teena Irani, PTA/CLT [phone]

## 2019-11-22 NOTE — Telephone Encounter (Signed)
Please advise. Thank you

## 2019-11-23 DIAGNOSIS — M5416 Radiculopathy, lumbar region: Secondary | ICD-10-CM | POA: Diagnosis not present

## 2019-11-23 DIAGNOSIS — Z029 Encounter for administrative examinations, unspecified: Secondary | ICD-10-CM

## 2019-11-26 ENCOUNTER — Encounter (HOSPITAL_COMMUNITY): Payer: Self-pay | Admitting: Physical Therapy

## 2019-11-26 NOTE — Therapy (Signed)
[facility], [place], [address] Phone: [phone]   Fax:  [phone]  Patient Details  Name: [name] MRN: [id number] Date of Birth: 1961/07/09 Referring Provider:  Baltazar Apo MD Encounter Date: 11/26/2019  PHYSICAL THERAPY DISCHARGE SUMMARY  Visits from Start of Care: 3  Current functional level related to goals / functional outcomes: Unable to assess as patient did not return for follow up visits   Remaining deficits: Unable to reassess   Education / Equipment: Patient did not return for follow up visits (no show appointment x 3). Patient attended a total of 3 therapy sessions, last visit dated 11/13/19. Patient being discharged at this time due to non compliance. Plan: Patient agrees to discharge.  Patient goals were not met. Patient is being discharged due to not returning since the last visit.  ?????         8:49 AM, 11/26/19 Josue Hector PT DPT  Physical Therapist with [facility]  [phone] [place] [address] [place], [place], [address] Phone: [phone]   Fax:  [phone]

## 2019-11-27 ENCOUNTER — Ambulatory Visit (HOSPITAL_COMMUNITY): Payer: BC Managed Care – PPO | Admitting: Physical Therapy

## 2019-11-29 ENCOUNTER — Ambulatory Visit: Payer: BC Managed Care – PPO | Admitting: Family Medicine

## 2019-11-29 ENCOUNTER — Ambulatory Visit (HOSPITAL_COMMUNITY): Payer: BC Managed Care – PPO | Admitting: Physical Therapy

## 2019-12-01 DIAGNOSIS — G4730 Sleep apnea, unspecified: Secondary | ICD-10-CM | POA: Diagnosis not present

## 2019-12-03 DIAGNOSIS — M5116 Intervertebral disc disorders with radiculopathy, lumbar region: Secondary | ICD-10-CM | POA: Diagnosis not present

## 2019-12-03 DIAGNOSIS — M4310 Spondylolisthesis, site unspecified: Secondary | ICD-10-CM | POA: Diagnosis not present

## 2019-12-04 ENCOUNTER — Encounter (HOSPITAL_COMMUNITY): Payer: BC Managed Care – PPO | Admitting: Physical Therapy

## 2019-12-06 ENCOUNTER — Encounter (HOSPITAL_COMMUNITY): Payer: BC Managed Care – PPO | Admitting: Physical Therapy

## 2019-12-11 ENCOUNTER — Encounter (HOSPITAL_COMMUNITY): Payer: BC Managed Care – PPO | Admitting: Physical Therapy

## 2019-12-20 DIAGNOSIS — M5416 Radiculopathy, lumbar region: Secondary | ICD-10-CM | POA: Diagnosis not present

## 2019-12-20 DIAGNOSIS — M4310 Spondylolisthesis, site unspecified: Secondary | ICD-10-CM | POA: Diagnosis not present

## 2019-12-20 DIAGNOSIS — M5136 Other intervertebral disc degeneration, lumbar region: Secondary | ICD-10-CM | POA: Diagnosis not present

## 2019-12-29 DIAGNOSIS — G4730 Sleep apnea, unspecified: Secondary | ICD-10-CM | POA: Diagnosis not present

## 2020-01-10 ENCOUNTER — Encounter: Payer: BC Managed Care – PPO | Admitting: Diagnostic Neuroimaging

## 2020-01-10 ENCOUNTER — Other Ambulatory Visit: Payer: Self-pay

## 2020-01-10 ENCOUNTER — Telehealth: Payer: Self-pay | Admitting: Family Medicine

## 2020-01-10 NOTE — Telephone Encounter (Signed)
Please contact pt to set up appt for HTN check up. Then may route back to nurses. Thank you

## 2020-01-10 NOTE — Telephone Encounter (Signed)
Pt is requesting refill request on amLODipine (NORVASC) 5 MG tablet.   [id number] [address], [address] AT [place]

## 2020-01-11 MED ORDER — AMLODIPINE BESYLATE 5 MG PO TABS: 5.0000 mg | ORAL_TABLET | Freq: Every day | ORAL | 0 refills | Status: DC

## 2020-01-11 NOTE — Telephone Encounter (Signed)
90 day rx sent to pharmacy.

## 2020-01-11 NOTE — Telephone Encounter (Signed)
Patient has 6 month follow up on 4/2

## 2020-01-18 ENCOUNTER — Other Ambulatory Visit: Payer: Self-pay

## 2020-01-18 ENCOUNTER — Ambulatory Visit (INDEPENDENT_AMBULATORY_CARE_PROVIDER_SITE_OTHER): Payer: BC Managed Care – PPO | Admitting: Family Medicine

## 2020-01-18 DIAGNOSIS — Z79899 Other long term (current) drug therapy: Secondary | ICD-10-CM

## 2020-01-18 DIAGNOSIS — R972 Elevated prostate specific antigen [PSA]: Secondary | ICD-10-CM

## 2020-01-18 DIAGNOSIS — I10 Essential (primary) hypertension: Secondary | ICD-10-CM

## 2020-01-18 DIAGNOSIS — E785 Hyperlipidemia, unspecified: Secondary | ICD-10-CM

## 2020-01-18 DIAGNOSIS — F5101 Primary insomnia: Secondary | ICD-10-CM

## 2020-01-18 DIAGNOSIS — F321 Major depressive disorder, single episode, moderate: Secondary | ICD-10-CM

## 2020-01-18 MED ORDER — FLUOXETINE HCL 20 MG PO TABS: 40.0000 mg | ORAL_TABLET | Freq: Every day | ORAL | 1 refills | Status: DC

## 2020-01-18 MED ORDER — AMLODIPINE BESYLATE 5 MG PO TABS: 5.0000 mg | ORAL_TABLET | Freq: Every day | ORAL | 1 refills | Status: DC

## 2020-01-18 MED ORDER — ALPRAZOLAM 1 MG PO TABS: ORAL_TABLET | ORAL | 5 refills | Status: DC

## 2020-01-18 MED ORDER — HYDRALAZINE HCL 50 MG PO TABS: ORAL_TABLET | ORAL | 1 refills | Status: DC

## 2020-01-18 NOTE — Progress Notes (Signed)
   Subjective:    Patient ID: [name], male    DOB: Feb 13, 1961, 59 y.o.   MRN: [id number]  Hypertension This is a chronic problem. Compliance problems include exercise and diet (takes meds every day, cannot do exercise due to back pain and does not eat healthy).     Needs refill on xanax. Takes for sleep.  Claims of medication definitely helps him for sleep   Still out of work due to back pain. Has appt with neurosurgeon on April 6th.  Patient has ongoing challenges with back pain.  Now due to see neurosurgeon shortly.  States pain is severe and unable to work.  Virtual Visit via Telephone Note  I connected with [name] on 01/18/20 at  1:10 PM EDT by telephone and verified that I am speaking with the correct person using two identifiers.  Location: Patient: home Provider: office   I discussed the limitations, risks, security and privacy concerns of performing an evaluation and management service by telephone and the availability of in person appointments. I also discussed with the patient that there may be a patient responsible charge related to this service. The patient expressed understanding and agreed to proceed.   History of Present Illness:    Observations/Objective:   Assessment and Plan:   Follow Up Instructions:    I discussed the assessment and treatment plan with the patient. The patient was provided an opportunity to ask questions and all were answered. The patient agreed with the plan and demonstrated an understanding of the instructions.   The patient was advised to call back or seek an in-person evaluation if the symptoms worsen or if the condition fails to improve as anticipated.  I provided   22 minutes of non-face-to-face time during this encounter.  Blood pressure medicine and blood pressure levels reviewed today with patient. Compliant with blood pressure medicine. States does not miss a dose. No obvious side effects. Blood pressure  generally good when checked elsewhere. Watching salt intake.      Review of Systems No headache, no major weight loss or weight gain, no chest pain no back pain abdominal pain no change in bowel habits complete ROS otherwise negative     Objective:   Physical Exam Virtual       Assessment & Plan:  Impression 1 hypertension.  Para good control discussed maintain same meds  2.  Depression/anxiety/insomnia.  Patient claims these are stable at this time.  We will refill both medications  3.  Elevated PSA.  This was discussed last fall.  He was supposed to follow-up with his urologist but did not.  Patient has had work-ups in the past with negative biopsies thus far.  We will try to assist him in his urology reconnection  4.  Back pain.  Severe.  Ongoing.  Due to see neurosurgeon shortly.  I have advised his back is far too complex for [place] to manage his specialist will need to treat manage fill out FMLA's etc.  Follow-up every 6 months

## 2020-01-22 DIAGNOSIS — M545 Low back pain: Secondary | ICD-10-CM | POA: Diagnosis not present

## 2020-01-22 DIAGNOSIS — M4316 Spondylolisthesis, lumbar region: Secondary | ICD-10-CM | POA: Diagnosis not present

## 2020-01-23 ENCOUNTER — Other Ambulatory Visit: Payer: Self-pay | Admitting: Neurological Surgery

## 2020-01-23 DIAGNOSIS — M545 Low back pain, unspecified: Secondary | ICD-10-CM

## 2020-01-28 ENCOUNTER — Telehealth: Payer: Self-pay | Admitting: Nurse Practitioner

## 2020-01-28 ENCOUNTER — Encounter: Payer: Self-pay | Admitting: Family Medicine

## 2020-01-28 NOTE — Telephone Encounter (Signed)
Phone call to patient to verify medication list and allergies for myelogram procedure. Pt aware he will not need to hold any medications for this procedure. Pt verbalized understanding. Pre and post procedure instructions reviewed with pt.

## 2020-01-29 DIAGNOSIS — G4730 Sleep apnea, unspecified: Secondary | ICD-10-CM | POA: Diagnosis not present

## 2020-02-21 ENCOUNTER — Ambulatory Visit
Admission: RE | Admit: 2020-02-21 | Discharge: 2020-02-21 | Disposition: A | Discharge status: Home / Self Care | Payer: BC Managed Care – PPO | Source: Ambulatory Visit | Attending: Neurological Surgery | Admitting: Neurological Surgery

## 2020-02-21 ENCOUNTER — Other Ambulatory Visit: Payer: Self-pay

## 2020-02-21 ENCOUNTER — Other Ambulatory Visit: Payer: Self-pay | Admitting: Diagnostic Radiology

## 2020-02-21 DIAGNOSIS — M5126 Other intervertebral disc displacement, lumbar region: Secondary | ICD-10-CM | POA: Diagnosis not present

## 2020-02-21 DIAGNOSIS — M48061 Spinal stenosis, lumbar region without neurogenic claudication: Secondary | ICD-10-CM | POA: Diagnosis not present

## 2020-02-21 DIAGNOSIS — M545 Low back pain, unspecified: Secondary | ICD-10-CM

## 2020-02-21 DIAGNOSIS — M4316 Spondylolisthesis, lumbar region: Secondary | ICD-10-CM | POA: Diagnosis not present

## 2020-02-21 MED ORDER — DIAZEPAM 5 MG PO TABS
10.0000 mg | ORAL_TABLET | Freq: Once | ORAL | Status: AC
  Administered 2020-02-21: 10 mg via ORAL

## 2020-02-21 MED ORDER — MEPERIDINE HCL 100 MG/ML IJ SOLN
25.0000 mg | Freq: Once | INTRAMUSCULAR | Status: AC
  Administered 2020-02-21: 25 mg via INTRAMUSCULAR

## 2020-02-21 MED ORDER — ONDANSETRON HCL 4 MG/2ML IJ SOLN
4.0000 mg | Freq: Once | INTRAMUSCULAR | Status: AC
  Administered 2020-02-21: 4 mg via INTRAMUSCULAR

## 2020-02-21 MED ORDER — IOPAMIDOL (ISOVUE-M 200) INJECTION 41%
20.0000 mL | Freq: Once | INTRAMUSCULAR | Status: AC
  Administered 2020-02-21: 20 mL via INTRATHECAL

## 2020-02-21 MED ORDER — MEPERIDINE HCL 100 MG/ML IJ SOLN
75.0000 mg | Freq: Once | INTRAMUSCULAR | Status: AC
  Administered 2020-02-21: 75 mg via INTRAMUSCULAR

## 2020-02-21 NOTE — Discharge Instructions (Signed)

## 2020-02-28 DIAGNOSIS — M4316 Spondylolisthesis, lumbar region: Secondary | ICD-10-CM | POA: Diagnosis not present

## 2020-02-28 DIAGNOSIS — G4730 Sleep apnea, unspecified: Secondary | ICD-10-CM | POA: Diagnosis not present

## 2020-03-11 ENCOUNTER — Ambulatory Visit: Payer: BC Managed Care – PPO | Admitting: Urology

## 2020-03-11 NOTE — Progress Notes (Incomplete)
H&P  Chief Complaint: Elevated PSA  History of Present Illness:   5.25.2021:  (below copied from [facility] records):  [name] is a 59 year-old male established patient who is here for an elevated PSA.  His PSA is 8.0.   He has had a prostate biopsy done. His first prostate biopsy was done 11/25/2015. He does have the pathology report from his biopsy.   He underwent TRUS/Bx on 2.7.2017 for a PSA of 8.0. Prostatic volume 6ml. PSAD 0.11.   8.14.2018--PSA 11.3.   10.29.2019: His most recent PSA is 13.6. He has not been seen in over a year. No real urinary issues. No gross hematuria. He has had marital difficulties.    Past Medical History:  Diagnosis Date  . Depression   . H/O blood clots 2008  . Hypertension   . Migraine   . Sleep apnea     Past Surgical History:  Procedure Laterality Date  . HERNIA REPAIR    . KNEE SURGERY      Home Medications:  Allergies as of 03/11/2020      Reactions   Toprol Xl [metoprolol Tartrate] Other (See Comments)   sluggish   Zoloft [sertraline Hcl] Rash      Medication List       Accurate as of Mar 11, 2020 12:56 PM. If you have any questions, ask your nurse or doctor.        ALPRAZolam 1 MG tablet Commonly known as: XANAX Take one half to one qhs prn sleep   amLODipine 5 MG tablet Commonly known as: NORVASC Take 1 tablet (5 mg total) by mouth daily.   aspirin 81 MG EC tablet Take 1 tablet (81 mg total) by mouth daily.   etodolac 400 MG tablet Commonly known as: Lodine Take 1 tablet (400 mg total) by mouth 2 (two) times daily.   hydrALAZINE 50 MG tablet Commonly known as: APRESOLINE TAKE 1 AND 1/2 TABLETS BY MOUTH THREE TIMES DAILY   UNKNOWN TO PATIENT Inject as directed every 3 (three) months. INJECTIONS for MIGRAINES administered by Orie Rout, MD at [place], [place]       Allergies:  Allergies  Allergen Reactions  . Toprol Xl [Metoprolol Tartrate] Other (See Comments)   sluggish  . Zoloft [Sertraline Hcl] Rash    Family History  Problem Relation Age of Onset  . Stroke Mother   . Heart disease Father   . Diabetes Father     Social History:  reports that he has quit smoking. He has never used smokeless tobacco. He reports current alcohol use. He reports current drug use. Drugs: Cocaine and Marijuana.  ROS: A complete review of systems was performed.  All systems are negative except for pertinent findings as noted.  Physical Exam:  Vital signs in last 24 hours: There were no vitals taken for this visit. Constitutional:  Alert and oriented, No acute distress Cardiovascular: Regular rate  Respiratory: Normal respiratory effort GI: Abdomen is soft, nontender, nondistended, no abdominal masses. No CVAT.  Genitourinary: Normal male phallus, testes are descended bilaterally and non-tender and without masses, scrotum is normal in appearance without lesions or masses, perineum is normal on inspection. Lymphatic: No lymphadenopathy Neurologic: Grossly intact, no focal deficits Psychiatric: Normal mood and affect  Laboratory Data:  No results for input(s): WBC, HGB, HCT, PLT in the last 72 hours.  No results for input(s): NA, K, CL, GLUCOSE, BUN, CALCIUM, CREATININE in the last 72 hours.  Invalid input(s): CO3   No  results found for this or any previous visit (from the past 24 hour(s)). No results found for this or any previous visit (from the past 240 hour(s)).  Renal Function: No results for input(s): CREATININE in the last 168 hours. CrCl cannot be calculated (Patient's most recent lab result is older than the maximum 21 days allowed.).  Radiologic Imaging: No results found.  Impression/Assessment:  ***  Plan:  ***

## 2020-03-25 ENCOUNTER — Other Ambulatory Visit: Payer: Self-pay | Admitting: Neurological Surgery

## 2020-03-27 ENCOUNTER — Other Ambulatory Visit: Payer: Self-pay

## 2020-03-30 DIAGNOSIS — G4730 Sleep apnea, unspecified: Secondary | ICD-10-CM | POA: Diagnosis not present

## 2020-04-02 NOTE — Progress Notes (Signed)
Information abstracted for upcoming appt with Dr Early for [name] with anterior approach.   [name] [name]

## 2020-04-11 ENCOUNTER — Other Ambulatory Visit: Payer: Self-pay | Admitting: Family Medicine

## 2020-04-22 ENCOUNTER — Other Ambulatory Visit: Payer: Self-pay

## 2020-04-22 ENCOUNTER — Encounter: Payer: Self-pay | Admitting: Vascular Surgery

## 2020-04-22 ENCOUNTER — Ambulatory Visit (INDEPENDENT_AMBULATORY_CARE_PROVIDER_SITE_OTHER): Payer: Self-pay | Admitting: Vascular Surgery

## 2020-04-22 VITALS — BP 192/117 | HR 93 | Temp 98.0°F | Resp 20 | Ht 72.0 in | Wt 227.0 lb

## 2020-04-22 DIAGNOSIS — M5137 Other intervertebral disc degeneration, lumbosacral region: Secondary | ICD-10-CM

## 2020-04-22 NOTE — Progress Notes (Signed)
[facility] [place]  Patient name: [name] MRN: [id number] DOB: 02/29/1961 Sex: male  REASON FOR CONSULT: Discuss my role in access for spine exposure for lumbar fusion  HPI: [name] is a 59 y.o. male, who is here today for discussion of anterior approach for spine fusion with Dr. Sherley Bounds.  He is quite miserable.  He is having severe back and leg pain.  He really cannot get comfortable.  He does get some relief when lying flat.  He reports that his back brace actually makes the discomfort worse.  He clearly has failed conservative treatment.  Dr. Ronnald Ramp has recommended L4-5 and L5-S1 fusion from anterior approach.  Past Medical History:  Diagnosis Date  . Back pain   . Depression   . H/O blood clots 2008  . Hypertension   . Migraine   . Obesity   . Radiculopathy   . Sleep apnea   . Spondylolisthesis     Family History  Problem Relation Age of Onset  . Stroke Mother   . Heart disease Father   . Diabetes Father     SOCIAL HISTORY: Social History   Socioeconomic History  . Marital status: Legally Separated    Spouse name: Not on file  . Number of children: Not on file  . Years of education: Not on file  . Highest education level: Not on file  Occupational History  . Not on file  Tobacco Use  . Smoking status: Former Research scientist (life sciences)  . Smokeless tobacco: Never Used  Vaping Use  . Vaping Use: Never used  Substance and Sexual Activity  . Alcohol use: Yes    Comment: "occasionally"  . Drug use: Not Currently    Types: Cocaine, Marijuana  . Sexual activity: Not on file  Other Topics Concern  . Not on file  Social History Narrative  . Not on file   Social Determinants of Health   Financial Resource Strain:   . Difficulty of Paying Living Expenses:   Food Insecurity:   . Worried About Charity fundraiser in the Last Year:   . Arboriculturist in the Last Year:   Transportation Needs:   . Lexicographer (Medical):   Marland Kitchen Lack of Transportation (Non-Medical):   Physical Activity:   . Days of Exercise per Week:   . Minutes of Exercise per Session:   Stress:   . Feeling of Stress :   Social Connections:   . Frequency of Communication with Friends and Family:   . Frequency of Social Gatherings with Friends and Family:   . Attends Religious Services:   . Active Member of Clubs or Organizations:   . Attends Archivist Meetings:   Marland Kitchen Marital Status:   Intimate Partner Violence:   . Fear of Current or Ex-Partner:   . Emotionally Abused:   Marland Kitchen Physically Abused:   . Sexually Abused:     Allergies  Allergen Reactions  . Hydrocodone     nausea  . Toprol Xl [Metoprolol Tartrate] Other (See Comments)    sluggish  . Zoloft [Sertraline Hcl] Rash    Current Outpatient Medications  Medication Sig Dispense Refill  . ALPRAZolam (XANAX) 1 MG tablet Take one half to one qhs prn sleep 30 tablet 5  . amLODipine (NORVASC) 5 MG tablet Take 1 tablet (5 mg total) by mouth daily. Needs appt/labs for more refills. 30 tablet 0  . aspirin EC 81 MG EC  tablet Take 1 tablet (81 mg total) by mouth daily. 30 tablet 0  . FLUoxetine (PROZAC) 20 MG tablet Take 20 mg by mouth daily.    . hydrALAZINE (APRESOLINE) 50 MG tablet TAKE 1 AND 1/2 TABLETS BY MOUTH THREE TIMES DAILY 405 tablet 1  . UNKNOWN TO PATIENT Inject as directed every 3 (three) months. INJECTIONS for MIGRAINES administered by Orie Rout, MD at [place], [place]    . etodolac (LODINE) 400 MG tablet Take 1 tablet (400 mg total) by mouth 2 (two) times daily. (Patient not taking: Reported on 04/22/2020) 60 tablet 2  . oxyCODONE-acetaminophen (PERCOCET/ROXICET) 5-325 MG tablet Take by mouth every 6 (six) hours as needed for severe pain. (Patient not taking: Reported on 04/22/2020)     No current facility-administered medications for this visit.    REVIEW OF SYSTEMS:  [X]  denotes positive finding, [ ]   denotes negative finding Cardiac  Comments:  Chest pain or chest pressure:    Shortness of breath upon exertion:    Short of breath when lying flat:    Irregular heart rhythm:        Vascular    Pain in calf, thigh, or hip brought on by ambulation:    Pain in feet at night that wakes you up from your sleep:     Blood clot in your veins:    Leg swelling:         Pulmonary    Oxygen at home:    Productive cough:     Wheezing:         Neurologic    Sudden weakness in arms or legs:     Sudden numbness in arms or legs:     Sudden onset of difficulty speaking or slurred speech:    Temporary loss of vision in one eye:     Problems with dizziness:         Gastrointestinal    Blood in stool:     Vomited blood:         Genitourinary    Burning when urinating:     Blood in urine:        Psychiatric    Major depression:         Hematologic    Bleeding problems:    Problems with blood clotting too easily:        Skin    Rashes or ulcers:        Constitutional    Fever or chills:      PHYSICAL EXAM: Vitals:   04/22/20 0956  BP: (!) 192/117  Pulse: 93  Resp: 20  Temp: 98 F (36.7 C)  SpO2: 95%  Weight: 227 lb (103 kg)  Height: 6' (1.829 m)    GENERAL: The patient is a well-nourished male, in no acute distress. The vital signs are documented above. CARDIOVASCULAR: Carotid arteries without bruits bilaterally.  2+ radial pulses bilaterally.  2+ femoral pulses bilaterally.  2+ dorsalis pedis pulses bilaterally. PULMONARY: There is good air exchange  ABDOMEN: Soft and non-tender  MUSCULOSKELETAL: There are no major deformities or cyanosis. NEUROLOGIC: No focal weakness or paresthesias are detected. SKIN: There are no ulcers or rashes noted. PSYCHIATRIC: The patient has a normal affect.  DATA:  I have a CT lumbar film from 02/21/2020 for review.  This shows normal location of aortic bifurcation and mild aortic calcification  MEDICAL ISSUES: Had long discussion with  the patient regarding my role in exposure.  Did explain that Dr.  Ronnald Ramp has recommended surgery for relief of his pain and anterior approach for this.  I explained the technical aspects of the procedure including the left paramedian incision and mobilization of the rectus muscle, retroperitoneal approach with mobilization of the left ureter and mobilization of the arterial venous structures overlying the spine.  Discussed potential injury for all of these  I feel that he is at acceptable risk for surgery.  He is not obese.  He has had no prior intra-abdominal surgery.  He has had a prior right inguinal hernia repair.  He has mild calcification of his aortoiliac segments.  He understands our proposed plan and will proceed with surgery as scheduled.   Rosetta Posner, MD FACS [facility] Tel [phone] Pager [phone]

## 2020-04-23 NOTE — Pre-Procedure Instructions (Signed)
Your procedure is scheduled on Monday, June 12th.  Report to [facility] Main Entrance "A" at 5:30 A.M., and check in at the Admitting office.  Call this number if you have problems the morning of surgery:  [phone]  Call [phone] if you have any questions prior to your surgery date Monday-Friday 8am-4pm    Remember:  Do not eat after midnight the night before your surgery    Take these medicines the morning of surgery with A SIP OF WATER  amLODipine (NORVASC)  FLUoxetine (PROZAC) hydrALAZINE (APRESOLINE)  Follow your surgeon's instructions on when to stop Aspirin.  If no instructions were given by your surgeon then you will need to call the office to get those instructions.    As of today, STOP taking any Aspirin (unless otherwise instructed by your surgeon) Aleve, Naproxen, Ibuprofen, Motrin, Advil, Goody's, BC's, all herbal medications, fish oil, and all vitamins.                      Do not wear jewelry.            Do not wear lotions, powders, colognes, or deodorant.            Men may shave face and neck.            Do not bring valuables to the hospital.            [facility] is not responsible for any belongings or valuables.  Do NOT Smoke (Tobacco/Vaping) or drink Alcohol 24 hours prior to your procedure If you use a CPAP at night, you may bring all equipment for your overnight stay.   Contacts, glasses, dentures or bridgework may not be worn into surgery.      For patients admitted to the hospital, discharge time will be determined by your treatment team.   Patients discharged the day of surgery will not be allowed to drive home, and someone needs to stay with them for 24 hours.    Special instructions:   - Preparing For Surgery  Before surgery, you can play an important role. Because skin is not sterile, your skin needs to be as free of germs as possible. You can reduce the number of germs on your skin by washing with CHG (chlorahexidine  gluconate) Soap before surgery.  CHG is an antiseptic cleaner which kills germs and bonds with the skin to continue killing germs even after washing.    Oral Hygiene is also important to reduce your risk of infection.  Remember - BRUSH YOUR TEETH THE MORNING OF SURGERY WITH YOUR REGULAR TOOTHPASTE  Please do not use if you have an allergy to CHG or antibacterial soaps. If your skin becomes reddened/irritated stop using the CHG.  Do not shave (including legs and underarms) for at least 48 hours prior to first CHG shower. It is OK to shave your face.  Please follow these instructions carefully.   1. Shower the NIGHT BEFORE SURGERY and the MORNING OF SURGERY with CHG Soap.   2. If you chose to wash your hair, wash your hair first as usual with your normal shampoo.  3. After you shampoo, rinse your hair and body thoroughly to remove the shampoo.  4. Use CHG as you would any other liquid soap. You can apply CHG directly to the skin and wash gently with a scrungie or a clean washcloth.   5. Apply the CHG Soap to your body ONLY FROM THE NECK DOWN.  Do not use on open wounds or open sores. Avoid contact with your eyes, ears, mouth and genitals (private parts). Wash Face and genitals (private parts)  with your normal soap.   6. Wash thoroughly, paying special attention to the area where your surgery will be performed.  7. Thoroughly rinse your body with warm water from the neck down.  8. DO NOT shower/wash with your normal soap after using and rinsing off the CHG Soap.  9. Pat yourself dry with a CLEAN TOWEL.  10. Wear CLEAN PAJAMAS to bed the night before surgery  11. Place CLEAN SHEETS on your bed the night of your first shower and DO NOT SLEEP WITH PETS.   Day of Surgery: Wear Clean/Comfortable clothing the morning of surgery Do not apply any deodorants/lotions.   Remember to brush your teeth WITH YOUR REGULAR TOOTHPASTE.   Please read over the following fact sheets that you were  given.

## 2020-04-24 ENCOUNTER — Encounter (HOSPITAL_COMMUNITY)
Admission: RE | Admit: 2020-04-24 | Discharge: 2020-04-24 | Disposition: A | Discharge status: Home / Self Care | Payer: Self-pay | Source: Ambulatory Visit | Attending: Neurological Surgery | Admitting: Neurological Surgery

## 2020-04-24 ENCOUNTER — Encounter (HOSPITAL_COMMUNITY): Payer: Self-pay

## 2020-04-24 ENCOUNTER — Ambulatory Visit (HOSPITAL_COMMUNITY)
Admission: RE | Admit: 2020-04-24 | Discharge: 2020-04-24 | Disposition: A | Discharge status: Home / Self Care | Payer: Self-pay | Source: Ambulatory Visit | Attending: Neurological Surgery | Admitting: Neurological Surgery

## 2020-04-24 ENCOUNTER — Other Ambulatory Visit (HOSPITAL_COMMUNITY)
Admission: RE | Admit: 2020-04-24 | Discharge: 2020-04-24 | Disposition: A | Discharge status: Home / Self Care | Payer: Self-pay | Source: Ambulatory Visit | Attending: Neurological Surgery | Admitting: Neurological Surgery

## 2020-04-24 ENCOUNTER — Other Ambulatory Visit: Payer: Self-pay

## 2020-04-24 DIAGNOSIS — Z01818 Encounter for other preprocedural examination: Secondary | ICD-10-CM | POA: Insufficient documentation

## 2020-04-24 DIAGNOSIS — Z86718 Personal history of other venous thrombosis and embolism: Secondary | ICD-10-CM | POA: Insufficient documentation

## 2020-04-24 DIAGNOSIS — Z9119 Patient's noncompliance with other medical treatment and regimen: Secondary | ICD-10-CM | POA: Insufficient documentation

## 2020-04-24 DIAGNOSIS — M4310 Spondylolisthesis, site unspecified: Secondary | ICD-10-CM | POA: Insufficient documentation

## 2020-04-24 DIAGNOSIS — G4733 Obstructive sleep apnea (adult) (pediatric): Secondary | ICD-10-CM | POA: Insufficient documentation

## 2020-04-24 DIAGNOSIS — Z20822 Contact with and (suspected) exposure to covid-19: Secondary | ICD-10-CM | POA: Insufficient documentation

## 2020-04-24 DIAGNOSIS — I10 Essential (primary) hypertension: Secondary | ICD-10-CM | POA: Insufficient documentation

## 2020-04-24 HISTORY — DX: Other complications of anesthesia, initial encounter: T88.59XA

## 2020-04-24 HISTORY — DX: Other specified postprocedural states: Z98.890

## 2020-04-24 HISTORY — DX: Other specified postprocedural states: R11.2

## 2020-04-24 LAB — SURGICAL PCR SCREEN
MRSA, PCR: POSITIVE — AB
Staphylococcus aureus: POSITIVE — AB

## 2020-04-24 LAB — CBC WITH DIFFERENTIAL/PLATELET
Abs Immature Granulocytes: 0.06 10*3/uL (ref 0.00–0.07)
Basophils Absolute: 0 10*3/uL (ref 0.0–0.1)
Basophils Relative: 1 %
Eosinophils Absolute: 0.1 10*3/uL (ref 0.0–0.5)
Eosinophils Relative: 1 %
HCT: 47.9 % (ref 39.0–52.0)
Hemoglobin: 14.7 g/dL (ref 13.0–17.0)
Immature Granulocytes: 1 %
Lymphocytes Relative: 16 %
Lymphs Abs: 1.2 10*3/uL (ref 0.7–4.0)
MCH: 22.3 pg — ABNORMAL LOW (ref 26.0–34.0)
MCHC: 30.7 g/dL (ref 30.0–36.0)
MCV: 72.8 fL — ABNORMAL LOW (ref 80.0–100.0)
Monocytes Absolute: 0.6 10*3/uL (ref 0.1–1.0)
Monocytes Relative: 8 %
Neutro Abs: 5.8 10*3/uL (ref 1.7–7.7)
Neutrophils Relative %: 73 %
Platelets: 240 10*3/uL (ref 150–400)
RBC: 6.58 MIL/uL — ABNORMAL HIGH (ref 4.22–5.81)
RDW: 16.8 % — ABNORMAL HIGH (ref 11.5–15.5)
WBC: 7.8 10*3/uL (ref 4.0–10.5)
nRBC: 0 % (ref 0.0–0.2)

## 2020-04-24 LAB — BASIC METABOLIC PANEL WITH GFR
Anion gap: 8 (ref 5–15)
BUN: 30 mg/dL — ABNORMAL HIGH (ref 6–20)
CO2: 25 mmol/L (ref 22–32)
Calcium: 9.4 mg/dL (ref 8.9–10.3)
Chloride: 104 mmol/L (ref 98–111)
Creatinine, Ser: 1.88 mg/dL — ABNORMAL HIGH (ref 0.61–1.24)
GFR calc Af Amer: 44 mL/min — ABNORMAL LOW
GFR calc non Af Amer: 38 mL/min — ABNORMAL LOW
Glucose, Bld: 102 mg/dL — ABNORMAL HIGH (ref 70–99)
Potassium: 5 mmol/L (ref 3.5–5.1)
Sodium: 137 mmol/L (ref 135–145)

## 2020-04-24 LAB — PROTIME-INR
INR: 1.1 (ref 0.8–1.2)
Prothrombin Time: 13.6 seconds (ref 11.4–15.2)

## 2020-04-24 LAB — TYPE AND SCREEN
ABO/RH(D): A POS
Antibody Screen: NEGATIVE

## 2020-04-24 LAB — SARS CORONAVIRUS 2 (TAT 6-24 HRS): SARS Coronavirus 2: NEGATIVE

## 2020-04-24 NOTE — Progress Notes (Signed)
PCP -Dr. Baltazar Apo  Cardiologist - denies  PPM/ICD - N/A Device Orders -N/A  Rep Notified - N/A  Chest x-ray - 04/24/20 EKG - 04/24/20 Stress Test -denies  ECHO - 07/17/18 Cardiac Cath - denies  Sleep Study - OSA+ CPAP - pt denies use; pt states that he does have a CPAP at home.   Blood Thinner Instructions:N/A Aspirin Instructions: pt states that he hasn't taken ASA in a few months   ERAS Protcol -N/A PRE-SURGERY Ensure or G2- N/A  COVID TEST- scheduled for today after PAT appointment   Anesthesia review: Yes, EKG review and blood pressure. Pt in a lot of pain at appointment. Pain has affected sleep and blood pressure. Pt takes his BP meds as prescribed daily. Pt aware of concerns with high blood pressure and surgery and is aware of the possibility of cancellation if BP does not decrease. Pt verbalizes understanding.   Patient denies shortness of breath, fever, cough and chest pain at PAT appointment   All instructions explained to the patient, with a verbal understanding of the material. Patient agrees to go over the instructions while at home for a better understanding. Patient also instructed to self quarantine after being tested for COVID-19. The opportunity to ask questions was provided.    Coronavirus Screening  Have you experienced the following symptoms:  Cough yes/no: No Fever (>100.69F)  yes/no: No Runny nose yes/no: No Sore throat yes/no: No Difficulty breathing/shortness of breath  yes/no: No  Have you or a family member traveled in the last 14 days and where? yes/no: No   If the patient indicates "YES" to the above questions, their PAT will be rescheduled to limit the exposure to others and, the surgeon will be notified. THE PATIENT WILL NEED TO BE ASYMPTOMATIC FOR 14 DAYS.   If the patient is not experiencing any of these symptoms, the PAT nurse will instruct them to NOT bring anyone with them to their appointment since they may have these symptoms or  traveled as well.   Please remind your patients and families that hospital visitation restrictions are in effect and the importance of the restrictions.

## 2020-04-24 NOTE — Progress Notes (Signed)
Left VM with Lorriane Shire, surgical scheduler for Dr. Ronnald Ramp regarding MRSA/Staph PCR results.   Jacqlyn Larsen, RN

## 2020-04-25 MED ORDER — VANCOMYCIN HCL 1500 MG/300ML IV SOLN
1500.0000 mg | INTRAVENOUS | Status: AC
  Administered 2020-04-28: 1500 mg via INTRAVENOUS
  Filled 2020-04-25 (×3): qty 300

## 2020-04-25 NOTE — Progress Notes (Signed)
Anesthesia Chart Review:  Patient was admitted to [facility] November 2019 for hypertensive urgency. He was transferred from [facility] in [place] at family's request. He did report chest pain and cardiology was consulted.  Troponin cycled and negative. EKG without acute findings. As per H&P, CTA chest and right lower extremity venous Dopplers 9/25 at OSH: Negative for PE or DVT. TTE at OSH 9/25: LVEF 55-60%.  TTE done here on 9/30: LVEF 50-55% and grade 1 diastolic dysfunction.Cardiology suspected musculoskeletal chest pain, recommend controlling blood pressure, avoid cocaine (cocaine and THC evidently found on UDS at OSH) which patient denies and no further cardiac testing recommended.  Chest pain resolved. Cardiology recommended he could follow-up as needed.   OSA, noncompliant with CPAP.   Hx of DVT of RUE in 2009.  Chronic medical conditions followed by PCP (previously Dr. Baltazar Apo, now different MD same practice). Last seen 01/18/20 via televisit, referred to neurosurgery for severe LBP.   BP elevated at PAT elevated. 169/113 on arrival, 163/100 on recheck. Pressure was 192/117 when seen by Dr. Donnetta Hutching 04/22/20.  Preop labs reviewed, creatinine elevated at 1.88 which, per review of records, appears to be a bit above his baseline of ~1.60.  Pt's PCP was Dr. Baltazar Apo, who recently retired. His care has been transitioned to Dr. Lovena Le in the same practice but she is currently out of the office until next week.   Discussed case with Dr. Ermalene Postin. He advised given uncontrolled HTN pt at risk for DOS cancellation. He also discussed the case with Dr. Ronnald Ramp to relay that concern. Dr. Ronnald Ramp advised he did not think case should be postponed and would like to proceed as planned. He does understand the risk for DOS cancellation given uncontrolled HTN.   EKG 04/24/2020: NSR.  Rate 79.  Moderate voltage criteria for LVH.  Septal infarct,age undetermined, lateral T wave inversions.  Does not appear to  be significantly change from tracing 07/18/2018.  TTE 07/17/18: Study Conclusions   - Left ventricle: The cavity size was normal. Wall thickness was  increased in a pattern of mild LVH. Systolic function was normal.  The estimated ejection fraction was in the range of 50% to 55%.  Doppler parameters are consistent with abnormal left ventricular  relaxation (grade 1 diastolic dysfunction).    Wynonia Musty [facility] Short Stay Center/Anesthesiology Phone [phone] 04/25/2020 4:37 PM

## 2020-04-25 NOTE — Anesthesia Preprocedure Evaluation (Addendum)
Anesthesia Evaluation  Patient identified by MRN, date of birth, ID band Patient awake    Reviewed: Allergy & Precautions, NPO status , Patient's Chart, lab work & pertinent test results  History of Anesthesia Complications (+) PONV  Airway Mallampati: I  TM Distance: >3 FB Neck ROM: Full    Dental   Pulmonary sleep apnea , Current Smoker,    Pulmonary exam normal        Cardiovascular hypertension, Pt. on medications Normal cardiovascular exam     Neuro/Psych Depression    GI/Hepatic   Endo/Other    Renal/GU Renal InsufficiencyRenal disease     Musculoskeletal   Abdominal   Peds  Hematology   Anesthesia Other Findings   Reproductive/Obstetrics                            Anesthesia Physical Anesthesia Plan  ASA: III  Anesthesia Plan: General   Post-op Pain Management:    Induction: Intravenous  PONV Risk Score and Plan: 2  Airway Management Planned: Oral ETT  Additional Equipment:   Intra-op Plan:   Post-operative Plan: Extubation in OR  Informed Consent: I have reviewed the patients History and Physical, chart, labs and discussed the procedure including the risks, benefits and alternatives for the proposed anesthesia with the patient or authorized representative who has indicated his/her understanding and acceptance.       Plan Discussed with: CRNA and Surgeon  Anesthesia Plan Comments: (See PAT note by Karoline Caldwell, PA-C )       Anesthesia Quick Evaluation

## 2020-04-28 ENCOUNTER — Other Ambulatory Visit: Payer: Self-pay

## 2020-04-28 ENCOUNTER — Inpatient Hospital Stay (HOSPITAL_COMMUNITY): Payer: Self-pay

## 2020-04-28 ENCOUNTER — Inpatient Hospital Stay (HOSPITAL_COMMUNITY)
Admission: RE | Admit: 2020-04-28 | Discharge: 2020-04-30 | DRG: 455 | Disposition: A | Discharge status: Home Health | Payer: Self-pay | Attending: Neurological Surgery | Admitting: Neurological Surgery

## 2020-04-28 ENCOUNTER — Encounter (HOSPITAL_COMMUNITY): Payer: Self-pay | Admitting: Neurological Surgery

## 2020-04-28 ENCOUNTER — Encounter (HOSPITAL_COMMUNITY)
Admission: RE | Disposition: A | Discharge status: Home / Self Care | Payer: Self-pay | Source: Home / Self Care | Attending: Neurological Surgery

## 2020-04-28 ENCOUNTER — Inpatient Hospital Stay (HOSPITAL_COMMUNITY): Payer: Self-pay | Admitting: Certified Registered"

## 2020-04-28 ENCOUNTER — Inpatient Hospital Stay (HOSPITAL_COMMUNITY): Payer: Self-pay | Admitting: Physician Assistant

## 2020-04-28 DIAGNOSIS — Z189 Retained foreign body fragments, unspecified material: Secondary | ICD-10-CM | POA: Diagnosis not present

## 2020-04-28 DIAGNOSIS — Z79899 Other long term (current) drug therapy: Secondary | ICD-10-CM

## 2020-04-28 DIAGNOSIS — Z981 Arthrodesis status: Secondary | ICD-10-CM

## 2020-04-28 DIAGNOSIS — Z885 Allergy status to narcotic agent status: Secondary | ICD-10-CM

## 2020-04-28 DIAGNOSIS — Z7982 Long term (current) use of aspirin: Secondary | ICD-10-CM

## 2020-04-28 DIAGNOSIS — I10 Essential (primary) hypertension: Secondary | ICD-10-CM | POA: Diagnosis present

## 2020-04-28 DIAGNOSIS — F329 Major depressive disorder, single episode, unspecified: Secondary | ICD-10-CM | POA: Diagnosis present

## 2020-04-28 DIAGNOSIS — M4316 Spondylolisthesis, lumbar region: Secondary | ICD-10-CM

## 2020-04-28 DIAGNOSIS — G4730 Sleep apnea, unspecified: Secondary | ICD-10-CM | POA: Diagnosis present

## 2020-04-28 DIAGNOSIS — M4317 Spondylolisthesis, lumbosacral region: Secondary | ICD-10-CM

## 2020-04-28 DIAGNOSIS — E669 Obesity, unspecified: Secondary | ICD-10-CM | POA: Diagnosis present

## 2020-04-28 DIAGNOSIS — M4326 Fusion of spine, lumbar region: Secondary | ICD-10-CM | POA: Diagnosis not present

## 2020-04-28 DIAGNOSIS — M5136 Other intervertebral disc degeneration, lumbar region: Secondary | ICD-10-CM | POA: Diagnosis present

## 2020-04-28 DIAGNOSIS — F17210 Nicotine dependence, cigarettes, uncomplicated: Secondary | ICD-10-CM | POA: Diagnosis present

## 2020-04-28 DIAGNOSIS — Z888 Allergy status to other drugs, medicaments and biological substances status: Secondary | ICD-10-CM

## 2020-04-28 DIAGNOSIS — Z419 Encounter for procedure for purposes other than remedying health state, unspecified: Secondary | ICD-10-CM

## 2020-04-28 DIAGNOSIS — Z20822 Contact with and (suspected) exposure to covid-19: Secondary | ICD-10-CM | POA: Diagnosis present

## 2020-04-28 DIAGNOSIS — Z6830 Body mass index (BMI) 30.0-30.9, adult: Secondary | ICD-10-CM

## 2020-04-28 DIAGNOSIS — M4807 Spinal stenosis, lumbosacral region: Secondary | ICD-10-CM | POA: Diagnosis not present

## 2020-04-28 DIAGNOSIS — M48061 Spinal stenosis, lumbar region without neurogenic claudication: Secondary | ICD-10-CM | POA: Diagnosis present

## 2020-04-28 HISTORY — PX: ANTERIOR LUMBAR FUSION: SHX1170

## 2020-04-28 HISTORY — PX: ABDOMINAL EXPOSURE: SHX5708

## 2020-04-28 LAB — ABO/RH: ABO/RH(D): A POS

## 2020-04-28 SURGERY — ANTERIOR LUMBAR FUSION 1 LEVEL
Anesthesia: General | Site: Spine Lumbar

## 2020-04-28 MED ORDER — MEPERIDINE HCL 25 MG/ML IJ SOLN: 6.2500 mg | INTRAMUSCULAR | Status: DC | PRN

## 2020-04-28 MED ORDER — PHENYLEPHRINE HCL-NACL 10-0.9 MG/250ML-% IV SOLN
INTRAVENOUS | Status: DC | PRN
Start: 2020-04-28 — End: 2020-04-28
  Administered 2020-04-28: 50 ug/min via INTRAVENOUS

## 2020-04-28 MED ORDER — ASPIRIN 81 MG PO CHEW
81.0000 mg | CHEWABLE_TABLET | Freq: Every day | ORAL | Status: DC
  Administered 2020-04-29 – 2020-04-30 (×2): 81 mg via ORAL
  Filled 2020-04-28 (×2): qty 1

## 2020-04-28 MED ORDER — PROPOFOL 10 MG/ML IV BOLUS
INTRAVENOUS | Status: DC | PRN
  Administered 2020-04-28: 120 mg via INTRAVENOUS

## 2020-04-28 MED ORDER — OXYCODONE HCL 5 MG PO TABS
10.0000 mg | ORAL_TABLET | ORAL | Status: DC | PRN
  Administered 2020-04-28 – 2020-04-30 (×10): 10 mg via ORAL
  Filled 2020-04-28 (×10): qty 2

## 2020-04-28 MED ORDER — PHENYLEPHRINE 40 MCG/ML (10ML) SYRINGE FOR IV PUSH (FOR BLOOD PRESSURE SUPPORT)
PREFILLED_SYRINGE | INTRAVENOUS | Status: AC
  Filled 2020-04-28: qty 20

## 2020-04-28 MED ORDER — FLUOXETINE HCL 20 MG PO CAPS
40.0000 mg | ORAL_CAPSULE | Freq: Every day | ORAL | Status: DC
  Administered 2020-04-28 – 2020-04-30 (×3): 40 mg via ORAL
  Filled 2020-04-28 (×4): qty 2

## 2020-04-28 MED ORDER — THROMBIN 20000 UNITS EX SOLR
CUTANEOUS | Status: AC
  Filled 2020-04-28: qty 20000

## 2020-04-28 MED ORDER — HYDROXYZINE HCL 50 MG/ML IM SOLN
50.0000 mg | Freq: Four times a day (QID) | INTRAMUSCULAR | Status: DC | PRN
  Administered 2020-04-28 (×2): 50 mg via INTRAMUSCULAR
  Filled 2020-04-28 (×2): qty 1

## 2020-04-28 MED ORDER — AMLODIPINE BESYLATE 5 MG PO TABS
5.0000 mg | ORAL_TABLET | Freq: Every day | ORAL | Status: DC
  Administered 2020-04-29 – 2020-04-30 (×2): 5 mg via ORAL
  Filled 2020-04-28 (×2): qty 1

## 2020-04-28 MED ORDER — PHENOL 1.4 % MT LIQD: 1.0000 | OROMUCOSAL | Status: DC | PRN

## 2020-04-28 MED ORDER — ACETAMINOPHEN 10 MG/ML IV SOLN
INTRAVENOUS | Status: DC | PRN
  Administered 2020-04-28: 1000 mg via INTRAVENOUS

## 2020-04-28 MED ORDER — HYDROMORPHONE HCL 1 MG/ML IJ SOLN
0.2500 mg | INTRAMUSCULAR | Status: DC | PRN
  Administered 2020-04-28: 0.5 mg via INTRAVENOUS

## 2020-04-28 MED ORDER — ACETAMINOPHEN 325 MG PO TABS
650.0000 mg | ORAL_TABLET | ORAL | Status: DC | PRN
  Administered 2020-04-28 – 2020-04-30 (×4): 650 mg via ORAL
  Filled 2020-04-28 (×5): qty 2

## 2020-04-28 MED ORDER — BUPIVACAINE HCL (PF) 0.25 % IJ SOLN
INTRAMUSCULAR | Status: DC | PRN
  Administered 2020-04-28: 3 mL

## 2020-04-28 MED ORDER — HYDROMORPHONE HCL 1 MG/ML IJ SOLN
INTRAMUSCULAR | Status: AC
  Filled 2020-04-28: qty 1

## 2020-04-28 MED ORDER — DEXAMETHASONE SODIUM PHOSPHATE 10 MG/ML IJ SOLN
10.0000 mg | Freq: Once | INTRAMUSCULAR | Status: DC
  Filled 2020-04-28: qty 1

## 2020-04-28 MED ORDER — POTASSIUM CHLORIDE IN NACL 20-0.9 MEQ/L-% IV SOLN
INTRAVENOUS | Status: DC
  Filled 2020-04-28: qty 1000

## 2020-04-28 MED ORDER — SODIUM CHLORIDE 0.9% FLUSH: 3.0000 mL | INTRAVENOUS | Status: DC | PRN

## 2020-04-28 MED ORDER — PHENYLEPHRINE 40 MCG/ML (10ML) SYRINGE FOR IV PUSH (FOR BLOOD PRESSURE SUPPORT)
PREFILLED_SYRINGE | INTRAVENOUS | Status: DC | PRN
  Administered 2020-04-28 (×3): 80 ug via INTRAVENOUS

## 2020-04-28 MED ORDER — CHLORHEXIDINE GLUCONATE 4 % EX LIQD: 60.0000 mL | Freq: Once | CUTANEOUS | Status: DC

## 2020-04-28 MED ORDER — ROCURONIUM BROMIDE 10 MG/ML (PF) SYRINGE
PREFILLED_SYRINGE | INTRAVENOUS | Status: DC | PRN
  Administered 2020-04-28 (×3): 20 mg via INTRAVENOUS
  Administered 2020-04-28: 30 mg via INTRAVENOUS
  Administered 2020-04-28: 100 mg via INTRAVENOUS

## 2020-04-28 MED ORDER — THROMBIN 5000 UNITS EX SOLR
CUTANEOUS | Status: AC
  Filled 2020-04-28: qty 10000

## 2020-04-28 MED ORDER — 0.9 % SODIUM CHLORIDE (POUR BTL) OPTIME
TOPICAL | Status: DC | PRN
  Administered 2020-04-28: 1000 mL

## 2020-04-28 MED ORDER — ONDANSETRON HCL 4 MG/2ML IJ SOLN
INTRAMUSCULAR | Status: AC
  Filled 2020-04-28: qty 2

## 2020-04-28 MED ORDER — CHLORHEXIDINE GLUCONATE 0.12 % MT SOLN
OROMUCOSAL | Status: AC
  Administered 2020-04-28: 15 mL via OROMUCOSAL
  Filled 2020-04-28: qty 15

## 2020-04-28 MED ORDER — HYDROMORPHONE HCL 1 MG/ML IJ SOLN
INTRAMUSCULAR | Status: DC | PRN
  Administered 2020-04-28: .5 mg via INTRAVENOUS

## 2020-04-28 MED ORDER — METHOCARBAMOL 1000 MG/10ML IJ SOLN
500.0000 mg | Freq: Four times a day (QID) | INTRAVENOUS | Status: DC | PRN
  Filled 2020-04-28 (×2): qty 5

## 2020-04-28 MED ORDER — SODIUM CHLORIDE 0.9% FLUSH
3.0000 mL | Freq: Two times a day (BID) | INTRAVENOUS | Status: DC
  Administered 2020-04-28 – 2020-04-29 (×3): 3 mL via INTRAVENOUS

## 2020-04-28 MED ORDER — SUGAMMADEX SODIUM 200 MG/2ML IV SOLN
INTRAVENOUS | Status: DC | PRN
  Administered 2020-04-28: 220 mg via INTRAVENOUS

## 2020-04-28 MED ORDER — CHLORHEXIDINE GLUCONATE CLOTH 2 % EX PADS: 6.0000 | MEDICATED_PAD | Freq: Once | CUTANEOUS | Status: DC

## 2020-04-28 MED ORDER — ONDANSETRON HCL 4 MG/2ML IJ SOLN
INTRAMUSCULAR | Status: DC | PRN
  Administered 2020-04-28: 4 mg via INTRAVENOUS

## 2020-04-28 MED ORDER — CHLORHEXIDINE GLUCONATE 0.12 % MT SOLN: 15.0000 mL | Freq: Once | OROMUCOSAL | Status: AC

## 2020-04-28 MED ORDER — EPHEDRINE SULFATE 50 MG/ML IJ SOLN
INTRAMUSCULAR | Status: DC | PRN
  Administered 2020-04-28 (×3): 5 mg via INTRAVENOUS

## 2020-04-28 MED ORDER — MIDAZOLAM HCL 2 MG/2ML IJ SOLN
INTRAMUSCULAR | Status: AC
  Filled 2020-04-28: qty 2

## 2020-04-28 MED ORDER — ACETAMINOPHEN 10 MG/ML IV SOLN
INTRAVENOUS | Status: AC
  Filled 2020-04-28: qty 100

## 2020-04-28 MED ORDER — CEFAZOLIN SODIUM-DEXTROSE 2-4 GM/100ML-% IV SOLN
2.0000 g | INTRAVENOUS | Status: DC
  Filled 2020-04-28: qty 100

## 2020-04-28 MED ORDER — MIDAZOLAM HCL 5 MG/5ML IJ SOLN
INTRAMUSCULAR | Status: DC | PRN
  Administered 2020-04-28: 2 mg via INTRAVENOUS

## 2020-04-28 MED ORDER — PROPOFOL 10 MG/ML IV BOLUS
INTRAVENOUS | Status: AC
  Filled 2020-04-28: qty 20

## 2020-04-28 MED ORDER — LIDOCAINE 2% (20 MG/ML) 5 ML SYRINGE
INTRAMUSCULAR | Status: DC | PRN
  Administered 2020-04-28: 100 mg via INTRAVENOUS

## 2020-04-28 MED ORDER — HYDRALAZINE HCL 50 MG PO TABS
75.0000 mg | ORAL_TABLET | Freq: Three times a day (TID) | ORAL | Status: DC
  Administered 2020-04-28 – 2020-04-30 (×4): 75 mg via ORAL
  Filled 2020-04-28 (×8): qty 1

## 2020-04-28 MED ORDER — THROMBIN 20000 UNITS EX SOLR
CUTANEOUS | Status: DC | PRN
  Administered 2020-04-28: 20 mL

## 2020-04-28 MED ORDER — ORAL CARE MOUTH RINSE: 15.0000 mL | Freq: Once | OROMUCOSAL | Status: AC

## 2020-04-28 MED ORDER — HYDROMORPHONE HCL 1 MG/ML IJ SOLN
0.5000 mg | INTRAMUSCULAR | Status: DC | PRN
  Administered 2020-04-28 – 2020-04-29 (×4): 0.5 mg via INTRAVENOUS
  Filled 2020-04-28 (×4): qty 0.5

## 2020-04-28 MED ORDER — ONDANSETRON HCL 4 MG/2ML IJ SOLN
4.0000 mg | Freq: Once | INTRAMUSCULAR | Status: AC | PRN
  Administered 2020-04-28: 4 mg via INTRAVENOUS

## 2020-04-28 MED ORDER — MENTHOL 3 MG MT LOZG: 1.0000 | LOZENGE | OROMUCOSAL | Status: DC | PRN

## 2020-04-28 MED ORDER — THROMBIN 5000 UNITS EX SOLR
OROMUCOSAL | Status: DC | PRN
  Administered 2020-04-28: 5 mL

## 2020-04-28 MED ORDER — ONDANSETRON HCL 4 MG/2ML IJ SOLN
4.0000 mg | Freq: Four times a day (QID) | INTRAMUSCULAR | Status: DC | PRN
  Administered 2020-04-28: 4 mg via INTRAVENOUS
  Filled 2020-04-28: qty 2

## 2020-04-28 MED ORDER — CELECOXIB 200 MG PO CAPS
200.0000 mg | ORAL_CAPSULE | Freq: Two times a day (BID) | ORAL | Status: DC
  Administered 2020-04-28 – 2020-04-30 (×5): 200 mg via ORAL
  Filled 2020-04-28 (×5): qty 1

## 2020-04-28 MED ORDER — BUPIVACAINE HCL (PF) 0.25 % IJ SOLN
INTRAMUSCULAR | Status: AC
  Filled 2020-04-28: qty 30

## 2020-04-28 MED ORDER — LACTATED RINGERS IV SOLN: INTRAVENOUS | Status: DC | PRN

## 2020-04-28 MED ORDER — SENNA 8.6 MG PO TABS
1.0000 | ORAL_TABLET | Freq: Two times a day (BID) | ORAL | Status: DC
  Administered 2020-04-28 – 2020-04-30 (×4): 8.6 mg via ORAL
  Filled 2020-04-28 (×4): qty 1

## 2020-04-28 MED ORDER — DEXAMETHASONE SODIUM PHOSPHATE 10 MG/ML IJ SOLN
INTRAMUSCULAR | Status: DC | PRN
  Administered 2020-04-28: 10 mg via INTRAVENOUS

## 2020-04-28 MED ORDER — HYDROMORPHONE HCL 1 MG/ML IJ SOLN
INTRAMUSCULAR | Status: AC
  Filled 2020-04-28: qty 0.5

## 2020-04-28 MED ORDER — FENTANYL CITRATE (PF) 250 MCG/5ML IJ SOLN
INTRAMUSCULAR | Status: DC | PRN
  Administered 2020-04-28 (×5): 50 ug via INTRAVENOUS

## 2020-04-28 MED ORDER — METHOCARBAMOL 500 MG PO TABS
500.0000 mg | ORAL_TABLET | Freq: Four times a day (QID) | ORAL | Status: DC | PRN
  Administered 2020-04-28 – 2020-04-30 (×6): 500 mg via ORAL
  Filled 2020-04-28 (×6): qty 1

## 2020-04-28 MED ORDER — CEFAZOLIN SODIUM-DEXTROSE 2-4 GM/100ML-% IV SOLN
2.0000 g | Freq: Three times a day (TID) | INTRAVENOUS | Status: AC
  Administered 2020-04-28 (×2): 2 g via INTRAVENOUS
  Filled 2020-04-28 (×2): qty 100

## 2020-04-28 MED ORDER — DEXAMETHASONE SODIUM PHOSPHATE 10 MG/ML IJ SOLN
INTRAMUSCULAR | Status: AC
  Filled 2020-04-28: qty 1

## 2020-04-28 MED ORDER — ONDANSETRON HCL 4 MG PO TABS
4.0000 mg | ORAL_TABLET | Freq: Four times a day (QID) | ORAL | Status: DC | PRN
  Administered 2020-04-29: 4 mg via ORAL
  Filled 2020-04-28: qty 1

## 2020-04-28 MED ORDER — EPHEDRINE 5 MG/ML INJ
INTRAVENOUS | Status: AC
  Filled 2020-04-28: qty 10

## 2020-04-28 MED ORDER — SODIUM CHLORIDE 0.9 % IV SOLN
INTRAVENOUS | Status: DC | PRN
  Administered 2020-04-28: 500 mL

## 2020-04-28 MED ORDER — FENTANYL CITRATE (PF) 250 MCG/5ML IJ SOLN
INTRAMUSCULAR | Status: AC
  Filled 2020-04-28: qty 5

## 2020-04-28 MED ORDER — ACETAMINOPHEN 650 MG RE SUPP: 650.0000 mg | RECTAL | Status: DC | PRN

## 2020-04-28 MED ORDER — LACTATED RINGERS IV SOLN: INTRAVENOUS | Status: DC

## 2020-04-28 SURGICAL SUPPLY — 108 items
ADH SKN CLS APL DERMABOND .7 (GAUZE/BANDAGES/DRESSINGS) ×6
ANCH SPNL 25 LMBR MIS (Anchor) ×18 IMPLANT
ANCHOR LUMBAR 25 MIS (Anchor) ×12 IMPLANT
APL SKNCLS STERI-STRIP NONHPOA (GAUZE/BANDAGES/DRESSINGS) ×6
APPLIER CLIP 11 MED OPEN (CLIP) ×5
APR CLP MED 11 20 MLT OPN (CLIP) ×3
BAG DECANTER FOR FLEXI CONT (MISCELLANEOUS) ×10 IMPLANT
BASKET BONE COLLECTION (BASKET) ×5 IMPLANT
BENZOIN TINCTURE PRP APPL 2/3 (GAUZE/BANDAGES/DRESSINGS) ×7 IMPLANT
BLADE CLIPPER SURG (BLADE) IMPLANT
BUR BARREL STRAIGHT FLUTE 4.0 (BURR) IMPLANT
BUR CARBIDE MATCH 3.0 (BURR) ×5 IMPLANT
CANISTER SUCT 3000ML PPV (MISCELLANEOUS) ×8 IMPLANT
CARTRIDGE OIL MAESTRO DRILL (MISCELLANEOUS) ×3 IMPLANT
CLIP APPLIE 11 MED OPEN (CLIP) ×3 IMPLANT
CLIP LIGATING EXTRA MED SLVR (CLIP) ×3 IMPLANT
CLIP LIGATING EXTRA SM BLUE (MISCELLANEOUS) ×3 IMPLANT
CLOSURE WOUND 1/2 X4 (GAUZE/BANDAGES/DRESSINGS) ×2
CNTNR URN SCR LID CUP LEK RST (MISCELLANEOUS) ×3 IMPLANT
CONT SPEC 4OZ STRL OR WHT (MISCELLANEOUS)
COVER BACK TABLE 60X90IN (DRAPES) ×5 IMPLANT
COVER WAND RF STERILE (DRAPES) ×13 IMPLANT
DERMABOND ADVANCED (GAUZE/BANDAGES/DRESSINGS) ×4
DERMABOND ADVANCED .7 DNX12 (GAUZE/BANDAGES/DRESSINGS) ×6 IMPLANT
DIFFUSER DRILL AIR PNEUMATIC (MISCELLANEOUS) ×5 IMPLANT
DRAPE C-ARM 42X72 X-RAY (DRAPES) ×19 IMPLANT
DRAPE C-ARMOR (DRAPES) ×4 IMPLANT
DRAPE LAPAROTOMY 100X72X124 (DRAPES) ×10 IMPLANT
DRAPE SURG 17X23 STRL (DRAPES) ×5 IMPLANT
DRSG OPSITE POSTOP 3X4 (GAUZE/BANDAGES/DRESSINGS) ×2 IMPLANT
DRSG OPSITE POSTOP 4X6 (GAUZE/BANDAGES/DRESSINGS) ×2 IMPLANT
DRSG OPSITE POSTOP 4X8 (GAUZE/BANDAGES/DRESSINGS) ×2 IMPLANT
DURAPREP 26ML APPLICATOR (WOUND CARE) ×10 IMPLANT
ELECT BLADE 4.0 EZ CLEAN MEGAD (MISCELLANEOUS) ×5
ELECT REM PT RETURN 9FT ADLT (ELECTROSURGICAL) ×10
ELECTRODE BLDE 4.0 EZ CLN MEGD (MISCELLANEOUS) ×6 IMPLANT
ELECTRODE REM PT RTRN 9FT ADLT (ELECTROSURGICAL) ×6 IMPLANT
EVACUATOR 1/8 PVC DRAIN (DRAIN) ×3 IMPLANT
GAUZE 4X4 16PLY RFD (DISPOSABLE) IMPLANT
GAUZE SPONGE 4X4 12PLY STRL (GAUZE/BANDAGES/DRESSINGS) ×5 IMPLANT
GLOVE BIO SURGEON STRL SZ7 (GLOVE) IMPLANT
GLOVE BIO SURGEON STRL SZ8 (GLOVE) ×16 IMPLANT
GLOVE BIOGEL PI IND STRL 7.0 (GLOVE) IMPLANT
GLOVE BIOGEL PI INDICATOR 7.0 (GLOVE) ×4
GLOVE SS BIOGEL STRL SZ 7.5 (GLOVE) ×3 IMPLANT
GLOVE SUPERSENSE BIOGEL SZ 7.5 (GLOVE)
GLOVE SURG SS PI 7.5 STRL IVOR (GLOVE) ×6 IMPLANT
GOWN STRL REUS W/ TWL LRG LVL3 (GOWN DISPOSABLE) ×3 IMPLANT
GOWN STRL REUS W/ TWL XL LVL3 (GOWN DISPOSABLE) ×6 IMPLANT
GOWN STRL REUS W/TWL 2XL LVL3 (GOWN DISPOSABLE) ×3 IMPLANT
GOWN STRL REUS W/TWL LRG LVL3 (GOWN DISPOSABLE) ×5
GOWN STRL REUS W/TWL XL LVL3 (GOWN DISPOSABLE) ×15
GRAFT TRIN ELITE MED MUSC TRAN (Graft) ×4 IMPLANT
GRAFT TRINITY ELITE LGE HUMAN (Tissue) ×2 IMPLANT
HEMOSTAT POWDER KIT SURGIFOAM (HEMOSTASIS) ×2 IMPLANT
INSERT FOGARTY 61MM (MISCELLANEOUS) IMPLANT
INSERT FOGARTY SM (MISCELLANEOUS) IMPLANT
KIT BASIN OR (CUSTOM PROCEDURE TRAY) ×10 IMPLANT
KIT TURNOVER KIT B (KITS) ×10 IMPLANT
LOOP VESSEL MAXI BLUE (MISCELLANEOUS) IMPLANT
LOOP VESSEL MINI RED (MISCELLANEOUS) IMPLANT
MILL MEDIUM DISP (BLADE) IMPLANT
NDL HYPO 25X1 1.5 SAFETY (NEEDLE) ×3 IMPLANT
NDL SPNL 18GX3.5 QUINCKE PK (NEEDLE) ×3 IMPLANT
NEEDLE HYPO 25X1 1.5 SAFETY (NEEDLE) ×5 IMPLANT
NEEDLE SPNL 18GX3.5 QUINCKE PK (NEEDLE) ×5 IMPLANT
NS IRRIG 1000ML POUR BTL (IV SOLUTION) ×10 IMPLANT
OIL CARTRIDGE MAESTRO DRILL (MISCELLANEOUS)
PACK LAMINECTOMY NEURO (CUSTOM PROCEDURE TRAY) ×10 IMPLANT
PAD ARMBOARD 7.5X6 YLW CONV (MISCELLANEOUS) ×19 IMPLANT
PUTTY DBM 10CC ×2 IMPLANT
ROD LORD LIPPED TI 5.5X60 (Rod) ×4 IMPLANT
SCREW CANC SHANK MOD 6.5X45 (Screw) ×8 IMPLANT
SCREW CORT SHANK MOD 6.5X40 (Screw) ×4 IMPLANT
SCREW POLYAXIAL TULIP (Screw) ×12 IMPLANT
SET SCREW (Screw) ×30 IMPLANT
SET SCREW SPNE (Screw) IMPLANT
SPACER HEDRON IA 29X39X13 8D (Spacer) ×2 IMPLANT
SPACER HEDRON IA 29X39X15 8D (Spacer) ×2 IMPLANT
SPACER HEDRON IA 29X39X21 8D (Spacer) ×2 IMPLANT
SPONGE INTESTINAL PEANUT (DISPOSABLE) ×5 IMPLANT
SPONGE LAP 18X18 RF (DISPOSABLE) ×5 IMPLANT
SPONGE LAP 4X18 RFD (DISPOSABLE) IMPLANT
SPONGE SURGIFOAM ABS GEL 100 (HEMOSTASIS) ×5 IMPLANT
STAPLER VISISTAT 35W (STAPLE) IMPLANT
STRIP CLOSURE SKIN 1/2X4 (GAUZE/BANDAGES/DRESSINGS) ×8 IMPLANT
SUT PROLENE 4 0 RB 1 (SUTURE)
SUT PROLENE 4-0 RB1 .5 CRCL 36 (SUTURE) IMPLANT
SUT PROLENE 5 0 CC1 (SUTURE) IMPLANT
SUT PROLENE 6 0 C 1 30 (SUTURE) ×3 IMPLANT
SUT PROLENE 6 0 CC (SUTURE) IMPLANT
SUT SILK 0 TIES 10X30 (SUTURE) ×5 IMPLANT
SUT SILK 2 0 TIES 10X30 (SUTURE) ×5 IMPLANT
SUT SILK 2 0SH CR/8 30 (SUTURE) IMPLANT
SUT SILK 3 0 TIES 10X30 (SUTURE) ×5 IMPLANT
SUT SILK 3 0SH CR/8 30 (SUTURE) IMPLANT
SUT VIC AB 0 CT1 18XCR BRD8 (SUTURE) ×6 IMPLANT
SUT VIC AB 0 CT1 27 (SUTURE) ×5
SUT VIC AB 0 CT1 27XBRD ANBCTR (SUTURE) ×6 IMPLANT
SUT VIC AB 0 CT1 8-18 (SUTURE) ×5
SUT VIC AB 2-0 CP2 18 (SUTURE) ×10 IMPLANT
SUT VIC AB 3-0 SH 8-18 (SUTURE) ×13 IMPLANT
SUT VICRYL 4-0 PS2 18IN ABS (SUTURE) ×5 IMPLANT
SYR CONTROL 10ML LL (SYRINGE) ×5 IMPLANT
TOWEL GREEN STERILE (TOWEL DISPOSABLE) ×15 IMPLANT
TOWEL GREEN STERILE FF (TOWEL DISPOSABLE) ×14 IMPLANT
TRAY FOLEY MTR SLVR 16FR STAT (SET/KITS/TRAYS/PACK) ×8 IMPLANT
WATER STERILE IRR 1000ML POUR (IV SOLUTION) ×10 IMPLANT

## 2020-04-28 NOTE — Transfer of Care (Signed)
Immediate Anesthesia Transfer of Care Note  Patient: RUAIRI STUTSMAN  Procedure(s) Performed: Anterior Lumbar Interbody Fusion - Lumbar Four-Lumbar Five - Lumbar Five-Sacral One (N/A Spine Lumbar) Posterior segmental fixation Lumbar Four-Sacral One (N/A Back) ABDOMINAL EXPOSURE (N/A Abdomen)  Patient Location: PACU  Anesthesia Type:General  Level of Consciousness: drowsy  Airway & Oxygen Therapy: Patient Spontanous Breathing and Patient connected to face mask oxygen  Post-op Assessment: Report given to RN, Post -op Vital signs reviewed and stable and Patient moving all extremities  Post vital signs: Reviewed and stable  Last Vitals:  Vitals Value Taken Time  BP    Temp    Pulse    Resp    SpO2      Last Pain:  Vitals:   04/28/20 0637  TempSrc:   PainSc: 10-Worst pain ever      Patients Stated Pain Goal: 3 (41/44/36 0165)  Complications: No complications documented.

## 2020-04-28 NOTE — Op Note (Signed)
    OPERATIVE REPORT  DATE OF SURGERY: 04/28/2020  PATIENT: [name], 59 y.o. male MRN: [id number]  DOB: Dec 07, 1960  PRE-OPERATIVE DIAGNOSIS: Degenerative disc disease  POST-OPERATIVE DIAGNOSIS:  Same  PROCEDURE: Anterior exposure for L4-5 and L5-S1 disc fusion  SURGEON:  Curt Jews, M.D.  Co-surgeon for the exposure Dr. Sherley Bounds  ANESTHESIA: General  EBL: per anesthesia record  Total I/O In: 1300 [I.V.:1000; IV Piggyback:300] Out: 125 [Urine:125]  BLOOD ADMINISTERED: none  DRAINS: none  SPECIMEN: none  COUNTS CORRECT:  YES  PATIENT DISPOSITION:  PACU - hemodynamically stable  PROCEDURE DETAILS: Patient was taken operating placed supine position where the area of the abdomen was prepped draped in sterile fashion.  C-arm was brought onto the field and the level of the L4-5 and L5-S1 disc were marked on the anterior abdominal wall.  A left paramedian incision was made overlying these areas.  The incision was carried down to the anterior rectus sheath.  The rectus anterior rectus sheath was opened in line with the skin incision.  The rectus muscle was mobilized to the right.  Retroperitoneal space was entered and the intraperitoneal contents were mobilized to the right.  The left ureter was mobilized to the right.  Dissection was continued above the level of the psoas muscle down to the level of the L5-S1 disc.  Blunt dissection was continued between the level of the L5-S1 disc.  The middle sacral vessels were clipped and divided.  Next attention was turned to the L4-5 disc.  This was approached by mobilizing the arterial venous structures to the right.  The patient had a large single iliolumbar vein and this was ligated with a 2-0 silk tie divided.  Blunt dissection was used to give adequate exposure to the right and left of the L4-5 disc.  The Thompson retractor was brought onto the field and the reverse lip 150 blades were positioned to the right and left of the L5-S1  disc.  Malleable retractors were used for superior and inferior exposure.  A spinal needle was placed in the L4-5 disc and C arm was brought back onto the field to confirm this was the appropriate level the discectomy and fusion will be dictated as a separate note by Dr. Ronnald Ramp.  I have been rescrubbed and remove the retractor system from the L4-5 layer level.  The 150 reverse lip blades were positioned to the right and left of the L5-S1 disc.  Malleable retractors again were used for superior and inferior exposure.  Spinal needle was placed in the L5-S1 disc and C-arm was brought back onto the field to confirm this was the appropriate level.  The remainder the procedure will be dictated as a separate note by Dr. Richardson Landry, M.D., [facility] 04/28/2020 12:24 PM

## 2020-04-28 NOTE — Op Note (Signed)
04/28/2020  1:05 PM  PATIENT:  [name]  59 y.o. male  PRE-OPERATIVE DIAGNOSIS: Degenerative spondylolisthesis L4-5 L5-S1, facet arthropathy L4-5 L5-S1, spinal stenosis, back and leg pain  POST-OPERATIVE DIAGNOSIS:  same  PROCEDURE:   1. Decompressive lumbar laminectomy , medial facetectomy and foraminotomies L5-S1 bilateral  2.  Anterior lumbar interbody fusion L4-5 L5-S1 using globus 3D printed titanium interbody cages packed with morcellized allograft with 25 mm anchors 3. Posterior fixation L4-S1 inclusive using Alphatec cortical pedicle screws.  4. Intertransverse arthrodesis L4 to S1 bilateral using morcellized autograft and allograft.  SURGEON:  Sherley Bounds, MD  Co-surgeon: Dr. Sherren Mocha early  ASSISTANTS: Glenford Peers, FNP  ANESTHESIA:  General  EBL: 200 ml  Total I/O In: 2300 [I.V.:2000; IV Piggyback:300] Out: 175 [Urine:175]  BLOOD ADMINISTERED:none  DRAINS: none   INDICATION FOR PROCEDURE: This patient presented with back and leg pain. Imaging revealed degenerative spondylolisthesis L4-5 and L5-S1. The patient tried a reasonable attempt at conservative medical measures without relief. I recommended decompression and instrumented fusion to address the stenosis as well as the segmental  instability.  Patient understood the risks, benefits, and alternatives and potential outcomes and wished to proceed.  PROCEDURE DETAILS:  The patient was brought to the operating room. After induction of generalized endotracheal anesthesia the patient was placed in the supine position on the operating room table.  His exposure was performed by Dr. Donnetta Hutching of vascular surgery and this will be described in a separate operative report.  Once the exposure was completed we started at L4-5 and marked the midline with a needle in AP fluoroscopy.  The exact same fusion was performed at both L4-5 and L5-S1.  We incised the disc space and elevated the disc from the endplates utilizing a Cobb  elevator.  We then remove the disc en bloc.  We then used a series of scrapers and curettes to prepare the endplates.  The high-speed drill was also used prepare the endplates for arthrodesis.  We then started with sequential trials.  We started with 13 mm trials and worked up to a 15 mm trial at L4-5 and a 21 mm trial at L5-S1.  We checked this with lateral fluoroscopy.  We liked the fit of each trial and therefore chose a 15 mm x 8 degree large cage for L4-5 and a 21 mm 8 degree large cage for L5-S1.  These were packed with morselized allograft and then tapped into position utilizing the inserter.  Once the cage was in position we placed 1 anchor up in 2 anchors down into the vertebral bodies, and remove the inserter and then locked the anchors into position at both levels.  The wound was irrigated.  We checked our final construct with AP and lateral fluoroscopy.  A AP abdominal film was shot to rule out retained instrument or sponge.  We saw none.  We remove the retractor and saw no bleeding.  We therefore closed the rectus sheath with a running 0 Prolene.  The subcutaneous tissues were closed with 2-0 Vicryl and the subcuticular tissue with 3-0 Vicryl.  The skin was closed with Dermabond.  A sterile dressing was applied.    The patient was then rolled into the prone position on chest rolls and all pressure points were padded. The patient's lumbar region was cleaned and then prepped with DuraPrep and draped in the usual sterile fashion. Anesthesia was injected and then a dorsal midline incision was made and carried down to the lumbosacral fascia. The fascia  was opened and the paraspinous musculature was taken down in a subperiosteal fashion to expose L4-5 and L5-S1. A self-retaining retractor was placed. Intraoperative fluoroscopy confirmed my level, and I started with placement of the L4 and L5 cortical pedicle screws. The pedicle screw entry zones were identified utilizing surface landmarks and  AP and  lateral fluoroscopy. I scored the cortex with the high-speed drill and then used the hand drill to drill an upward and outward direction into the pedicle. I then tapped line to line. I then placed a 6.5 x 40 mm cortical pedicle screw into the pedicles of L4 and L5 bilaterally.  We then used a high-speed drill and Kerrison punches to perform a hemilaminectomy and hemifacetectomy and foraminotomy at L5-S1 to identify the S1 pedicles.  The drill shavings were saved in a mucous trap for arthrodesis.  Bone removed with the Kerrison was also saved for arthrodesis.  The pedicle screw entry zones were identified utilizing surface landmarks and fluoroscopy. I drilled into each pedicle utilizing the hand drill, and tapped each pedicle with the appropriate tap. We palpated with a ball probe to assure no break in the cortex. We then placed 6.5 x 40 mm pedicle screws into the pedicles bilaterally at S1.  My nurse practitioner assisted in placement of the pedicle screws.  We then decorticated the transverse processes and laid a mixture of morcellized autograft and allograft out over these to perform intertransverse arthrodesis at L4-S1 bilaterally. We then placed lordotic rods into the multiaxial screw heads of the pedicle screws and locked these in position with the locking caps and anti-torque device. We then checked our construct with AP and lateral fluoroscopy. Irrigated with copious amounts of bacitracin-containing saline solution. Inspected the nerve roots once again to assure adequate decompression, lined to the dura with Gelfoam,  and then we closed the muscle and the fascia with 0 Vicryl. Closed the subcutaneous tissues with 2-0 Vicryl and subcuticular tissues with 3-0 Vicryl. The skin was closed with benzoin and Steri-Strips. Dressing was then applied, the patient was awakened from general anesthesia and transported to the recovery room in stable condition. At the end of the procedure all sponge, needle and instrument  counts were correct.   PLAN OF CARE: admit to inpatient  PATIENT DISPOSITION:  PACU - hemodynamically stable.   Delay start of Pharmacological VTE agent (>24hrs) due to surgical blood loss or risk of bleeding:  yes

## 2020-04-28 NOTE — Anesthesia Procedure Notes (Signed)
Procedure Name: Intubation Date/Time: 04/28/2020 7:46 AM Performed by: Amadeo Garnet, CRNA Pre-anesthesia Checklist: Patient identified, Emergency Drugs available, Suction available and Patient being monitored Patient Re-evaluated:Patient Re-evaluated prior to induction Oxygen Delivery Method: Circle system utilized Preoxygenation: Pre-oxygenation with 100% oxygen Induction Type: IV induction Ventilation: Mask ventilation without difficulty and Oral airway inserted - appropriate to patient size Laryngoscope Size: Mac and 4 Grade View: Grade I Tube type: Oral Tube size: 7.5 mm Number of attempts: 1 Airway Equipment and Method: Stylet Placement Confirmation: ETT inserted through vocal cords under direct vision,  positive ETCO2 and breath sounds checked- equal and bilateral Secured at: 22 cm Tube secured with: Tape Dental Injury: Teeth and Oropharynx as per pre-operative assessment

## 2020-04-28 NOTE — H&P (Signed)
Subjective: Patient is a 59 y.o. male admitted for alif. Onset of symptoms was several months ago, gradually worsening since that time.  The pain is rated severe, and is located at the across the lower back and radiates to LLE. The pain is described as aching and occurs all day. The symptoms have been progressive. Symptoms are exacerbated by exercise. MRI or CT showed spondylolisthesis L4-5 L5-S1   Past Medical History:  Diagnosis Date  . Back pain   . Complication of anesthesia   . Depression   . H/O blood clots 2008  . Hypertension   . Migraine   . Obesity   . PONV (postoperative nausea and vomiting)   . Radiculopathy   . Sleep apnea   . Spondylolisthesis     Past Surgical History:  Procedure Laterality Date  . APPENDECTOMY    . HERNIA REPAIR    . KNEE SURGERY      Prior to Admission medications   Medication Sig Start Date End Date Taking? Authorizing Provider  ALPRAZolam Duanne Moron) 1 MG tablet Take one half to one qhs prn sleep Patient taking differently: Take 0.5-1 mg by mouth at bedtime as needed for anxiety or sleep.  01/18/20  Yes Mikey Kirschner, MD  amLODipine (NORVASC) 5 MG tablet Take 1 tablet (5 mg total) by mouth daily. Needs appt/labs for more refills. 04/11/20  Yes Lovena Le, [name], DO  aspirin EC 81 MG EC tablet Take 1 tablet (81 mg total) by mouth daily. 07/21/18  Yes Hongalgi, Lenis Dickinson, MD  hydrALAZINE (APRESOLINE) 50 MG tablet TAKE 1 AND 1/2 TABLETS BY MOUTH THREE TIMES DAILY 01/18/20  Yes Mikey Kirschner, MD  UNKNOWN TO PATIENT Inject as directed every 3 (three) months. INJECTIONS for MIGRAINES administered by Orie Rout, MD at [facility], [place], [place]   Yes [provider]  FLUoxetine (PROZAC) 20 MG tablet Take 40 mg by mouth daily.     [provider]   Allergies  Allergen Reactions  . Hydrocodone     nausea  . Toprol Xl [Metoprolol Tartrate] Other (See Comments)    sluggish  . Zoloft [Sertraline Hcl] Rash    Social  History   Tobacco Use  . Smoking status: Current Some Day Smoker    Packs/day: 0.25  . Smokeless tobacco: Never Used  . Tobacco comment: 3-4 cigerettes a day  Substance Use Topics  . Alcohol use: Yes    Comment: "occasionally"    Family History  Problem Relation Age of Onset  . Stroke Mother   . Heart disease Father   . Diabetes Father      Review of Systems  Positive ROS: neg  All other systems have been reviewed and were otherwise negative with the exception of those mentioned in the HPI and as above.  Objective: Vital signs in last 24 hours: Temp:  [98.6 F (37 C)] 98.6 F (37 C) (07/12 0605) Pulse Rate:  [92] 92 (07/12 0605) Resp:  [20] 20 (07/12 0605) BP: (168)/(96) 168/96 (07/12 0605) SpO2:  [100 %] 100 % (07/12 0605) Weight:  [102.1 kg] 102.1 kg (07/12 0605)  General Appearance: Alert, cooperative, no distress, appears stated age Head: Normocephalic, without obvious abnormality, atraumatic Eyes: PERRL, conjunctiva/corneas clear, EOM's intact    Neck: Supple, symmetrical, trachea midline Back: Symmetric, no curvature, ROM normal, no CVA tenderness Lungs:  respirations unlabored Heart: Regular rate and rhythm Abdomen: Soft, non-tender Extremities: Extremities normal, atraumatic, no cyanosis or edema Pulses: 2+ and symmetric all extremities  Skin: Skin color, texture, turgor normal, no rashes or lesions  NEUROLOGIC:   Mental status: Alert and oriented x4,  no aphasia, good attention span, fund of knowledge, and memory Motor Exam - grossly normal Sensory Exam - grossly normal Reflexes: 1+ Coordination - grossly normal Gait - grossly normal Balance - grossly normal Cranial Nerves: I: smell Not tested  II: visual acuity  OS: nl    OD: nl  II: visual fields Full to confrontation  II: pupils Equal, round, reactive to light  III,VII: ptosis None  III,IV,VI: extraocular muscles  Full ROM  V: mastication Normal  V: facial light touch sensation  Normal   V,VII: corneal reflex  Present  VII: facial muscle function - upper  Normal  VII: facial muscle function - lower Normal  VIII: hearing Not tested  IX: soft palate elevation  Normal  IX,X: gag reflex Present  XI: trapezius strength  5/5  XI: sternocleidomastoid strength 5/5  XI: neck flexion strength  5/5  XII: tongue strength  Normal    Data Review Lab Results  Component Value Date   WBC 7.8 04/24/2020   HGB 14.7 04/24/2020   HCT 47.9 04/24/2020   MCV 72.8 (L) 04/24/2020   PLT 240 04/24/2020   Lab Results  Component Value Date   NA 137 04/24/2020   K 5.0 04/24/2020   CL 104 04/24/2020   CO2 25 04/24/2020   BUN 30 (H) 04/24/2020   CREATININE 1.88 (H) 04/24/2020   GLUCOSE 102 (H) 04/24/2020   Lab Results  Component Value Date   INR 1.1 04/24/2020    Assessment/Plan:  Estimated body mass index is 30.53 kg/m as calculated from the following:   Height as of this encounter: 6' (1.829 m).   Weight as of this encounter: 102.1 kg. Patient admitted for ALIF L4-5 L5-S1. Patient has failed a reasonable attempt at conservative therapy.  I explained the condition and procedure to the patient and answered any questions.  Patient wishes to proceed with procedure as planned. Understands risks/ benefits and typical outcomes of procedure.   [name] 04/28/2020 7:31 AM

## 2020-04-28 NOTE — Anesthesia Postprocedure Evaluation (Signed)
Anesthesia Post Note  Patient: BLAYDEN CONWELL  Procedure(s) Performed: Anterior Lumbar Interbody Fusion - Lumbar Four-Lumbar Five - Lumbar Five-Sacral One (N/A Spine Lumbar) Posterior segmental fixation Lumbar Four-Sacral One (N/A Back) ABDOMINAL EXPOSURE (N/A Abdomen)     Patient location during evaluation: PACU Anesthesia Type: General Level of consciousness: awake and alert Pain management: pain level controlled Vital Signs Assessment: post-procedure vital signs reviewed and stable Respiratory status: spontaneous breathing, nonlabored ventilation, respiratory function stable and patient connected to nasal cannula oxygen Cardiovascular status: blood pressure returned to baseline and stable Postop Assessment: no apparent nausea or vomiting Anesthetic complications: no   No complications documented.  Last Vitals:  Vitals:   04/28/20 1422 04/28/20 1439  BP: (!) 91/54 (!) 94/51  Pulse: 69 77  Resp: 20 (!) 21  Temp:    SpO2: 98% 98%    Last Pain:  Vitals:   04/28/20 1408  TempSrc:   PainSc: Asleep                 Alnita Aybar DAVID

## 2020-04-29 ENCOUNTER — Encounter (HOSPITAL_COMMUNITY): Payer: Self-pay | Admitting: Neurological Surgery

## 2020-04-29 DIAGNOSIS — G4730 Sleep apnea, unspecified: Secondary | ICD-10-CM | POA: Diagnosis not present

## 2020-04-29 MED ORDER — BISACODYL 5 MG PO TBEC
10.0000 mg | DELAYED_RELEASE_TABLET | Freq: Every day | ORAL | Status: DC | PRN
  Administered 2020-04-29: 10 mg via ORAL
  Filled 2020-04-29: qty 2

## 2020-04-29 MED ORDER — MUPIROCIN 2 % EX OINT
1.0000 "application " | TOPICAL_OINTMENT | Freq: Two times a day (BID) | CUTANEOUS | Status: DC
  Administered 2020-04-29 – 2020-04-30 (×3): 1 via NASAL
  Filled 2020-04-29: qty 22

## 2020-04-29 MED ORDER — CHLORHEXIDINE GLUCONATE CLOTH 2 % EX PADS
6.0000 | MEDICATED_PAD | Freq: Every day | CUTANEOUS | Status: DC
  Administered 2020-04-29 – 2020-04-30 (×2): 6 via TOPICAL

## 2020-04-29 NOTE — Progress Notes (Signed)
Patient ID: [name], male   DOB: Mar 15, 1961, 59 y.o.   MRN: [id number]  Progress Note    04/29/2020 7:48 AM 1 Day Post-Op  Subjective: Reports significant abdominal and back pain.  Has been up walking several times.   Vitals:   04/29/20 0303 04/29/20 0736  BP: 129/74 133/76  Pulse: 92 96  Resp: 14 18  Temp: 99.5 F (37.5 C) 99.3 F (37.4 C)  SpO2: 98% 95%   Physical Exam: Abdomen soft.  Dressing intact.  2+ posterior tibial pulses bilaterally  CBC    Component Value Date/Time   WBC 7.8 04/24/2020 1155   RBC 6.58 (H) 04/24/2020 1155   HGB 14.7 04/24/2020 1155   HGB 13.4 07/31/2018 1137   HCT 47.9 04/24/2020 1155   HCT 40.0 07/31/2018 1137   PLT 240 04/24/2020 1155   PLT 214 07/31/2018 1137   MCV 72.8 (L) 04/24/2020 1155   MCV 68 (L) 07/31/2018 1137   MCH 22.3 (L) 04/24/2020 1155   MCHC 30.7 04/24/2020 1155   RDW 16.8 (H) 04/24/2020 1155   RDW 14.7 07/31/2018 1137   LYMPHSABS 1.2 04/24/2020 1155   LYMPHSABS 0.9 07/31/2018 1137   MONOABS 0.6 04/24/2020 1155   EOSABS 0.1 04/24/2020 1155   EOSABS 0.3 07/31/2018 1137   BASOSABS 0.0 04/24/2020 1155   BASOSABS 0.0 07/31/2018 1137    BMET    Component Value Date/Time   NA 137 04/24/2020 1155   NA 140 08/03/2018 1254   K 5.0 04/24/2020 1155   CL 104 04/24/2020 1155   CO2 25 04/24/2020 1155   GLUCOSE 102 (H) 04/24/2020 1155   BUN 30 (H) 04/24/2020 1155   BUN 21 08/03/2018 1254   CREATININE 1.88 (H) 04/24/2020 1155   CALCIUM 9.4 04/24/2020 1155   GFRNONAA 38 (L) 04/24/2020 1155   GFRAA 44 (L) 04/24/2020 1155    INR    Component Value Date/Time   INR 1.1 04/24/2020 1155     Intake/Output Summary (Last 24 hours) at 04/29/2020 0748 Last data filed at 04/28/2020 2338 Gross per 24 hour  Intake 2100 ml  Output 450 ml  Net 1650 ml     Assessment/Plan:  59 y.o. male stable postop day 1 from anterior exposure L4-5 and L5-S1.  Continued activity per Dr. Ronnald Ramp.  Please call if we can  assist     Rosetta Posner, MD [facility] [phone] 04/29/2020 7:48 AM

## 2020-04-29 NOTE — Evaluation (Signed)
Physical Therapy Evaluation Patient Details Name: [name] MRN: [id number] DOB: 05/21/1961 Today's Date: 04/29/2020   History of Present Illness  59 yo male s/p ALIF L4-5 and L5-S1 fusion on 04/28/2020 PMH back pain, depression, HTN, migraines, Blood clots, obesity, OSA, herina repair, knee surgery, radiculopathy  Clinical Impression  Pt is a pleasant 60 yo male. Pt notes severe pain post-op and shows apprehension to movement due to pain. Pt demonstrates moderate fatigue as noted by increased respirations with climbing flight of stairs. Pt was able to ambulate well w/ 2 walker and stair climb w/ supervision guard without abberant movement. Pt was educated on proper body mechanics w/ bed mobility and transfer. Pt returned demonstration w/ proper bed mobility mechanics; however, he showed some weakness in L LE as evidenced by minimal guard with leg swing in sit to supine. He states that he would like to be out of hospital.     Follow Up Recommendations Home health PT    Equipment Recommendations  Rolling walker with 5" wheels    Recommendations for Other Services       Precautions / Restrictions Precautions Precautions: Back Precaution Comments: back brace, provided handout for back precautions with adls Required Braces or Orthoses: Spinal Brace Spinal Brace: Lumbar corset;Applied in sitting position Restrictions Weight Bearing Restrictions: No      Mobility  Bed Mobility Overal bed mobility: min. Guard assist  Bed Mobility: Sit to Sidelying;Rolling Rolling: Modified independent (Device/Increase time)       Sit to sidelying: Min assist General bed mobility comments: pt needs min assit on L leg with sit to sidelying w/ cue to bring one leg up at a time; cued for proper log roll technique  Transfers Overall transfer level: Needs assistance Equipment used: Rolling walker (2 wheeled) Transfers: Sit to/from Stand Sit to Stand: Min guard         General transfer  comment: hand placement cues needed   Ambulation/Gait Ambulation/Gait assistance: Min guard Gait Distance (Feet): 100 Feet Assistive device: Rolling walker (2 wheeled) Gait Pattern/deviations: Step-through pattern;Step-to pattern Gait velocity: decreased  Gait velocity interpretation: <1.31 ft/sec, indicative of household ambulator General Gait Details: decreased swing phase in gait, cued pt to stay central to walker while ambulating   Stairs Stairs: Yes Stairs assistance: Min guard Stair Management: One rail Right Number of Stairs: 10 General stair comments: step to gait pattern; patient was educated on different stair stepping patterns , patient prefered forward descending pattern   Wheelchair Mobility    Modified Rankin (Stroke Patients Only)       Balance Overall balance assessment: Needs assistance Sitting-balance support: Feet supported;Bilateral upper extremity supported Sitting balance-Leahy Scale: Poor Sitting balance - Comments: pt was noted to use both UE to assist with sitting balance while adjusting self in chair    Standing balance support: Bilateral upper extremity supported Standing balance-Leahy Scale: Poor Standing balance comment: reliant on UE support on walker to maintain balance                             Pertinent Vitals/Pain Pain Assessment: Faces Pain Score: 9  Faces Pain Scale: Hurts even more Pain Location: anterior incision site and reports "the pain"  Pain Descriptors / Indicators: Discomfort;Operative site guarding Pain Intervention(s): Limited activity within patient's tolerance;Monitored during session;Repositioned    Home Living Family/patient expects to be discharged to:: Private residence Living Arrangements: Alone Available Help at Discharge: Available PRN/intermittently Type of Home: Apartment  Home Access: Stairs to enter Entrance Stairs-Rails: Right Entrance Stairs-Number of Steps: 19 Home Layout: One level Home  Equipment: None Additional Comments: drives, works for [employer] in [place] but is currently using FMLA time off for surgery. pt questions if he will return to work but then expressed concerns over finanical means if not working. pt reports working with [employer] for 15 years    Prior Function Level of Independence: Independent               Hand Dominance   Dominant Hand: Right    Extremity/Trunk Assessment   Upper Extremity Assessment Upper Extremity Assessment: Overall WFL for tasks assessed    Lower Extremity Assessment Lower Extremity Assessment: Defer to PT evaluation    Cervical / Trunk Assessment Cervical / Trunk Assessment: Other exceptions (s/p surgery)  Communication   Communication: No difficulties  Cognition Arousal/Alertness: Awake/alert Behavior During Therapy: WFL for tasks assessed/performed Overall Cognitive Status: Within Functional Limits for tasks assessed                                        General Comments General comments (skin integrity, edema, etc.): dressing sites dry and intact. pt provided photo on his personal photo so that he can visualize the wounds himself and maintain precautions    Exercises     Assessment/Plan    PT Assessment Patient needs continued PT services  PT Problem List Decreased balance;Decreased mobility;Decreased activity tolerance;Decreased strength;Cardiopulmonary status limiting activity;Pain;Decreased knowledge of use of DME;Decreased safety awareness;Decreased knowledge of precautions       PT Treatment Interventions Gait training;DME instruction;Stair training;Functional mobility training;Therapeutic activities;Therapeutic exercise;Balance training;Neuromuscular re-education;Patient/family education    PT Goals (Current goals can be found in the Care Plan section)  Acute Rehab PT Goals Patient Stated Goal: move back to his loft and live alone  PT Goal Formulation: With patient Time For  Goal Achievement: 05/06/20 Potential to Achieve Goals: Good    Frequency Min 5X/week   Barriers to discharge Decreased caregiver support;Inaccessible home environment pts home has 19 steps up to a loft and he lives alone in a loft over restarauntl; has the option to live at his daughter's home for short term care at d/c     Co-evaluation               AM-PAC PT "6 Clicks" Mobility  Outcome Measure Help needed turning from your back to your side while in a flat bed without using bedrails?: None Help needed moving from lying on your back to sitting on the side of a flat bed without using bedrails?: None Help needed moving to and from a bed to a chair (including a wheelchair)?: A Little Help needed standing up from a chair using your arms (e.g., wheelchair or bedside chair)?: A Little Help needed to walk in hospital room?: A Little Help needed climbing 3-5 steps with a railing? : A Little 6 Click Score: 20    End of Session Equipment Utilized During Treatment: Gait belt;Back brace Activity Tolerance: Patient limited by fatigue;Patient limited by pain Patient left: in bed;with call Tanav Orsak/phone within reach;with bed alarm set Nurse Communication: Patient requests pain meds PT Visit Diagnosis: Pain;Difficulty in walking, not elsewhere classified (R26.2);Unsteadiness on feet (R26.81) Pain - part of body:  (back)    Time: 4259-5638 PT Time Calculation (min) (ACUTE ONLY): 22 min   Charges:   PT Evaluation $PT  Eval Low Complexity: 1 Low         Gloriann Loan, SPT  Acute Rehabilitation Services  Office: [place] 04/29/2020, 12:37 PM

## 2020-04-29 NOTE — Progress Notes (Signed)
OT EVALUATION  Pt lives alone in a loft style apartment with 19 stairs to enter. Pt reports he can stay at daughter's home if needed and by end of session considering staying with daughter. Pt is high fall risk and demonstrates dizziness during this session. Recommend HHOT services and any additional aide/ RN support possible.    04/29/20 0900  OT Visit Information  Last OT Received On 04/29/20  Assistance Needed +1  History of Present Illness 59 yo male s/p ALIF L4-5 and L5-S1 fusion PMH back pain, depression, HTN, migraines, Blood clots, obesity, OSA, herina repair, knee surgery, radiculopathy  Precautions  Precautions Back  Precaution Comments back brace, provided handout for back precautions with adls  Required Braces or Orthoses Spinal Brace  Spinal Brace Lumbar corset;Applied in sitting position  Home Living  Family/patient expects to be discharged to: Private residence  Living Arrangements Alone  Available Help at Discharge Available PRN/intermittently  Type of [place] to enter  Entrance Stairs-Number of Steps [place] One level  Bathroom Programmer, multimedia None  Additional Comments drives, works for [employer] in [place] but is currently using FMLA time off for surgery. pt questions if he will return to work but then expressed concerns over finanical means if not working. pt reports working with [employer] for 15 years  Prior Function  Level of Independence Independent  Communication  Communication No difficulties  Pain Assessment  Pain Assessment 0-10  Pain Score 9  Pain Location anterior incision site and reports "the pain"   Pain Descriptors / Indicators Discomfort;Operative site guarding  Pain Intervention(s) Premedicated before session;Monitored during session;Repositioned  Cognition  Arousal/Alertness Awake/alert  Behavior During Therapy WFL for tasks  assessed/performed  Overall Cognitive Status Within Functional Limits for tasks assessed  Upper Extremity Assessment  Upper Extremity Assessment Overall WFL for tasks assessed  Lower Extremity Assessment  Lower Extremity Assessment Defer to PT evaluation  Cervical / Trunk Assessment  Cervical / Trunk Assessment Other exceptions (s/p surgery)  ADL  Overall ADL's  Needs assistance/impaired  Eating/Feeding Modified independent  Grooming Supervision/safety;Standing  Grooming Details (indicate cue type and reason) decrease fall risk if able to sit for task.  Upper Body Dressing  Minimal assistance;Standing  Upper Body Dressing Details (indicate cue type and reason) mod (A) to don brace. pt reports discomfort due to surgerical site  Lower Body Dressing Min guard;Sitting/lateral leans;Adhering to back precautions;With adaptive equipment  Lower Body Dressing Details (indicate cue type and reason) requires AE for dressing to maintain back precautions. pt has no insurance so will issue a Quarry manager guard;RW;Grab bars;Ambulation;Regular Radio producer Details (indicate cue type and reason) requires use of grab bar. pt reports sitting for all voiding even at baseline  Toileting- Water quality scientist and Hygiene Min guard  Functional mobility during ADLs Min guard;Rolling walker  General ADL Comments pt reports pain throughout session. pt educated on AE and requires for safety with precautions. spoke to patient about considering staying with daughter for safety for first few days upon d/c . pt with decreased BP noted after bathroom transfer  Vision- History  Baseline Vision/History Wears glasses  Bed Mobility  Overal bed mobility Modified Independent  General bed mobility comments educated on sequence  Transfers  Overall transfer level Needs assistance  Transfers Sit to/from Stand  Sit to Stand Min guard  General transfer comment benefits from elevated  surface and mod cues  for hand placement  Balance  Overall balance assessment Needs assistance  Sitting-balance support Feet supported;No upper extremity supported  Sitting balance-Leahy Scale Good  Standing balance support Single extremity supported  Standing balance-Leahy Scale Good  Standing balance comment reliant on UE support due to decrease balance  General Comments  General comments (skin integrity, edema, etc.) dressing sites dry and intact. pt provided photo on his personal photo so that he can visualize the wounds himself and maintain precautions  OT - End of Session  Equipment Utilized During Treatment Rolling walker;Back brace  Activity Tolerance Patient tolerated treatment well  Patient left in chair;with call bell/phone within reach;with nursing/sitter in room  Nurse Communication Mobility status;Precautions  OT Assessment  OT Recommendation/Assessment Patient needs continued OT Services  OT Visit Diagnosis Unsteadiness on feet (R26.81);Pain  OT Problem List Decreased activity tolerance;Impaired balance (sitting and/or standing);Decreased safety awareness;Decreased knowledge of use of DME or AE;Decreased knowledge of precautions;Pain  OT Plan  OT Frequency (ACUTE ONLY) Min 2X/week  OT Treatment/Interventions (ACUTE ONLY) Self-care/ADL training;Therapeutic exercise;DME and/or AE instruction;Balance training;Patient/family education;Therapeutic activities  AM-PAC OT "6 Clicks" Daily Activity Outcome Measure (Version 2)  Help from another person eating meals? 4  Help from another person taking care of personal grooming? 4  Help from another person toileting, which includes using toliet, bedpan, or urinal? 3  Help from another person bathing (including washing, rinsing, drying)? 3  Help from another person to put on and taking off regular upper body clothing? 4  Help from another person to put on and taking off regular lower body clothing? 3  6 Click Score 21  OT Recommendation  Follow Up  Recommendations Home health OT;Other (comment) (aide RN)  OT Equipment Other (comment) (RW)  Individuals Consulted  Consulted and Agree with Results and Recommendations Patient  Acute Rehab OT Goals  Patient Stated Goal to be able to move without pain  OT Goal Formulation With patient  Time For Goal Achievement 05/13/20  Potential to Achieve Goals Good  OT Time Calculation  OT Start Time (ACUTE ONLY) 0806  OT Stop Time (ACUTE ONLY) 0852  OT Time Calculation (min) 46 min  OT General Charges  $OT Visit 1 Visit  OT Evaluation  $OT Eval Moderate Complexity 1 Mod  OT Treatments  $Self Care/Home Management  23-37 mins  Written Expression  Dominant Hand Right    Brynn, OTR/L  Acute Rehabilitation Services Pager: [phone] Office: [phone] .

## 2020-04-29 NOTE — TOC Initial Note (Signed)
Transition of Care [facility]) - Initial/Assessment Note    Patient Details  Name: [name] MRN: [id number] Date of Birth: 01-25-1961  [facility]) CM/SW Contact:    Benard Halsted, LCSW Phone Number: 04/29/2020, 11:51 AM  Clinical Narrative:                 CSW received consult for possible home health services at time of discharge. CSW spoke with patient regarding PT recommendation of Home Health PT/OT at time of discharge. Patient reported that he would like home health services. He stated that his BCBS apparently is no longer active as of two weeks ago but he is not sure why. CSW encouraged him to contact them to find out the reason to see if he can resolve the issue. CSW sent referral for review with [facility] for [place] home health services. CSW provided Medicare [facility] ratings list. CSW confirmed PCP and address with patient. Patient states his daughter will come pick him up at discharge. No further questions reported at this time. CSW to continue to follow and assist with discharge planning needs.   Expected Discharge Plan: [place] Barriers to Discharge: Continued Medical Work up   Patient Goals and CMS Choice Patient states their goals for this hospitalization and ongoing recovery are:: Get stronger CMS Medicare.gov Compare Post Acute Care list provided to:: Patient Choice offered to / list presented to : Patient  Expected Discharge Plan and Services Expected Discharge Plan: [place] In-house Referral: Clinical Social Work Discharge Planning Services: CM Consult Post Acute Care Choice: [place] arrangements for the past 2 months: Single Family Home                           HH Arranged: PT [place]: [facility] [employer] Date [place]: 04/29/20 Time [place]: 0900 Representative spoke with at [place]: [place] Arrangements/Services Living  arrangements for the past 2 months: [place] Lives with:: Self Patient language and need for interpreter reviewed:: Yes Do you feel safe going back to the place where you live?: Yes      Need for Family Participation in Patient Care: No (Comment) Care giver support system in place?: Yes (comment)   Criminal Activity/Legal Involvement Pertinent to Current Situation/Hospitalization: No - Comment as needed  Activities of Daily Living      Permission Sought/Granted Permission sought to share information with : Facility Art therapist granted to share information with : Yes, Verbal Permission Granted     Permission granted to share info w AGENCY: Home Health        Emotional Assessment Appearance:: Appears stated age Attitude/Demeanor/Rapport: Engaged Affect (typically observed): Accepting, Appropriate Orientation: : Oriented to Self, Oriented to Place, Oriented to  Time, Oriented to Situation Alcohol / Substance Use: Not Applicable Psych Involvement: No (comment)  Admission diagnosis:  S/P lumbar fusion [Z98.1] Patient Active Problem List   Diagnosis Date Noted  . S/P lumbar fusion 04/28/2020  . Hypertensive kidney disease with CKD (chronic kidney disease) stage V ([place]) 11/13/2018  . Hypertensive emergency 07/14/2018  . SYNCOPE 01/09/2009  . SMOKER 08/28/2008  . DEEP VENOUS THROMBOPHLEBITIS-RIGHT UPPER EXTREMITY 08/28/2008  . MICROCYTOSIS 12/29/2007  . Hyperlipidemia 05/03/2007  . DEPRESSION, MAJOR 05/03/2007  . SLEEP APNEA, OBSTRUCTIVE, MILD 01/18/2007  . OBESITY NOS 09/22/2006  . DEPRESSION 09/22/2006  . Essential  hypertension 09/22/2006  . DVT, HX OF 09/22/2006   PCP:  Mikey Kirschner, MD Pharmacy:   [facility] [place], [address] AT [place] [address] Phone: [phone] Fax: [phone]     Social Determinants of Health (SDOH) Interventions    Readmission  Risk Interventions No flowsheet data found.

## 2020-04-29 NOTE — Progress Notes (Signed)
  Progress Note    04/29/2020 7:25 AM 1 Day Post-Op  Subjective:  States he is in a lot of pain in back and abdomen. Has been able to walk in the hallways. Voiding without difficulty   Vitals:   04/28/20 2346 04/29/20 0303  BP: 118/83 129/74  Pulse: 88 92  Resp:  14  Temp: 98.5 F (36.9 C) 99.5 F (37.5 C)  SpO2: 99% 98%   Physical Exam: Cardiac: regular Lungs: non labored Incisions: left paramedian incision clean, dry and intact with honeycomb dressing in place Extremities: moving all extremities without deficits. 2+ radial pulses, 2+ femoral and DP pulses bilaterally. Feet warm. Motor and sensation intact Abdomen:  Soft, non distended tenderness as expected at paramedian incision Neurologic: alert and oriented  CBC    Component Value Date/Time   WBC 7.8 04/24/2020 1155   RBC 6.58 (H) 04/24/2020 1155   HGB 14.7 04/24/2020 1155   HGB 13.4 07/31/2018 1137   HCT 47.9 04/24/2020 1155   HCT 40.0 07/31/2018 1137   PLT 240 04/24/2020 1155   PLT 214 07/31/2018 1137   MCV 72.8 (L) 04/24/2020 1155   MCV 68 (L) 07/31/2018 1137   MCH 22.3 (L) 04/24/2020 1155   MCHC 30.7 04/24/2020 1155   RDW 16.8 (H) 04/24/2020 1155   RDW 14.7 07/31/2018 1137   LYMPHSABS 1.2 04/24/2020 1155   LYMPHSABS 0.9 07/31/2018 1137   MONOABS 0.6 04/24/2020 1155   EOSABS 0.1 04/24/2020 1155   EOSABS 0.3 07/31/2018 1137   BASOSABS 0.0 04/24/2020 1155   BASOSABS 0.0 07/31/2018 1137    BMET    Component Value Date/Time   NA 137 04/24/2020 1155   NA 140 08/03/2018 1254   K 5.0 04/24/2020 1155   CL 104 04/24/2020 1155   CO2 25 04/24/2020 1155   GLUCOSE 102 (H) 04/24/2020 1155   BUN 30 (H) 04/24/2020 1155   BUN 21 08/03/2018 1254   CREATININE 1.88 (H) 04/24/2020 1155   CALCIUM 9.4 04/24/2020 1155   GFRNONAA 38 (L) 04/24/2020 1155   GFRAA 44 (L) 04/24/2020 1155    INR    Component Value Date/Time   INR 1.1 04/24/2020 1155     Intake/Output Summary (Last 24 hours) at 04/29/2020  0725 Last data filed at 04/28/2020 2338 Gross per 24 hour  Intake 2400 ml  Output 450 ml  Net 1950 ml     Assessment/Plan:  59 y.o. male is s/p Anterior exposure for L4-5 and L5-S1 disc fusion1 Day Post-Op. Extremities well perfused and warm with palpable 2+ pulses bilaterally. Pain control. Continue to mobilize. Tolerating diet. Has not moved bowels yet. Doing well from vascular standpoint    Karoline Caldwell, PA-C [facility] [phone] 04/29/2020 7:25 AM

## 2020-04-29 NOTE — TOC Progression Note (Addendum)
Transition of Care [facility]) - Progression Note    Patient Details  Name: [name] MRN: [id number] Date of Birth: 1961/08/20  Transition of Care [facility]) CM/SW [place], LCSW Phone Number: 04/29/2020, 12:03 PM  Clinical Narrative:    [facility] able to accept patient for [place] PT/OT. Will need orders and Face to Face. CSW reached out to [employer] for delivery of rolling walker through their special assistance program.   Expected Discharge Plan: [place] Barriers to Discharge: Continued Medical Work up  Expected Discharge Plan and Services Expected Discharge Plan: [place] In-house Referral: Clinical Social Work Discharge Planning Services: CM Consult Post Acute Care Choice: [place] arrangements for the past 2 months: Single Family Home                           HH Arranged: PT [place]: [facility] [employer] Date [place]: 04/29/20 Time [place]: 0900 Representative spoke with at [place]: [place] ([place]) Interventions    Readmission Risk Interventions No flowsheet data found.

## 2020-04-29 NOTE — Progress Notes (Signed)
Patient ID: [name], male   DOB: 01-24-1961, 59 y.o.   MRN: [id number] Subjective: Patient reports appropriate back soreness , no leg pan or NTW, walking well, no flatus  Objective: Vital signs in last 24 hours: Temp:  [97.3 F (36.3 C)-99.5 F (37.5 C)] 99.3 F (37.4 C) (07/13 0736) Pulse Rate:  [68-96] 96 (07/13 0736) Resp:  [11-22] 18 (07/13 0736) BP: (88-133)/(51-83) 133/76 (07/13 0736) SpO2:  [91 %-100 %] 95 % (07/13 0736)  Intake/Output from previous day: 07/12 0701 - 07/13 0700 In: 2400 [I.V.:2000; IV Piggyback:400] Out: 450 [Urine:300; Blood:150] Intake/Output this shift: No intake/output data recorded.  Neurologic: Grossly normal  Lab Results: Lab Results  Component Value Date   WBC 7.8 04/24/2020   HGB 14.7 04/24/2020   HCT 47.9 04/24/2020   MCV 72.8 (L) 04/24/2020   PLT 240 04/24/2020   Lab Results  Component Value Date   INR 1.1 04/24/2020   BMET Lab Results  Component Value Date   NA 137 04/24/2020   K 5.0 04/24/2020   CL 104 04/24/2020   CO2 25 04/24/2020   GLUCOSE 102 (H) 04/24/2020   BUN 30 (H) 04/24/2020   CREATININE 1.88 (H) 04/24/2020   CALCIUM 9.4 04/24/2020    Studies/Results: DG Lumbar Spine 2-3 Views  Result Date: 04/28/2020 CLINICAL DATA:  Lumbar fusion. EXAM: LUMBAR SPINE - 2-3 VIEW; DG C-ARM 1-60 MIN COMPARISON:  CT lumbar myelogram dated Feb 21, 2020. FLUOROSCOPY TIME:  162.7 seconds C-arm fluoroscopic images were obtained intraoperatively and submitted for post operative interpretation. FINDINGS: Frontal and lateral intraoperative fluoroscopic images of the lumbar spine demonstrate interval L4-S1 anterior interbody and posterior fusion. Alignment is normal. No acute osseous abnormality. IMPRESSION: Intraoperative fluoroscopic guidance for L4-S1 fusion. Electronically Signed   By: [name] M.D.   On: 04/28/2020 13:06   DG C-Arm 1-60 Min  Result Date: 04/28/2020 CLINICAL DATA:  Lumbar fusion. EXAM: LUMBAR SPINE - 2-3 VIEW;  DG C-ARM 1-60 MIN COMPARISON:  CT lumbar myelogram dated Feb 21, 2020. FLUOROSCOPY TIME:  162.7 seconds C-arm fluoroscopic images were obtained intraoperatively and submitted for post operative interpretation. FINDINGS: Frontal and lateral intraoperative fluoroscopic images of the lumbar spine demonstrate interval L4-S1 anterior interbody and posterior fusion. Alignment is normal. No acute osseous abnormality. IMPRESSION: Intraoperative fluoroscopic guidance for L4-S1 fusion. Electronically Signed   By: [name] M.D.   On: 04/28/2020 13:06   DG OR LOCAL ABDOMEN  Result Date: 04/28/2020 CLINICAL DATA:  Lumbar fusion. Intraoperative examination for retained foreign body. EXAM: OR LOCAL ABDOMEN COMPARISON:  Lumbar myelogram CT 02/21/2020 FINDINGS: Single AP supine view of the lumbar spine dated 10:28 hours. Postsurgical changes consistent with interval anterior lumbar interbody fusion at L4-5 and L5-S1. No unexpected foreign body or acute osseous findings identified. The visualized bowel gas pattern is normal. Changes of chronic right femoral head avascular necrosis without subchondral collapse. IMPRESSION: Intraoperative views following L4-5 and L5-S1 anterior fusion. No evidence of unexpected foreign body. These results were called by telephone at the time of interpretation on 04/28/2020 at 10:39 am to OR room 21 and discussed with representative of [name] , who verbally acknowledged these results. Electronically Signed   By: [name] M.D.   On: 04/28/2020 10:43    Assessment/Plan: Doing well, PT/OT/pain control, advance diet once flatus  Estimated body mass index is 30.53 kg/m as calculated from the following:   Height as of this encounter: 6' (1.829 m).   Weight as of this encounter: 102.1  kg.    LOS: 1 day    [name] 04/29/2020, 7:47 AM

## 2020-04-30 MED ORDER — METHOCARBAMOL 500 MG PO TABS: 500.0000 mg | ORAL_TABLET | Freq: Four times a day (QID) | ORAL | 1 refills | Status: AC | PRN

## 2020-04-30 MED ORDER — OXYCODONE HCL 10 MG PO TABS: 10.0000 mg | ORAL_TABLET | ORAL | 0 refills | Status: DC | PRN

## 2020-04-30 MED ORDER — OXYCODONE HCL 10 MG PO TABS: 10.0000 mg | ORAL_TABLET | ORAL | 0 refills | Status: AC | PRN

## 2020-04-30 MED ORDER — CELECOXIB 200 MG PO CAPS: 200.0000 mg | ORAL_CAPSULE | Freq: Two times a day (BID) | ORAL | 0 refills | Status: DC

## 2020-04-30 NOTE — Discharge Summary (Addendum)
Physician Discharge Summary  Patient ID: [name] MRN: [id number] DOB/AGE: 1961/06/24 59 y.o.  Admit date: 04/28/2020 Discharge date: 04/30/2020  Admission Diagnoses: spondylolisthesis, back and leg pain    Discharge Diagnoses: same   Discharged Condition: good  Hospital Course: The patient was admitted on 04/28/2020 and taken to the operating room where the patient underwent ALIF L45, L5-S1. The patient tolerated the procedure well and was taken to the recovery room and then to the floor in stable condition. The hospital course was routine. There were no complications. The wound remained clean dry and intact. Pt had appropriate back soreness. No complaints of leg pain or new N/T/W. The patient remained afebrile with stable vital signs, and tolerated a regular diet. The patient continued to increase activities, and pain was well controlled with oral pain medications.   Consults: None  Significant Diagnostic Studies:  Results for orders placed or performed during the hospital encounter of 04/28/20  ABO/Rh  Result Value Ref Range   ABO/RH(D)      A POS Performed at [facility], [place] [address], [place], [place] [address]     Chest 2 View  Result Date: 04/25/2020 CLINICAL DATA:  Pre-op respiratory exam for lumbar spine surgery. EXAM: CHEST - 2 VIEW COMPARISON:  07/07/2018 FINDINGS: The heart size and mediastinal contours are within normal limits. Chronic elevation of right hemidiaphragm is unchanged. Both lungs are clear. The visualized skeletal structures are unremarkable. IMPRESSION: No active cardiopulmonary disease. Electronically Signed   By: Marlaine Hind M.D.   On: 04/25/2020 08:10   DG Lumbar Spine 2-3 Views  Result Date: 04/28/2020 CLINICAL DATA:  Lumbar fusion. EXAM: LUMBAR SPINE - 2-3 VIEW; DG C-ARM 1-60 MIN COMPARISON:  CT lumbar myelogram dated Feb 21, 2020. FLUOROSCOPY TIME:  162.7 seconds C-arm fluoroscopic images were obtained intraoperatively and submitted for  post operative interpretation. FINDINGS: Frontal and lateral intraoperative fluoroscopic images of the lumbar spine demonstrate interval L4-S1 anterior interbody and posterior fusion. Alignment is normal. No acute osseous abnormality. IMPRESSION: Intraoperative fluoroscopic guidance for L4-S1 fusion. Electronically Signed   By: Titus Dubin M.D.   On: 04/28/2020 13:06   DG C-Arm 1-60 Min  Result Date: 04/28/2020 CLINICAL DATA:  Lumbar fusion. EXAM: LUMBAR SPINE - 2-3 VIEW; DG C-ARM 1-60 MIN COMPARISON:  CT lumbar myelogram dated Feb 21, 2020. FLUOROSCOPY TIME:  162.7 seconds C-arm fluoroscopic images were obtained intraoperatively and submitted for post operative interpretation. FINDINGS: Frontal and lateral intraoperative fluoroscopic images of the lumbar spine demonstrate interval L4-S1 anterior interbody and posterior fusion. Alignment is normal. No acute osseous abnormality. IMPRESSION: Intraoperative fluoroscopic guidance for L4-S1 fusion. Electronically Signed   By: Titus Dubin M.D.   On: 04/28/2020 13:06   DG OR LOCAL ABDOMEN  Result Date: 04/28/2020 CLINICAL DATA:  Lumbar fusion. Intraoperative examination for retained foreign body. EXAM: OR LOCAL ABDOMEN COMPARISON:  Lumbar myelogram CT 02/21/2020 FINDINGS: Single AP supine view of the lumbar spine dated 10:28 hours. Postsurgical changes consistent with interval anterior lumbar interbody fusion at L4-5 and L5-S1. No unexpected foreign body or acute osseous findings identified. The visualized bowel gas pattern is normal. Changes of chronic right femoral head avascular necrosis without subchondral collapse. IMPRESSION: Intraoperative views following L4-5 and L5-S1 anterior fusion. No evidence of unexpected foreign body. These results were called by telephone at the time of interpretation on 04/28/2020 at 10:39 am to OR room 21 and discussed with representative of DAVID JONES , who verbally acknowledged these results. Electronically Signed  By:  Richardean Sale M.D.   On: 04/28/2020 10:43    Antibiotics:  Anti-infectives (From admission, onward)   Start     Dose/Rate Route Frequency Ordered Stop   04/28/20 1600  ceFAZolin (ANCEF) IVPB 2g/100 mL premix        2 g 200 mL/hr over 30 Minutes Intravenous Every 8 hours 04/28/20 1532 04/29/20 0008   04/28/20 0843  bacitracin 50,000 Units in sodium chloride 0.9 % 500 mL irrigation  Status:  Discontinued          As needed 04/28/20 0844 04/28/20 1300   04/28/20 0600  vancomycin (VANCOREADY) IVPB 1500 mg/300 mL        1,500 mg 150 mL/hr over 120 Minutes Intravenous 120 min pre-op 04/25/20 0806 04/28/20 0738   04/28/20 0600  ceFAZolin (ANCEF) IVPB 2g/100 mL premix  Status:  Discontinued        2 g 200 mL/hr over 30 Minutes Intravenous On call to O.R. 04/28/20 0559 04/28/20 1530      Discharge Exam: Blood pressure 122/62, pulse 91, temperature 98.7 F (37.1 C), temperature source Oral, resp. rate 18, height 6' (1.829 m), weight 102.1 kg, SpO2 93 %. Neurologic: Grossly normal Dressings dry  Discharge Medications:   Allergies as of 04/30/2020      Reactions   Hydrocodone    nausea   Toprol Xl [metoprolol Tartrate] Other (See Comments)   sluggish   Zoloft [sertraline Hcl] Rash      Medication List    TAKE these medications   ALPRAZolam 1 MG tablet Commonly known as: XANAX Take one half to one qhs prn sleep What changed:   how much to take  how to take this  when to take this  reasons to take this  additional instructions   amLODipine 5 MG tablet Commonly known as: NORVASC Take 1 tablet (5 mg total) by mouth daily. Needs appt/labs for more refills.   aspirin 81 MG EC tablet Take 1 tablet (81 mg total) by mouth daily.   celecoxib 200 MG capsule Commonly known as: CELEBREX Take 1 capsule (200 mg total) by mouth every 12 (twelve) hours.   FLUoxetine 20 MG tablet Commonly known as: PROZAC Take 40 mg by mouth daily.   hydrALAZINE 50 MG tablet Commonly known  as: APRESOLINE TAKE 1 AND 1/2 TABLETS BY MOUTH THREE TIMES DAILY   methocarbamol 500 MG tablet Commonly known as: ROBAXIN Take 1 tablet (500 mg total) by mouth every 6 (six) hours as needed for muscle spasms.   Oxycodone HCl 10 MG Tabs Take 1 tablet (10 mg total) by mouth every 4 (four) hours as needed for severe pain ((score 7 to 10)).   UNKNOWN TO PATIENT Inject as directed every 3 (three) months. INJECTIONS for MIGRAINES administered by Orie Rout, MD at [facility], [place], [place]  (From admission, onward)         Start     Ordered   04/28/20 1533  DME Walker rolling  Once       Question:  Patient needs a walker to treat with the following condition  Answer:  S/P lumbar fusion   04/28/20 1532   04/28/20 1533  DME 3 n 1  Once        04/28/20 1532          Disposition: home with [facility]   Final Dx: ALIF L4-5 L5-S1  Discharge Instructions  Call MD for:  difficulty breathing, headache or visual disturbances   Complete by: As directed    Call MD for:  persistant nausea and vomiting   Complete by: As directed    Call MD for:  redness, tenderness, or signs of infection (pain, swelling, redness, odor or green/yellow discharge around incision site)   Complete by: As directed    Call MD for:  severe uncontrolled pain   Complete by: As directed    Call MD for:  temperature >100.4   Complete by: As directed    Diet - low sodium heart healthy   Complete by: As directed    Increase activity slowly   Complete by: As directed    Remove dressing in 48 hours   Complete by: As directed        Follow-up Information    [facility] Follow up.   Specialty: Home Health Services Why: Home Health PT/OT services arranged. They will contact you a few days after discharge to schedule the first visit.  Contact information: [address] [place] [address] [phone]        [facility] Follow up.    Why: Rolling walker to be delivered to the room prior to discharge.  Contact information: [address] [place]  [address] [phone]        [name], MD. Schedule an appointment as soon as possible for a visit in 2 week(s).   Specialty: Neurosurgery Contact information: [address] [place] [address] [place] [address] [phone]                Signed: [name] 04/30/2020, 8:30 AM

## 2020-04-30 NOTE — Plan of Care (Signed)
Patient alert and oriented, mae's well, voiding adequate amount of urine, swallowing without difficulty, no c/o pain at time of discharge. Patient discharged home with family. Script and discharged instructions given to patient. Patient and family stated understanding of instructions given. Patient has an appointment with Dr. Jones °

## 2020-04-30 NOTE — TOC Transition Note (Signed)
Transition of Care [facility]) - CM/SW Discharge Note   Patient Details  Name: [name] MRN: [id number] Date of Birth: 1961/03/15  [facility] CM/SW Contact:  Benard Halsted, LCSW Phone Number: 04/30/2020, 9:37 AM   Clinical Narrative:    CSW made [facility] aware of patient's discharge today. Walker to be delivered to patient's room. No other needs identified.    Final next level of care: [place] Barriers to Discharge: Barriers Resolved   Patient Goals and CMS Choice Patient states their goals for this hospitalization and ongoing recovery are:: Get stronger CMS Medicare.gov Compare Post Acute Care list provided to:: Patient Choice offered to / list presented to : Patient  Discharge Placement                Patient to be transferred to facility by: Car   Patient and family notified of of transfer: 04/30/20  Discharge Plan and Services In-house Referral: Clinical Social Work Discharge Planning Services: CM Consult Post Acute Care Choice: Home Health          DME Arranged: Gilford Rile rolling DME Agency: [employer] Date DME Agency Contacted: 04/29/20 Time DME Agency Contacted: 69 Representative spoke with at DME Agency: [place]: PT, OT [place] Agency: [facility] (formerly [employer]) Date [place]: 04/29/20 Time [place]: 0900 Representative spoke with at [place]: [place] ([place]) Interventions     Readmission Risk Interventions No flowsheet data found.

## 2020-04-30 NOTE — Evaluation (Signed)
Physical Therapy Evaluation Patient Details Name: [name] MRN: [id number] DOB: [birth year] Today's Date: 04/30/2020   History of Present Illness  59 yo male s/p ALIF L4-5 and L5-S1 fusion on 04/28/2020 PMH back pain, depression, HTN, migraines, Blood clots, obesity, OSA, herina repair, knee surgery, radiculopathy  Clinical Impression  Pt was treated and assessed for any functional limitations. Pt verbalized understanding of spinal precautions. He was frustrated with the brace that was given to him stating it was not comfortable. Pt demonstrated increased tolerance for ambulation and stair climbing as evidenced by decreased fatigue and increased ambulation distance. However, he states that pain was a hindrance to continuing on with more functional mobility tasks. Pt has decided to live with his daughter temporarily in place of returning to stay at home alone. Pt is appropriate for skilled PT interventions to address functional limitations, improve safety and independence with functional mobility, and return to PLOF    Follow Up Recommendations Home health PT    Equipment Recommendations  Rolling walker with 5" wheels    Recommendations for Other Services       Precautions / Restrictions Precautions Precautions: Back Precaution Comments: back brace Required Braces or Orthoses: Spinal Brace Spinal Brace: Lumbar corset;Applied in sitting position      Mobility  Bed Mobility Overal bed mobility: Needs Assistance Bed Mobility: Rolling;Sidelying to Sit Rolling: Supervision Sidelying to sit: Supervision       General bed mobility comments: pt hurried with rolling techinque most likely due to pain. pt returned demonstrated proper technique with rolling. showed increased time with transitioning from side lying to sitting.   Transfers Overall transfer level: Needs assistance Equipment used: Rolling walker (2 wheeled) Transfers: Sit to/from Stand Sit to Stand: Min guard          General transfer comment: pt requires increased time with flexed posture to achieve an upright posture. pt needs rest break and taking deep breaths with transfer  Ambulation/Gait Ambulation/Gait assistance: Min guard Gait Distance (Feet): 175 Feet Assistive device: Rolling walker (2 wheeled) Gait Pattern/deviations: Step-through pattern Gait velocity: decreased  Gait velocity interpretation: <1.31 ft/sec, indicative of household ambulator General Gait Details: decreased swing phase in gait, cued pt to stay central to walker while ambulating. pt was also cued to relax the shoulders while ambulating and educated on proper walker technique by ambulating with fluid advancement of walker  Stairs Stairs: Yes Stairs assistance: Min guard Stair Management: One rail Right Number of Stairs: 10 General stair comments: step to gait pattern; patient demonstrated more difficulty with initial stair climbing, pt wanted to grab railing with contralateral hand and was educated not to rotate as to observe spinal precautions   Wheelchair Mobility    Modified Rankin (Stroke Patients Only)       Balance Overall balance assessment: Needs assistance Sitting-balance support: No upper extremity supported;Feet supported Sitting balance-Leahy Scale: Fair     Standing balance support: During functional activity;Single extremity supported Standing balance-Leahy Scale: Fair Standing balance comment: pt was able to maintain standing position for 15-30 seconds while walker height was readjusted, pt had LOB and regained it while walking                             Pertinent Vitals/Pain Pain Assessment: 0-10 Pain Score: 9  Pain Location: back Pain Descriptors / Indicators: Discomfort;Grimacing Pain Intervention(s): Limited activity within patient's tolerance;Monitored during session;Repositioned    Home Living  Prior Function                 Hand  Dominance        Extremity/Trunk Assessment                Communication      Cognition Arousal/Alertness: Awake/alert Behavior During Therapy: WFL for tasks assessed/performed Overall Cognitive Status: Within Functional Limits for tasks assessed                                        General Comments      Exercises     Assessment/Plan    PT Assessment    PT Problem List         PT Treatment Interventions      PT Goals (Current goals can be found in the Care Plan section)  Acute Rehab PT Goals Patient Stated Goal: to get this pain to stop PT Goal Formulation: With patient Time For Goal Achievement: 05/06/20 Potential to Achieve Goals: Good    Frequency Min 5X/week   Barriers to discharge        Co-evaluation               AM-PAC PT "6 Clicks" Mobility  Outcome Measure Help needed turning from your back to your side while in a flat bed without using bedrails?: None Help needed moving from lying on your back to sitting on the side of a flat bed without using bedrails?: None Help needed moving to and from a bed to a chair (including a wheelchair)?: A Little Help needed standing up from a chair using your arms (e.g., wheelchair or bedside chair)?: A Little Help needed to walk in hospital room?: A Little Help needed climbing 3-5 steps with a railing? : A Little 6 Click Score: 20    End of Session Equipment Utilized During Treatment: Gait belt;Back brace Activity Tolerance: Patient limited by fatigue;Patient limited by pain Patient left: in bed;with call Annalysia [place]en[place]/phone within reach;with bed alarm set   PT Visit Diagnosis: Pain;Difficulty in walking, not elsewhere classified (R26.2);Unsteadiness on feet (R26.81) Pain - part of body:  (back)    Time: 2633-3545 PT Time Calculation (min) (ACUTE ONLY): 43 min   Charges:     PT Treatments $Gait Training: 38-52 mins      Gloriann Loan, SPT  Acute Rehabilitation Services  Office:  [phone]  04/30/2020, 2:48 PM

## 2020-04-30 NOTE — Progress Notes (Signed)
Occupational Therapy Treatment Patient Details Name: [name] MRN: [id number] DOB: Jan 22, 1961 Today's Date: 04/30/2020    History of present illness 59 yo male s/p ALIF L4-5 and L5-S1 fusion on 04/28/2020 PMH back pain, depression, HTN, migraines, Blood clots, obesity, OSA, herina repair, knee surgery, radiculopathy   OT comments  Pt with poor recall of precautions and bed mobility sequence. Pt needs increased time for all transfers. Pt reports planning to d/c to daughters home. Pt reports feeling ready for home but fatigued from medication. Pt demonstrates cognitive deficits this session with poor recall and not recognizing staff including Dr Ronnald Ramp.   Follow Up Recommendations  Home health OT;Other (comment)    Equipment Recommendations  Other (comment)    Recommendations for Other Services      Precautions / Restrictions Precautions Precautions: Back Precaution Comments: back brace Required Braces or Orthoses: Spinal Brace Spinal Brace: Lumbar corset;Applied in sitting position       Mobility Bed Mobility Overal bed mobility: Needs Assistance Bed Mobility: Sit to Supine;Supine to Sit Rolling: Min guard   Supine to sit: Min guard Sit to supine: Min guard   General bed mobility comments: pt requires mod cues for back precautions . pt with poor return demo with supine to sit. pt pulling up trunk into a long sit with bil LE off bed surface. pt again educated prior to transfer and during that side to sit could return pain. pt states "oh yeah next time" pt needs increased time and use of bed rail to complete sit to supine positioning. pt reports increased pain. pt demonstrates bridge with pain to position himself in the bed  Transfers Overall transfer level: Needs assistance Equipment used: Rolling walker (2 wheeled) Transfers: Sit to/from Stand Sit to Stand: Min guard         General transfer comment: pt requires increased time with flexed posture to achieve an  upright posture. pt needs rest break and taking deep breaths with transfer    Balance Overall balance assessment: Needs assistance Sitting-balance support: No upper extremity supported;Feet supported Sitting balance-Leahy Scale: Fair     Standing balance support: During functional activity;Single extremity supported Standing balance-Leahy Scale: Fair Standing balance comment: reliant on external supports                           ADL either performed or assessed with clinical judgement   ADL Overall ADL's : Needs assistance/impaired     Grooming: Supervision/safety;Standing Grooming Details (indicate cue type and reason): good return demo of cup use to keep precautions. pt reports "that feels better"                         Tub/ Shower Transfer: Psychologist, forensic Details (indicate cue type and reason): educated on bathroom transfer. pt plans to sponge bath until he returns home from daughters home Functional mobility during ADLs: Min guard;Rolling walker General ADL Comments: pt requires cues for bed mobility with poor return demo. pt requires reinforcement of precautions during transfers     Vision       Perception     Praxis      Cognition Arousal/Alertness: Awake/alert Behavior During Therapy: WFL for tasks assessed/performed Overall Cognitive Status: Impaired/Different from baseline Area of Impairment: Memory                     Memory: Decreased recall of precautions;Decreased short-term memory  General Comments: pt states "i didnt know who that was at first until he started to talk to me more" pt unable to recall Dr Ronnald Ramp. pt states "i know you from somewhere" pt with poor recall of therapist arriving the day prior or session events. Pt recalling bending and twisting precautions. pt educated again on lifting. pt state "i would have never got that in a million years" pt reports fatigue from medicaiton  and continued pain.         Exercises     Shoulder Instructions       General Comments dressing intact anterior and posterior at this time    Pertinent Vitals/ Pain       Pain Assessment: 0-10 Pain Score: 9  Pain Location: pain all over Pain Descriptors / Indicators: Discomfort;Operative site guarding Pain Intervention(s): Monitored during session;Repositioned;Premedicated before session  Home Living                                          Prior Functioning/Environment              Frequency  Min 2X/week        Progress Toward Goals  OT Goals(current goals can now be found in the care plan section)  Progress towards OT goals: Progressing toward goals  Acute Rehab OT Goals Patient Stated Goal: to get this pain to stop OT Goal Formulation: With patient Time For Goal Achievement: 05/13/20 Potential to Achieve Goals: Good ADL Goals Pt Will Perform Lower Body Dressing: with modified independence;sit to/from stand;with adaptive equipment Pt Will Transfer to Toilet: with modified independence;ambulating;regular height toilet Pt Will Perform Tub/Shower Transfer: Shower transfer;ambulating;rolling walker;with modified independence  Plan Discharge plan remains appropriate    Co-evaluation                 AM-PAC OT "6 Clicks" Daily Activity     Outcome Measure   Help from another person eating meals?: None Help from another person taking care of personal grooming?: None Help from another person toileting, which includes using toliet, bedpan, or urinal?: A Little Help from another person bathing (including washing, rinsing, drying)?: A Little Help from another person to put on and taking off regular upper body clothing?: None Help from another person to put on and taking off regular lower body clothing?: A Little 6 Click Score: 21    End of Session Equipment Utilized During Treatment: Rolling walker;Back brace  OT Visit Diagnosis:  Unsteadiness on feet (R26.81);Pain   Activity Tolerance Patient tolerated treatment well   Patient Left in bed;with call bell/phone within reach   Nurse Communication Mobility status;Precautions        Time: 5427-0623 OT Time Calculation (min): 20 min  Charges: OT General Charges $OT Visit: 1 Visit OT Treatments $Self Care/Home Management : 8-22 mins   Brynn, OTR/L  Acute Rehabilitation Services Pager: [phone] Office: [phone] .    Jeri Modena 04/30/2020, 9:27 AM

## 2020-05-01 MED FILL — Heparin Sodium (Porcine) Inj 1000 Unit/ML: INTRAMUSCULAR | Qty: 30 | Status: AC

## 2020-05-01 MED FILL — Sodium Chloride IV Soln 0.9%: INTRAVENOUS | Qty: 1000 | Status: AC

## 2020-05-02 ENCOUNTER — Telehealth: Payer: Self-pay | Admitting: Family Medicine

## 2020-05-02 MED ORDER — AMLODIPINE BESYLATE 5 MG PO TABS: 5.0000 mg | ORAL_TABLET | Freq: Every day | ORAL | 0 refills | Status: DC

## 2020-05-02 NOTE — Telephone Encounter (Signed)
Patient is needing a refill on amLODipine (NORVASC) 5 MG tablet . He is completely out.  Patient had appt in April and isn't due for his 6 mos f/u until October.  Patient just had back surgery this week so can't drive.  Wants refills sent to [facility] in [place], [place] [address], so that his daughter can bring them to him.  Patient will call back to schedule October appt.  Was in a lot of pain at time of call from surgery.

## 2020-05-02 NOTE — Telephone Encounter (Signed)
Pt.notified

## 2020-05-02 NOTE — Telephone Encounter (Signed)
Please advise. Thank you

## 2020-05-13 DIAGNOSIS — M4316 Spondylolisthesis, lumbar region: Secondary | ICD-10-CM | POA: Diagnosis not present

## 2020-05-14 ENCOUNTER — Other Ambulatory Visit (HOSPITAL_COMMUNITY): Payer: Self-pay | Admitting: Neurological Surgery

## 2020-05-14 ENCOUNTER — Other Ambulatory Visit: Payer: Self-pay

## 2020-05-14 ENCOUNTER — Ambulatory Visit (HOSPITAL_COMMUNITY)
Admission: RE | Admit: 2020-05-14 | Discharge: 2020-05-14 | Disposition: A | Discharge status: Home / Self Care | Payer: Self-pay | Source: Ambulatory Visit | Attending: Vascular Surgery | Admitting: Vascular Surgery

## 2020-05-14 DIAGNOSIS — M7989 Other specified soft tissue disorders: Secondary | ICD-10-CM | POA: Insufficient documentation

## 2020-05-14 NOTE — Progress Notes (Signed)
Venous duplex completed. Called results to [facility] at Dr. Ronnald Ramp' office. Left voice message.

## 2020-05-16 ENCOUNTER — Encounter (HOSPITAL_COMMUNITY): Payer: Self-pay

## 2020-05-30 DIAGNOSIS — G4730 Sleep apnea, unspecified: Secondary | ICD-10-CM | POA: Diagnosis not present

## 2020-06-30 DIAGNOSIS — G4730 Sleep apnea, unspecified: Secondary | ICD-10-CM | POA: Diagnosis not present

## 2020-07-17 ENCOUNTER — Other Ambulatory Visit: Payer: Self-pay | Admitting: *Deleted

## 2020-07-17 MED ORDER — AMLODIPINE BESYLATE 5 MG PO TABS: 5.0000 mg | ORAL_TABLET | Freq: Every day | ORAL | 0 refills | Status: DC

## 2020-07-29 DIAGNOSIS — M4316 Spondylolisthesis, lumbar region: Secondary | ICD-10-CM | POA: Diagnosis not present

## 2020-07-30 DIAGNOSIS — G4730 Sleep apnea, unspecified: Secondary | ICD-10-CM | POA: Diagnosis not present

## 2020-08-15 ENCOUNTER — Other Ambulatory Visit: Payer: Self-pay | Admitting: Family Medicine

## 2020-08-19 NOTE — Telephone Encounter (Signed)
Patient scheduled medication follow up for 10/18

## 2020-08-21 DIAGNOSIS — M545 Low back pain, unspecified: Secondary | ICD-10-CM | POA: Diagnosis not present

## 2020-08-26 ENCOUNTER — Other Ambulatory Visit: Payer: Self-pay | Admitting: Neurological Surgery

## 2020-08-26 DIAGNOSIS — M545 Low back pain, unspecified: Secondary | ICD-10-CM

## 2020-08-30 DIAGNOSIS — G4730 Sleep apnea, unspecified: Secondary | ICD-10-CM | POA: Diagnosis not present

## 2020-09-04 ENCOUNTER — Other Ambulatory Visit: Payer: Self-pay

## 2020-09-04 ENCOUNTER — Encounter: Payer: Self-pay | Admitting: Family Medicine

## 2020-09-04 ENCOUNTER — Ambulatory Visit (INDEPENDENT_AMBULATORY_CARE_PROVIDER_SITE_OTHER): Payer: BC Managed Care – PPO | Admitting: Family Medicine

## 2020-09-04 VITALS — BP 182/110 | HR 107 | Temp 97.3°F | Ht 72.0 in | Wt 214.8 lb

## 2020-09-04 DIAGNOSIS — Z9889 Other specified postprocedural states: Secondary | ICD-10-CM | POA: Diagnosis not present

## 2020-09-04 DIAGNOSIS — I120 Hypertensive chronic kidney disease with stage 5 chronic kidney disease or end stage renal disease: Secondary | ICD-10-CM

## 2020-09-04 DIAGNOSIS — N185 Chronic kidney disease, stage 5: Secondary | ICD-10-CM

## 2020-09-04 DIAGNOSIS — I10 Essential (primary) hypertension: Secondary | ICD-10-CM

## 2020-09-04 DIAGNOSIS — F321 Major depressive disorder, single episode, moderate: Secondary | ICD-10-CM | POA: Diagnosis not present

## 2020-09-04 MED ORDER — HYDRALAZINE HCL 50 MG PO TABS: ORAL_TABLET | ORAL | 0 refills | Status: AC

## 2020-09-04 MED ORDER — FLUOXETINE HCL 20 MG PO TABS: 40.0000 mg | ORAL_TABLET | Freq: Every day | ORAL | 2 refills | Status: AC

## 2020-09-04 MED ORDER — AMLODIPINE BESYLATE 5 MG PO TABS: 5.0000 mg | ORAL_TABLET | Freq: Every day | ORAL | 0 refills | Status: AC

## 2020-09-04 NOTE — Progress Notes (Signed)
Patient ID: [name], male    DOB: [birth year], 59 y.o.   MRN: [id number]   Chief Complaint  Patient presents with  . Hypertension   Subjective:    HPI Pt here to establish care and get medication follow up. Pt had back surgery in July.  Had surgery in 7/21, pt in severe pain while in room with his back and rocking in the chair.  Pt stating seeing his surgeon regularly for follow up. Pt did not get his labs from last visit in 4/21 and not understanding why he needed to get them for his refills. Pt just laughing in room when I advised pt he needed to get his lab work before more bp medications in order to check his kidney function.  Pt has stage 5 ckd, and very concerned that things needing to be rechecked. Last labs in 7/21.   Pt was given script on 08/19/20 for norvasc 31m, and pt never picked it up.     Medical History BKingstenhas a past medical history of Back pain, Complication of anesthesia, Depression, H/O blood clots (2008), Hypertension, Migraine, Obesity, PONV (postoperative nausea and vomiting), Radiculopathy, Sleep apnea, and Spondylolisthesis.   Outpatient Encounter Medications as of 09/04/2020  Medication Sig  . amLODipine (NORVASC) 5 MG tablet Take 1 tablet (5 mg total) by mouth daily.  .Marland KitchenFLUoxetine (PROZAC) 20 MG tablet Take 2 tablets (40 mg total) by mouth daily.  . hydrALAZINE (APRESOLINE) 50 MG tablet TAKE 1 AND 1/2 TABLETS BY MOUTH THREE TIMES DAILY  . methocarbamol (ROBAXIN) 500 MG tablet Take 1 tablet (500 mg total) by mouth every 6 (six) hours as needed for muscle spasms.  . Oxycodone HCl 10 MG TABS Take 1 tablet (10 mg total) by mouth every 4 (four) hours as needed ((score 7 to 10)).  .Marland KitchenUNKNOWN TO PATIENT Inject as directed every 3 (three) months. INJECTIONS for MIGRAINES [facility] . [DISCONTINUED] ALPRAZolam (Duanne Moron 1 MG tablet Take one half to one qhs prn sleep (Patient taking  differently: Take 0.5-1 mg by mouth at bedtime as needed for anxiety or sleep. )  . [DISCONTINUED] amLODipine (NORVASC) 5 MG tablet TAKE 1 TABLET(5 MG) BY MOUTH DAILY  . [DISCONTINUED] FLUoxetine (PROZAC) 20 MG tablet Take 40 mg by mouth daily.   . [DISCONTINUED] hydrALAZINE (APRESOLINE) 50 MG tablet TAKE 1 AND 1/2 TABLETS BY MOUTH THREE TIMES DAILY  . [DISCONTINUED] aspirin EC 81 MG EC tablet Take 1 tablet (81 mg total) by mouth daily.  . [DISCONTINUED] celecoxib (CELEBREX) 200 MG capsule Take 1 capsule (200 mg total) by mouth every 12 (twelve) hours.   No facility-administered encounter medications on file as of 09/04/2020.     Review of Systems  Constitutional: Negative for chills and fever.  HENT: Negative for congestion, rhinorrhea and sore throat.   Respiratory: Negative for cough, shortness of breath and wheezing.   Cardiovascular: Negative for chest pain and leg swelling.  Gastrointestinal: Negative for abdominal pain, diarrhea, nausea and vomiting.  Genitourinary: Negative for dysuria and frequency.  Musculoskeletal: Positive for back pain (chronic back pain, recent surgery 7/21).  Skin: Negative for rash.  Neurological: Negative for dizziness, weakness and headaches.     Vitals BP (!) 182/110   Pulse (!) 107   Temp (!) 97.3 F (36.3 C)   Ht 6' (1.829 m)   Wt 214 lb 12.8 oz (97.4 kg)   SpO2 95%  BMI 29.13 kg/m   Objective:   Physical Exam Vitals and nursing note reviewed.  Constitutional:      General: He is in acute distress.     Appearance: Normal appearance. He is not ill-appearing.  Cardiovascular:     Rate and Rhythm: Regular rhythm. Tachycardia present.     Pulses: Normal pulses.     Heart sounds: No murmur heard.   Pulmonary:     Effort: Pulmonary effort is normal.     Breath sounds: Normal breath sounds.  Musculoskeletal:        General: Normal range of motion.     Comments: +chronic back pain from his surgery.  Skin:    General: Skin is warm  and dry.  Neurological:     Mental Status: He is alert and oriented to person, place, and time.     Cranial Nerves: No cranial nerve deficit.     Motor: No weakness.  Psychiatric:        Mood and Affect: Mood normal.        Behavior: Behavior normal.      Assessment and Plan   1. Essential hypertension - CBC - CMP14+EGFR - Lipid panel  2. History of lumbar surgery  3. Hypertensive kidney disease with CKD (chronic kidney disease) stage V ([place])  4. Current moderate episode of major depressive disorder without prior episode ([place])   htn- uncontrolled.  No chest pain, HA, dizziness, or leg swelling. Pt in pain in chair in office and likely increasing his BP, we called in script for his norvasc 41m on 08/19/20, pt never picked it up.  Hasn't been taking either one of his bp meds.  Stating he ran out and "couldn't get it."  Pt advised needing to go next door to get his lab work before getting more blood pressure medications.  Will give him 30 days worth since his bp is very high.  Pt voiced understanding.  advising unable to prescribe that dose of xanax for his anxiety, we are able to prescribe the prozac and would he like referral to psychiatry he declined at this time.  Pt needing to get labs before more refills. Pt voiced understanding.  F/u [id number] prn.

## 2020-09-10 ENCOUNTER — Other Ambulatory Visit: Payer: Self-pay

## 2020-09-25 ENCOUNTER — Telehealth: Payer: Self-pay | Admitting: *Deleted

## 2020-09-25 NOTE — Telephone Encounter (Signed)
[name], [name], DO  [facility] Pls call pt to see when can get labs, in computer, pt supposed ot get them last visit.   Need this before getting more refills.

## 2020-09-25 NOTE — Telephone Encounter (Signed)
Telephone call- voicemail full

## 2020-09-26 NOTE — Telephone Encounter (Signed)
Tried to call voicemail is full

## 2020-09-29 DIAGNOSIS — G4730 Sleep apnea, unspecified: Secondary | ICD-10-CM | POA: Diagnosis not present

## 2020-09-29 NOTE — Telephone Encounter (Signed)
Tc- VM full.

## 2020-10-01 ENCOUNTER — Other Ambulatory Visit: Payer: Self-pay

## 2020-10-01 ENCOUNTER — Ambulatory Visit
Admission: RE | Admit: 2020-10-01 | Discharge: 2020-10-01 | Disposition: A | Discharge status: Home / Self Care | Payer: BC Managed Care – PPO | Source: Ambulatory Visit | Attending: Neurological Surgery | Admitting: Neurological Surgery

## 2020-10-01 DIAGNOSIS — M545 Low back pain, unspecified: Secondary | ICD-10-CM | POA: Diagnosis not present

## 2020-10-02 DIAGNOSIS — M545 Low back pain, unspecified: Secondary | ICD-10-CM | POA: Diagnosis not present

## 2020-10-06 ENCOUNTER — Telehealth: Payer: Self-pay | Admitting: Family Medicine

## 2020-10-06 NOTE — Telephone Encounter (Addendum)
See 10/06/2020 telephone message from Dr Lovena Le

## 2020-10-06 NOTE — Telephone Encounter (Signed)
Telephone call- voicemail is full

## 2020-10-07 ENCOUNTER — Encounter: Payer: Self-pay | Admitting: Family Medicine

## 2020-10-07 ENCOUNTER — Encounter (HOSPITAL_COMMUNITY): Payer: Self-pay | Admitting: Physical Therapy

## 2020-10-07 NOTE — Telephone Encounter (Signed)
Erven Colla, DO   Gave letter to Danae Chen to put on letterhead and send by certified and regular mail to pt about labs and dismissal if labs not completed by 11/07/20.

## 2020-10-14 ENCOUNTER — Other Ambulatory Visit: Payer: Self-pay | Admitting: Family Medicine

## 2020-10-14 NOTE — Telephone Encounter (Signed)
Last check up 09/04/20

## 2020-10-19 DIAGNOSIS — J9601 Acute respiratory failure with hypoxia: Secondary | ICD-10-CM | POA: Diagnosis not present

## 2020-10-27 ENCOUNTER — Telehealth: Payer: Self-pay | Admitting: Family Medicine

## 2020-10-27 NOTE — Telephone Encounter (Signed)
FYIMarzetta Board, RN Case Manager from [employer] to see if pt has any follow up appt or plan of care established. Informed Marzetta Board that pt was last seen on 09/04/20 and that after that we have been unable to get in touch with him. Marzetta Board states that if we need any thing from her as far as case management feel free to give her a call.  [phone] S:[phone]

## 2020-10-27 NOTE — Telephone Encounter (Signed)
Pt will not return our calls either, pt non-compliant on getting his labs, and was given dismissal letter, that if he didn't get his labs by 11/01/20 then he would be dismissed from the practice for non-compliance.   Thx.   Dr. Lovena Le

## 2020-10-30 DIAGNOSIS — G4730 Sleep apnea, unspecified: Secondary | ICD-10-CM | POA: Diagnosis not present

## 2020-11-23 ENCOUNTER — Other Ambulatory Visit: Payer: Self-pay | Admitting: Family Medicine

## 2020-11-24 NOTE — Telephone Encounter (Signed)
Pt was dismissed for non-compliance.  I am no longer the pcp.  Thx. Dr. Lovena Le

## 2020-12-19 ENCOUNTER — Other Ambulatory Visit: Payer: Self-pay | Admitting: Family Medicine

## 2021-02-02 DIAGNOSIS — Z029 Encounter for administrative examinations, unspecified: Secondary | ICD-10-CM
